# Patient Record
Sex: Female | Born: 1950 | Race: White | Hispanic: No | Marital: Married | Smoking: Former smoker
Health system: Southern US, Community
[De-identification: ages and names within clinical notes are randomized; demographics above are authoritative.]

## PROBLEM LIST (undated history)

## (undated) DIAGNOSIS — Z8719 Personal history of other diseases of the digestive system: Secondary | ICD-10-CM

## (undated) DIAGNOSIS — M25519 Pain in unspecified shoulder: Secondary | ICD-10-CM

## (undated) DIAGNOSIS — I609 Nontraumatic subarachnoid hemorrhage, unspecified: Secondary | ICD-10-CM

## (undated) DIAGNOSIS — R569 Unspecified convulsions: Secondary | ICD-10-CM

## (undated) DIAGNOSIS — F1010 Alcohol abuse, uncomplicated: Secondary | ICD-10-CM

## (undated) DIAGNOSIS — F329 Major depressive disorder, single episode, unspecified: Secondary | ICD-10-CM

## (undated) DIAGNOSIS — I639 Cerebral infarction, unspecified: Secondary | ICD-10-CM

## (undated) DIAGNOSIS — M109 Gout, unspecified: Secondary | ICD-10-CM

## (undated) DIAGNOSIS — F410 Panic disorder [episodic paroxysmal anxiety] without agoraphobia: Secondary | ICD-10-CM

## (undated) DIAGNOSIS — I6990 Unspecified sequelae of unspecified cerebrovascular disease: Secondary | ICD-10-CM

## (undated) DIAGNOSIS — M545 Low back pain, unspecified: Secondary | ICD-10-CM

## (undated) DIAGNOSIS — S22080A Wedge compression fracture of T11-T12 vertebra, initial encounter for closed fracture: Secondary | ICD-10-CM

## (undated) DIAGNOSIS — I480 Paroxysmal atrial fibrillation: Secondary | ICD-10-CM

## (undated) DIAGNOSIS — R609 Edema, unspecified: Secondary | ICD-10-CM

## (undated) DIAGNOSIS — G8929 Other chronic pain: Secondary | ICD-10-CM

## (undated) DIAGNOSIS — F321 Major depressive disorder, single episode, moderate: Secondary | ICD-10-CM

## (undated) DIAGNOSIS — M1990 Unspecified osteoarthritis, unspecified site: Secondary | ICD-10-CM

## (undated) DIAGNOSIS — I10 Essential (primary) hypertension: Secondary | ICD-10-CM

## (undated) DIAGNOSIS — R102 Pelvic and perineal pain: Secondary | ICD-10-CM

## (undated) DIAGNOSIS — G4700 Insomnia, unspecified: Secondary | ICD-10-CM

## (undated) DIAGNOSIS — M549 Dorsalgia, unspecified: Secondary | ICD-10-CM

## (undated) DIAGNOSIS — M48061 Spinal stenosis, lumbar region without neurogenic claudication: Secondary | ICD-10-CM

## (undated) DIAGNOSIS — M25569 Pain in unspecified knee: Secondary | ICD-10-CM

## (undated) DIAGNOSIS — I69351 Hemiplegia and hemiparesis following cerebral infarction affecting right dominant side: Secondary | ICD-10-CM

## (undated) DIAGNOSIS — F419 Anxiety disorder, unspecified: Secondary | ICD-10-CM

## (undated) DIAGNOSIS — I619 Nontraumatic intracerebral hemorrhage, unspecified: Secondary | ICD-10-CM

## (undated) DIAGNOSIS — I509 Heart failure, unspecified: Secondary | ICD-10-CM

## (undated) HISTORY — PX: EYE SURGERY: SHX253

## (undated) HISTORY — DX: Edema, unspecified: R60.9

## (undated) HISTORY — PX: CHOLECYSTECTOMY: SHX55

## (undated) HISTORY — DX: Alcohol abuse, uncomplicated: F10.10

## (undated) HISTORY — DX: Panic disorder (episodic paroxysmal anxiety): F41.0

## (undated) HISTORY — DX: Unspecified sequelae of unspecified cerebrovascular disease: I69.90

## (undated) HISTORY — DX: Hemiplegia and hemiparesis following cerebral infarction affecting right dominant side: I69.351

## (undated) HISTORY — DX: Essential (primary) hypertension: I10

## (undated) HISTORY — DX: Nontraumatic subarachnoid hemorrhage, unspecified: I60.9

## (undated) HISTORY — DX: Low back pain, unspecified: M54.50

## (undated) HISTORY — DX: Personal history of other diseases of the digestive system: Z87.19

## (undated) HISTORY — DX: Pain in unspecified shoulder: M25.519

## (undated) HISTORY — PX: OTHER SURGICAL HISTORY: SHX169

## (undated) HISTORY — PX: GASTRIC BYPASS: SHX52

## (undated) HISTORY — DX: Gout, unspecified: M10.9

## (undated) HISTORY — DX: Major depressive disorder, single episode, moderate: F32.1

## (undated) HISTORY — DX: Major depressive disorder, single episode, unspecified: F32.9

## (undated) HISTORY — PX: BARIATRIC SURGERY: SHX1103

## (undated) HISTORY — DX: Insomnia, unspecified: G47.00

## (undated) HISTORY — DX: Heart failure, unspecified: I50.9

## (undated) HISTORY — PX: COLONOSCOPY: SHX174

## (undated) HISTORY — DX: Unspecified osteoarthritis, unspecified site: M19.90

---
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
## 2007-03-18 ENCOUNTER — Ambulatory Visit (HOSPITAL_COMMUNITY): Admission: RE | Admit: 2007-03-18 | Discharge: 2007-03-18 | Payer: Self-pay | Admitting: Pulmonary Disease

## 2007-04-19 ENCOUNTER — Ambulatory Visit (HOSPITAL_COMMUNITY): Admission: RE | Admit: 2007-04-19 | Discharge: 2007-04-19 | Payer: Self-pay | Admitting: Pulmonary Disease

## 2007-05-28 ENCOUNTER — Ambulatory Visit: Payer: Self-pay | Admitting: Urgent Care

## 2007-06-05 ENCOUNTER — Ambulatory Visit (HOSPITAL_COMMUNITY): Admission: RE | Admit: 2007-06-05 | Discharge: 2007-06-05 | Payer: Self-pay | Admitting: Pulmonary Disease

## 2007-06-11 ENCOUNTER — Encounter: Payer: Self-pay | Admitting: Gastroenterology

## 2007-06-11 ENCOUNTER — Ambulatory Visit (HOSPITAL_COMMUNITY): Admission: RE | Admit: 2007-06-11 | Discharge: 2007-06-11 | Payer: Self-pay | Admitting: Gastroenterology

## 2007-06-11 ENCOUNTER — Ambulatory Visit: Payer: Self-pay | Admitting: Gastroenterology

## 2007-08-05 ENCOUNTER — Ambulatory Visit (HOSPITAL_COMMUNITY): Admission: RE | Admit: 2007-08-05 | Discharge: 2007-08-05 | Payer: Self-pay

## 2007-08-13 ENCOUNTER — Ambulatory Visit (HOSPITAL_COMMUNITY): Admission: RE | Admit: 2007-08-13 | Discharge: 2007-08-13 | Payer: Self-pay

## 2008-03-24 ENCOUNTER — Ambulatory Visit (HOSPITAL_COMMUNITY): Admission: RE | Admit: 2008-03-24 | Discharge: 2008-03-24 | Payer: Self-pay | Admitting: Pulmonary Disease

## 2008-03-27 ENCOUNTER — Encounter: Payer: Self-pay | Admitting: Cardiology

## 2008-03-27 ENCOUNTER — Ambulatory Visit (HOSPITAL_COMMUNITY): Admission: RE | Admit: 2008-03-27 | Discharge: 2008-03-27 | Payer: Self-pay | Admitting: Pulmonary Disease

## 2008-03-28 ENCOUNTER — Ambulatory Visit: Payer: Self-pay | Admitting: Cardiology

## 2008-03-30 ENCOUNTER — Encounter: Payer: Self-pay | Admitting: Cardiology

## 2008-03-31 ENCOUNTER — Ambulatory Visit: Payer: Self-pay | Admitting: Cardiology

## 2008-03-31 ENCOUNTER — Inpatient Hospital Stay (HOSPITAL_COMMUNITY): Admission: AD | Admit: 2008-03-31 | Discharge: 2008-04-04 | Payer: Self-pay | Admitting: Cardiology

## 2008-04-02 ENCOUNTER — Encounter: Payer: Self-pay | Admitting: Cardiology

## 2008-04-03 ENCOUNTER — Encounter: Payer: Self-pay | Admitting: Cardiology

## 2008-04-15 ENCOUNTER — Ambulatory Visit: Payer: Self-pay | Admitting: Cardiology

## 2009-01-25 ENCOUNTER — Telehealth: Payer: Self-pay | Admitting: Gastroenterology

## 2009-07-07 DIAGNOSIS — I10 Essential (primary) hypertension: Secondary | ICD-10-CM | POA: Insufficient documentation

## 2009-07-07 DIAGNOSIS — E875 Hyperkalemia: Secondary | ICD-10-CM

## 2009-07-07 DIAGNOSIS — I5032 Chronic diastolic (congestive) heart failure: Secondary | ICD-10-CM

## 2009-07-07 DIAGNOSIS — R0602 Shortness of breath: Secondary | ICD-10-CM | POA: Insufficient documentation

## 2009-07-07 DIAGNOSIS — I498 Other specified cardiac arrhythmias: Secondary | ICD-10-CM | POA: Insufficient documentation

## 2009-11-18 ENCOUNTER — Ambulatory Visit (HOSPITAL_COMMUNITY): Admission: RE | Admit: 2009-11-18 | Discharge: 2009-11-18 | Payer: Self-pay | Admitting: Pulmonary Disease

## 2009-11-19 LAB — HM MAMMOGRAPHY

## 2010-02-04 ENCOUNTER — Encounter: Payer: Self-pay | Admitting: Cardiology

## 2010-02-17 ENCOUNTER — Encounter: Payer: Self-pay | Admitting: Cardiology

## 2010-02-25 ENCOUNTER — Ambulatory Visit (HOSPITAL_COMMUNITY): Admission: RE | Admit: 2010-02-25 | Discharge: 2010-02-25 | Payer: Self-pay | Admitting: Pulmonary Disease

## 2010-03-10 ENCOUNTER — Ambulatory Visit (HOSPITAL_COMMUNITY): Admission: RE | Admit: 2010-03-10 | Discharge: 2010-03-10 | Payer: Self-pay | Admitting: Pulmonary Disease

## 2010-04-15 ENCOUNTER — Encounter: Payer: Self-pay | Admitting: Cardiology

## 2010-05-24 ENCOUNTER — Telehealth (INDEPENDENT_AMBULATORY_CARE_PROVIDER_SITE_OTHER): Payer: Self-pay | Admitting: *Deleted

## 2010-07-27 ENCOUNTER — Encounter: Payer: Self-pay | Admitting: Cardiology

## 2010-11-06 ENCOUNTER — Encounter: Payer: Self-pay | Admitting: Pulmonary Disease

## 2010-11-16 NOTE — Cardiovascular Report (Signed)
Summary: Cardiac Catheterization  Cardiac Catheterization   Imported By: Dorise Hiss 07/27/2010 14:45:33  _____________________________________________________________________  External Attachment:    Type:   Image     Comment:   External Document

## 2010-11-16 NOTE — Consult Note (Signed)
Summary: CARDIOLOGY CONSULT/ [facility]  CARDIOLOGY CONSULT/ [facility]   Imported By: Zachary George 07/26/2010 16:40:40  _____________________________________________________________________  External Attachment:    Type:   Image     Comment:   External Document

## 2010-11-16 NOTE — Progress Notes (Signed)
Summary: C/O SOB  Phone Note Call from Patient Call back at Home Phone [phone]   Caller: Patient Summary of Call: Message left on nurse voicmail that patient has been having some inceased SOB and wanted to see Degent only since she saw him at hospital. Nurse called patient back and informed her that MD was out of office and she should f/u with her PCP. Patient informed nurse that her PCP,Dr. 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 Nurse informed her to follow PCP instructions and informed her if no improvement to proceed to ED for evaluation. Appointment also given to patient to see MD.  . Initial call taken by: Carlye Grippe,  May 24, 2010 4:11 PM

## 2010-11-16 NOTE — Letter (Signed)
Summary: Appointment -missed  [facility] [address]   [place], [place] [address]   Phone: [phone]  Fax: [phone]     July 27, 2010 MRN: [id number]     [name] [address] [place], [place]  [address]     Dear Ms. Luan Pulling,  Our records indicate you missed your appointment on July 27, 2010                        with Dr.  Andee Lineman.   It is very important that we reach you to reschedule this appointment. We look forward to participating in your health care needs.   Please contact us at the number listed above at your earliest convenience to reschedule this appointment.   Sincerely,    Glass blower/designer

## 2011-02-28 NOTE — Discharge Summary (Signed)
[name], [name] NO.:  [id number]   MEDICAL RECORD NO.:  [id number]          PATIENT TYPE:  INP   LOCATION:  4735                         FACILITY:  [facility]   PHYSICIAN:  Madolyn Frieze. Jens Som, MD, FACCDATE OF BIRTH:  05-Dec-1950   DATE OF ADMISSION:  03/31/2008  DATE OF DISCHARGE:  04/03/2008                               DISCHARGE SUMMARY   PRIMARY CARDIOLOGIST:  Learta Codding, MD,FACC.   DISCHARGE DIAGNOSIS:  Acute on chronic diastolic congestive heart  failure.   SECONDARY DIAGNOSES:  1. Acute on chronic iron deficiency anemia requiring blood transfusion      at [facility] prior to transfer to [facility].  2. Hypertension.  3. Normal coronary arteries by cardiac catheterization this admission.  4. Normal left ventricular systolic function.  5. Asymptomatic bradycardia with discontinuation of beta-blocker      therapy this admission.  6. Depression.  7. Status post gastric bypass surgery.  8. Hypokalemia which has been treated this admission.   ALLERGIES:  No known drug allergies.   PROCEDURES:  Right and left heart cardiac catheterization revealing  normal coronary arteries with normal right heart pressures and mildly  elevated wedge.  Pulmonary artery pressure 34/13 (22), pulmonary  capillary wedge pressure 15, RA 11/6 (6), RV 36/1 (13), cardiac output  5.3, cardiac index 2.64.   HISTORY OF PRESENT ILLNESS:  A 60 year old female without prior cardiac  history who was in her usual state of health until approximately 1 week  prior to admission she began to note progressive dyspnea and orthopnea  as well as intermittent chest tightness.  She eventually presented to  [facility] on March 28, 2008, and was admitted for further  evaluation of dyspnea.  Cardiology was consulted on June 14.  The  patient underwent 2-D echocardiogram secondary to concern over diastolic  heart failure.  Echocardiogram revealed normal LV function,  however,  suggested RV systolic pressures of 55 to 60.  Secondary to concern over  pulmonary hypertension, it was felt the patient required right and left  heart cardiac catheterization.  Prior to transfer to [facility],  however, the patient required workup for microcytic anemia.  She did  receive blood transfusion while at [facility] and was placed on  oral iron replacement.  [name] was also noted to be bradycardic  while at [facility] on bisoprolol which she had taken  chronically, was discontinued.  She was subsequently transferred to  [facility] on June 16.   HOSPITAL COURSE:  [name] was maintained on Lasix therapy with good  response.  She was also placed on a prednisone taper for her dyspnea.  She underwent right and left heart cardiac catheterization on June 18,  and this showed normal coronary arteries with normal LV function and  normal right heart pressures with the exception of mildly elevated  pulmonary capillary wedge pressure.  There was no significant evidence  of pulmonary hypertension as was suggested on the 2-D echocardiogram at  [facility].  A VQ scan was obtained following her catheterization  and this showed  no evidence of pulmonary embolism and overall was a  normal examination.  [name] will be discharged home today in good  condition.  We will plan to follow up a basic metabolic panel in 1 week  and she will have followup with Dr. Andee Lineman in our [facility] on July 1,  at 1:45 p.m.   DISCHARGE LABORATORIES:  Hemoglobin 10.6, hematocrit 32.3, WBC 6.8,  platelets 245, D-dimer 0.37.  Sodium 140, potassium 3.4 (replaced prior  to discharge), chloride 111, CO2 25, BUN 12, creatinine 0.72, glucose  95, calcium 8.5, magnesium 2.1.  BNP 183.0, total cholesterol 150,  triglycerides 130, HDL 41, LDL 83.   DISPOSITION:  The patient is being discharged home today in good  condition.   FOLLOWUP PLANS AND APPOINTMENTS:  Follow up with Dr. Andee Lineman on  July 1,  at 1:45 p.m.  B-MET on Friday, June 26, at the [facility] in [place].   DISCHARGE MEDICATIONS:  1. Lasix 20 mg daily.  2. K-Dur 20 mEq daily.  3. Prednisone taper 30 mg June 20, 20 mg June 21, 10 mg June 22, then      discontinue.  4. Ferrous sulfate 325 mg b.i.d.  5. Colace 100 mg daily.  6. Xanax 1 mg q.i.d. p.r.n.  7. Trazodone 100 mg nightly.  8. Effexor XR 225 mg daily.   OUTSTANDING LAB STUDIES:  None.   DURATION AND DISCHARGE ENCOUNTER:  60 minutes including physician time.      Nicolasa Ducking, ANP      Madolyn Frieze. Jens Som, MD, [facility]  Electronically Signed    CB/MEDQ  D:  04/03/2008  T:  04/04/2008  Job:  [id number]

## 2011-02-28 NOTE — Consult Note (Signed)
NAMESAHANNA, EASTEP NO.:  [id number]   MEDICAL RECORD NO.:  [id number]          PATIENT TYPE:  AMB   LOCATION:  DAY                           FACILITY:  [facility]   PHYSICIAN:  Kassie Mends, M.D.      DATE OF BIRTH:  Aug 20, 1951   DATE OF CONSULTATION:  DATE OF DISCHARGE:                                 CONSULTATION   REQUESTING PHYSICIAN:  Edward L. Juanetta Gosling, M.D.   REASON FOR CONSULTATION:  Chronic diarrhea/in need of colonoscopy.   HISTORY OF PRESENT ILLNESS:  [name] is a 60 year old female who has  a longstanding history of chronic diarrhea.  She tells me for the last  five years, she has significant urgency.  She generally has loose stools  every day.  She can have anywhere from one to five stools per day.  She  denies any rectal bleeding, melena or mucus in her stools.  She denies  any abdominal pain.  She states her symptoms are worse when she has  stress or gets nervous.  She does take Imodium and her diarrhea does  seem to respond to this on an as-needed basis.  She denies any nausea or  vomiting.  Her weight has remained stable.  She takes a rare Goody's  powder, denies any NSAID use.  She takes hydrocodone as needed for her  arthritis four times a day.  She does not take any supplemental fiber,  does not complain of abdominal pain.  Her weight has been stable.  Occasionally her symptoms awaken her from sleeping.  She also tells me  she has a history of chronic anemia, etiology unknown, but do not have  any laboratory studies for reference.   PAST MEDICAL AND SURGICAL HISTORY:  1. Chronic anemia.  2. Arthritis.  3. She tells me she had a colonoscopy in the 1990s.  4. She had an EGD at [facility] which was benign.  5. She has history of chronic back pain.  6. She has had right eye surgery twice.  7. She is status post cholecystectomy.  8. She is status post a gastric bypass in the 1980s.  She tells me      about a week later, she had  a perforation which lead to respiratory      compromise.  She was coded and ended up on a ventilator.   CURRENT MEDICATIONS:  1. Ziac 5 mg daily.  2. Hydrocodone 7.5/500 q.i.d.  3. Allopurinol 300 mg daily.  4. Temazepam 15 mg nightly.  5. Xanax 0.5 mg q.i.d.  6. Goody's powders occasionally.   ALLERGIES:  NO KNOWN DRUG ALLERGIES.   FAMILY HISTORY:  Positive for mother with diarrhea predominant IBS, she  is age 2.  Father deceased secondary to Hodgkin's disease.  No family  history of colorectal carcinoma, polyps or liver disease.   SOCIAL HISTORY:  [name] is married.  She is a Licensed conveyancer at [employer].  She plans on returning to school in the fall for her  LPN.  She has a remote history  tobacco use, quit 25 years ago.  She  drinks an alcoholic beverage a month.  Denies any drug use.   REVIEW OF SYSTEMS:  See HPI.  GYNECOLOGIC:  She is postmenopausal for  about 10 years now.   PHYSICAL EXAMINATION:  VITAL SIGNS:  Weight 213 pounds, height 67  inches, temperature 97.9, blood pressure 138/80, pulse 72.  GENERAL:  [name] is a 60 year old Caucasian female who is alert,  oriented, pleasant and cooperative in no acute distress.  HEENT.  Sclerae are clear, nonicteric.  Conjunctivae are pink.  Oropharynx pink and moist without lesions.  NECK:  Supple without any mass, no thyromegaly.  HEART:  Regular rate and rhythm.  Normal S1 and S2 without murmurs,  clicks, rubs or gallops.  LUNGS:  Clear to auscultation bilaterally.  ABDOMEN:  She has a large horizontal upper abdominal scar which is  healed.  She has positive bowel sounds x4.  No bruits auscultated.  Abdomen is soft, nontender, nondistended without palpable mass or  hepatosplenomegaly.  No rebound tenderness or guarding.  Exam is limited  given the patient's body habitus.  EXTREMITIES:  Without clubbing or edema bilaterally.  SKIN:  Pink, warm and dry without any rash or jaundice.   IMPRESSION:  [name]  is a 60 year old female with a five-year history  of chronic diarrhea, with significant urgency.  Symptoms worsen with  stress.  I would suspect she has irritable bowel syndrome, but it is  possible she could have microscopic colitis and will pursue colonoscopy  with biopsies for this.   As far as her chronic anemia is concerned, I would be concerned about  pernicious  and/or iron deficiency anemia, given history of gastric  bypass.  I do not have records regarding this and I am unsure as to what  her workup has been thus far.   PLAN:  1. Colonoscopy by Dr. Cira Servant in the near future with random biopsies to      rule out microscopic colitis.  I discussed the procedure including      risks and benefits, to include but not limited to bleeding,      infection, perforation, drug reaction.  She agrees  to the plan and      consent will be obtained.  2. She can continue Imodium p.r.n. as needed for diarrhea, add a daily      fiber supplement of choice.  3. Will request most recent CBC from Dr. Juanetta Gosling office for our      review.   We would like to thank Dr. Juanetta Gosling for allowing [place] to participate in the  care of [name].      Lorenza Burton, N.P.      Kassie Mends, M.D.  Electronically Signed    KJ/MEDQ  D:  05/28/2007  T:  05/29/2007  Job:  [id number]   cc:   Ramon Dredge L. Juanetta Gosling, M.D.  Fax: [phone]

## 2011-02-28 NOTE — Assessment & Plan Note (Signed)
[facility]                          [facility] OFFICE NOTE   [name], [name]                          MRN:          [id number]  DATE:04/15/2008                            DOB:          1951/03/02    PRIMARY CARDIOLOGIST:  Learta Codding, MD, [facility]   REASON FOR VISIT:  Post-hospital followup.   [name] returns to our clinic, following recent hospitalization at  [facility] for treatment of acute/chronic diastolic CHF in the setting  of bradycardia and anemia.  Of note, she also was felt to have evidence  of significant pulmonary hypertension by 2-D echocardiography (RVSP 55-  60 mmHg), with normal ventricular function.  Subsequent coronary  angiography, however, indicated normal pulmonary artery pressures and  only mildly increased wedge pressure.  Of note, coronary arteries were  within normal limits.  Left ventricular function was normal.   Clinically, [name] reports improvement in her dyspnea.  She also  apparently had significant lower extremity edema, which is much improved  on Lasix.   Of note, the patient did require transfusion with packed RBCs, prior to  transfer.  She has microcytic anemia and apparently this has been worked  up extensively in the past.  There was even reference to a remote bone  marrow, reportedly negative.   The patient did have bradycardia and was taken off Ziac, which she was  on for treatment of hypertension.  She is currently not on any rate-  controlling medications.   Recent post-hospital blood work was ordered and revealed sodium 139,  potassium 3.1, BUN 8, and creatinine 0.8.  Interestingly, a recent  magnesium level at [facility] was normal, at 2.1.   The patient is on supplemental iron.  Her recent blood work here at  [facility], with respect to anemia, was notable for negative fecal occult  blood (x3), a low iron of 11, very low percent sat of 3, and normal  transferrin and TIBC.  Ferritin was  also low at 6.  Vitamin B12 and  folate levels were normal.   CURRENT MEDICATIONS:  1. Lasix 20 daily.  2. Potassium 20 daily.  3. Iron 325 daily.  4. Xanax 1 mg q.i.d.  5. Trazodone 100 nightly.  6. Effexor 225 daily.  7. Monthly vitamin B12 injections.   PHYSICAL EXAMINATION:  VITAL SIGNS:  Blood pressure 122/85, pulse 84 and  regular, and weight 200.  GENERAL:  A 60 year old female, moderately obese, sitting upright, in no  distress.  HEENT:  Normocephalic and atraumatic.  NECK:  Palpable carotid pulses without bruits; no JVD.  LUNGS:  Clear to auscultation in all fields.  HEART:  RRR (S1S2).  No significant murmurs.  No rubs.  ABDOMEN:  Protuberant, nontender with intact bowel sounds.  EXTREMITIES:  Right groin is stable with no ecchymosis, hematoma, or  bruit on auscultation; intact femoral and distal pulses.  NEUROLOGIC:  No focal deficit.   IMPRESSION:  1. Chronic diastolic heart failure.      a.     Normal coronary arteries by recent catheterization.  2. Multifactorial dyspnea.  a.     Suspected pulmonary hypertension (55-60 mmHg) by 2-D       echocardiography; however, normal pulmonary artery pressures by       right heart catheterization.  3. Diastolic hypertension.  4. Microcytic anemia.      a.     Status post recent red blood cell transfusion.  5. Remote tobacco.  6. Status post bradycardia, on Ziac.  7. Bipolar disorder.  8. Hypokalemia.   PLAN:  1. Recommend a repeat 2-D echo in 3 months for reassessment of      suspected pulmonary hypertension.  2. Adjust medication regimen with up titration of K-Dur 240 mEq daily      for treatment of hypokalemia, and addition of lisinopril 10 mg      daily for treatment of hypertension.  3. Followup BMET next week.  4. We will arrange a consultation with Dr. Lionel December for further      recommendations regarding her longstanding history of anemia.  5. Schedule return clinic followup with myself and Dr. Andee Lineman  in 3      months, following completion of her repeat echocardiogram.      Gene Serpe, PA-C  Electronically Signed      Learta Codding, MD,FACC  Electronically Signed   GS/MedQ  DD: 04/15/2008  DT: 04/16/2008  Job #: [id number]   cc:   Ramon Dredge L. Juanetta Gosling, M.D.  Lionel December, M.D.

## 2011-02-28 NOTE — Op Note (Signed)
[name], [name] NO.:  [id number]   MEDICAL RECORD NO.:  [id number]          PATIENT TYPE:  AMB   LOCATION:  DAY                           FACILITY:  [facility]   PHYSICIAN:  Kassie Mends, M.D.      DATE OF BIRTH:  10/19/1950   DATE OF PROCEDURE:  06/11/2007  DATE OF DISCHARGE:                               OPERATIVE REPORT   PROCEDURE:  Colonoscopy with cold forceps polypectomy and random cold  forceps biopsies.   INDICATION FOR EXAM:  [name] is a 60 year old female who last had a  colonoscopy in the 90s.  She presents for screening and evaluation for  her diarrhea.   FINDINGS:  1. A 4 mm transverse colon polyp, sessile, which was removed via cold      forceps.  Random biopsies obtained to evaluate for microscopic      colitis.  Otherwise no masses, inflammatory changes, diverticular      or AVMs seen.  2. Normal retroflexed view of the rectum.   RECOMMENDATIONS:  1. Will call [name] with results of her biopsies.  If her polyp is      adenomatous, then she will need a screening colonoscopy in 5 years,      and her siblings and children will need a screening colonoscopy      beginning at age 73 and then every 5 years.  2. She should follow a high fiber diet.  She was given a handout on      high-fiber diet and polyps.  3. No aspirin, NSAIDs or anticoagulation for 5 days.  4. Follow-up appointment in 4 weeks with Lorenza Burton, N.P. for her      diarrhea.   MEDICATIONS:  1. Demerol 125 mg IV.  2. Versed 8 mg IV.  3. Phenergan 25 mg IV.   PROCEDURE TECHNIQUE:  Physical exam was performed.  Informed consent was  obtained from the patient, after explaining the benefits, risks and  alternatives to the procedure.  The patient was connected to the monitor  and placed in the left lateral position.  Continuous oxygen was provided  by nasal cannula and IV medicine administered through an indwelling  cannula.  After administration of sedation and rectal  exam, the  patient's rectum was intubated  and the scope was advanced under direct visualization to the cecum.  The  scope was removed slowly by carefully examining the color, texture,  anatomy and integrity of the mucosa on the way out.  The patient was  recovered in Endoscopy and discharged home in satisfactory condition.      Kassie Mends, M.D.  Electronically Signed     SM/MEDQ  D:  06/11/2007  T:  06/12/2007  Job:  [id number]   cc:   Ramon Dredge L. Juanetta Gosling, M.D.  Fax: [phone]

## 2011-07-13 LAB — CBC
HCT: 31.6 — ABNORMAL LOW
Hemoglobin: 11.5 — ABNORMAL LOW
MCHC: 33.2
MCV: 78.9
MCV: 79.1
Platelets: 245
RBC: 3.95
RBC: 4.41
RDW: 18.7 — ABNORMAL HIGH
WBC: 6.8

## 2011-07-13 LAB — BASIC METABOLIC PANEL
BUN: 10
BUN: 12
CO2: 24
CO2: 26
CO2: 29
Calcium: 8.6
Chloride: 108
Chloride: 111
Chloride: 111
Chloride: 111
Creatinine, Ser: 0.72
Creatinine, Ser: 0.78
GFR calc Af Amer: >60
GFR calc Af Amer: >60
Glucose, Bld: 155 — ABNORMAL HIGH
Glucose, Bld: 95
Potassium: 2.9 — ABNORMAL LOW
Potassium: 4.3
Sodium: 140

## 2011-07-13 LAB — LIPID PANEL
Cholesterol: 150
LDL Cholesterol: 83
Total CHOL/HDL Ratio: 3.7
Triglycerides: 130

## 2011-07-13 LAB — POCT I-STAT 3, ART BLOOD GAS (G3+)
Acid-Base Excess: 2
O2 Saturation: 96
pO2, Arterial: 73 — ABNORMAL LOW

## 2011-07-13 LAB — POCT I-STAT 3, VENOUS BLOOD GAS (G3P V)
Bicarbonate: 23.8
O2 Saturation: 61
TCO2: 25
pO2, Ven: 30

## 2011-07-13 LAB — PROTIME-INR
INR: 1
INR: 1.1
Prothrombin Time: 13.7

## 2011-07-13 LAB — APTT: aPTT: 23 — ABNORMAL LOW

## 2012-03-14 ENCOUNTER — Other Ambulatory Visit (HOSPITAL_COMMUNITY): Payer: Self-pay | Admitting: Pulmonary Disease

## 2012-03-14 DIAGNOSIS — M545 Low back pain: Secondary | ICD-10-CM

## 2012-03-19 ENCOUNTER — Ambulatory Visit (HOSPITAL_COMMUNITY)
Admission: RE | Admit: 2012-03-19 | Discharge: 2012-03-19 | Disposition: A | Discharge status: Home / Self Care | Payer: 59 | Source: Ambulatory Visit | Attending: Pulmonary Disease | Admitting: Pulmonary Disease

## 2012-03-19 DIAGNOSIS — M48061 Spinal stenosis, lumbar region without neurogenic claudication: Secondary | ICD-10-CM | POA: Insufficient documentation

## 2012-03-19 DIAGNOSIS — M545 Low back pain, unspecified: Secondary | ICD-10-CM | POA: Insufficient documentation

## 2012-03-19 DIAGNOSIS — M79609 Pain in unspecified limb: Secondary | ICD-10-CM | POA: Insufficient documentation

## 2012-12-19 ENCOUNTER — Other Ambulatory Visit (HOSPITAL_COMMUNITY): Payer: Self-pay | Admitting: Internal Medicine

## 2012-12-19 ENCOUNTER — Ambulatory Visit (HOSPITAL_COMMUNITY)
Admission: RE | Admit: 2012-12-19 | Discharge: 2012-12-19 | Disposition: A | Discharge status: Home / Self Care | Payer: Self-pay | Source: Ambulatory Visit | Attending: Internal Medicine | Admitting: Internal Medicine

## 2012-12-19 DIAGNOSIS — G319 Degenerative disease of nervous system, unspecified: Secondary | ICD-10-CM | POA: Insufficient documentation

## 2012-12-19 DIAGNOSIS — R1110 Vomiting, unspecified: Secondary | ICD-10-CM | POA: Insufficient documentation

## 2012-12-19 DIAGNOSIS — R51 Headache: Secondary | ICD-10-CM

## 2013-05-15 ENCOUNTER — Ambulatory Visit (HOSPITAL_COMMUNITY)
Admission: RE | Admit: 2013-05-15 | Discharge: 2013-05-15 | Disposition: A | Discharge status: Home / Self Care | Payer: BC Managed Care – PPO | Source: Ambulatory Visit | Attending: Pulmonary Disease | Admitting: Pulmonary Disease

## 2013-05-15 ENCOUNTER — Other Ambulatory Visit (HOSPITAL_COMMUNITY): Payer: Self-pay | Admitting: Pulmonary Disease

## 2013-05-15 DIAGNOSIS — M25559 Pain in unspecified hip: Secondary | ICD-10-CM | POA: Insufficient documentation

## 2013-06-06 ENCOUNTER — Other Ambulatory Visit (HOSPITAL_COMMUNITY): Payer: Self-pay | Admitting: Pulmonary Disease

## 2013-06-06 ENCOUNTER — Ambulatory Visit (HOSPITAL_COMMUNITY)
Admission: RE | Admit: 2013-06-06 | Discharge: 2013-06-06 | Disposition: A | Discharge status: Home / Self Care | Payer: BC Managed Care – PPO | Source: Ambulatory Visit | Attending: Pulmonary Disease | Admitting: Pulmonary Disease

## 2013-06-06 DIAGNOSIS — M25562 Pain in left knee: Secondary | ICD-10-CM

## 2013-06-06 DIAGNOSIS — R0602 Shortness of breath: Secondary | ICD-10-CM

## 2013-06-06 DIAGNOSIS — M25569 Pain in unspecified knee: Secondary | ICD-10-CM | POA: Insufficient documentation

## 2013-06-24 ENCOUNTER — Ambulatory Visit (INDEPENDENT_AMBULATORY_CARE_PROVIDER_SITE_OTHER): Payer: BC Managed Care – PPO | Admitting: Orthopedic Surgery

## 2013-06-24 VITALS — BP 159/94 | Ht 66.5 in | Wt 228.0 lb

## 2013-06-24 DIAGNOSIS — M1710 Unilateral primary osteoarthritis, unspecified knee: Secondary | ICD-10-CM

## 2013-06-24 NOTE — Patient Instructions (Addendum)
Arthritis of the knee with joint fluid  Apply ice for 20 min 3 x a day   Apply topical aspercreme 3 times a day

## 2013-06-24 NOTE — Progress Notes (Signed)
Patient ID: [name], female   DOB: [birth year], 62 y.o.   MRN: [id number] Chief Complaint  Patient presents with  . Knee Pain    Left knee pain, no current injury, Consult from Dr. Juanetta Gosling    HISTORY: This is a 62 year old female with a history of lumbar disc disease requiring surgery but no surgery has been done because the patient does not have insurance she is currently disabled. She was walking a few weeks back and her knee suddenly gave way without any history of previous or immediate trauma. She complains of sharp throbbing 5/10 pain which is intermittent associated with some joint effusion it is improved with using a pillow heating pad and pain medication is worse with standing walking or standing she reported positive review of systems with weight gain and fatigue respiratory tightness GI diarrhea neurologic numbness tingling unsteady gait, psychiatric nervousness anxiety depression; hematologic easy bleeding and bruising all other systems were reviewed and were negative  She has a history of degenerative disc disease, arthritis, anemia, joint pain, chronic back pain,  She is surgery including eye surgery cholecystectomy gastric bypass wart removal from right foot  Medications Xanax 1 mg 4 times daily hydrocodone 10 mg 4 times daily Percocet lisinopril and Ambien family history heart disease and arthritis social history married does not smoke or drink  Physical Exam(12)  Vital signs: BP 159/94  Ht 5' 6.5" (1.689 m)  Wt 228 lb (103.42 kg)  BMI 36.25 kg/m2   1.GENERAL: normal development good grooming and good hygiene  2. CDV: pulses are normal mild varicose veins  3. Skin: normal in the left leg  4. Lymph: nodes were not palpable/normal left groin  5/6. Psychiatric: awake, alert and oriented, mood and affect normal   7. Neuro: normal sensation  8.   MSK  Gait: She has no noticeable limp today 9.   Inspection no medial or lateral joint line tenderness 10. Range of Motion  125 arc of flexion 11. Motor normal 12. Stability normal   Imaging osteoarthritis of left knee  Assessment: I think her leg is giving out because of her back pain her ligaments are completely stable she has no meniscal signs she does have arthritis accounting for knee joint effusion    Plan: She is uninsured so we will use a topical arthritis medicine such as Aspercreme.

## 2013-08-21 LAB — HEMOGLOBIN A1C: Hemoglobin A1C: 5.6

## 2014-08-11 ENCOUNTER — Ambulatory Visit: Payer: BC Managed Care – PPO | Admitting: Orthopedic Surgery

## 2014-08-27 ENCOUNTER — Ambulatory Visit: Payer: Self-pay | Admitting: Orthopedic Surgery

## 2014-09-17 ENCOUNTER — Ambulatory Visit: Payer: Self-pay | Admitting: Orthopedic Surgery

## 2014-09-17 ENCOUNTER — Ambulatory Visit (HOSPITAL_COMMUNITY)
Admission: RE | Admit: 2014-09-17 | Discharge: 2014-09-17 | Disposition: A | Discharge status: Home / Self Care | Payer: Medicare Other | Source: Ambulatory Visit | Attending: Orthopedic Surgery | Admitting: Orthopedic Surgery

## 2014-09-17 ENCOUNTER — Encounter: Payer: Self-pay | Admitting: Orthopedic Surgery

## 2014-09-17 ENCOUNTER — Other Ambulatory Visit: Payer: Self-pay | Admitting: Orthopedic Surgery

## 2014-09-17 DIAGNOSIS — M25561 Pain in right knee: Secondary | ICD-10-CM | POA: Insufficient documentation

## 2014-09-17 DIAGNOSIS — M25562 Pain in left knee: Secondary | ICD-10-CM | POA: Diagnosis not present

## 2014-10-15 ENCOUNTER — Telehealth: Payer: Self-pay | Admitting: Orthopedic Surgery

## 2014-10-15 NOTE — Telephone Encounter (Signed)
Patient called to confirm upcoming appointment, for 10/20/14 for knee re-evaluation; also relays that she had a fall at home, "near nightstand", and bumped her head.  I confirmed appointment and relayed to patient to contact her primary care to notify, or to go to Emergency room for this issue.  States she will call her primary care doctor.

## 2014-10-20 ENCOUNTER — Encounter: Payer: Self-pay | Admitting: Orthopedic Surgery

## 2014-10-20 ENCOUNTER — Ambulatory Visit: Payer: Medicare Other | Admitting: Orthopedic Surgery

## 2014-12-17 ENCOUNTER — Ambulatory Visit: Payer: Medicare Other | Admitting: Orthopedic Surgery

## 2014-12-21 ENCOUNTER — Ambulatory Visit (INDEPENDENT_AMBULATORY_CARE_PROVIDER_SITE_OTHER): Payer: PPO | Admitting: Orthopedic Surgery

## 2014-12-21 VITALS — Ht 66.5 in | Wt 237.8 lb

## 2014-12-21 DIAGNOSIS — M25562 Pain in left knee: Secondary | ICD-10-CM

## 2014-12-21 DIAGNOSIS — M129 Arthropathy, unspecified: Secondary | ICD-10-CM | POA: Diagnosis not present

## 2014-12-21 DIAGNOSIS — M1710 Unilateral primary osteoarthritis, unspecified knee: Secondary | ICD-10-CM

## 2014-12-21 NOTE — Patient Instructions (Signed)
Joint Injection  Care After  Refer to this sheet in the next few days. These instructions provide you with information on caring for yourself after you have had a joint injection. Your caregiver also may give you more specific instructions. Your treatment has been planned according to current medical practices, but problems sometimes occur. Call your caregiver if you have any problems or questions after your procedure.  After any type of joint injection, it is not uncommon to experience:  · Soreness, swelling, or bruising around the injection site.  · Mild numbness, tingling, or weakness around the injection site caused by the numbing medicine used before or with the injection.  It also is possible to experience the following effects associated with the specific agent after injection:  · Iodine-based contrast agents:  ¨ Allergic reaction (itching, hives, widespread redness, and swelling beyond the injection site).  · Corticosteroids (These effects are rare.):  ¨ Allergic reaction.  ¨ Increased blood sugar levels (If you have diabetes and you notice that your blood sugar levels have increased, notify your caregiver).  ¨ Increased blood pressure levels.  ¨ Mood swings.  · Hyaluronic acid in the use of viscosupplementation.  ¨ Temporary heat or redness.  ¨ Temporary rash and itching.  ¨ Increased fluid accumulation in the injected joint.  These effects all should resolve within a day after your procedure.   HOME CARE INSTRUCTIONS  · Limit yourself to light activity the day of your procedure. Avoid lifting heavy objects, bending, stooping, or twisting.  · Take prescription or over-the-counter pain medication as directed by your caregiver.  · You may apply ice to your injection site to reduce pain and swelling the day of your procedure. Ice may be applied 03-04 times:  ¨ Put ice in a plastic bag.  ¨ Place a towel between your skin and the bag.  ¨ Leave the ice on for no longer than 15-20 minutes each time.  SEEK  IMMEDIATE MEDICAL CARE IF:   · Pain and swelling get worse rather than better or extend beyond the injection site.  · Numbness does not go away.  · Blood or fluid continues to leak from the injection site.  · You have chest pain.  · You have swelling of your face or tongue.  · You have trouble breathing or you become dizzy.  · You develop a fever, chills, or severe tenderness at the injection site that last longer than 1 day.  MAKE SURE YOU:  · Understand these instructions.  · Watch your condition.  · Get help right away if you are not doing well or if you get worse.  Document Released: 06/15/2011 Document Revised: 12/25/2011 Document Reviewed: 06/15/2011  [employer]® Patient Information ©2015 [employer]. This information is not intended to replace advice given to you by your health care provider. Make sure you discuss any questions you have with your health care provider.

## 2014-12-21 NOTE — Progress Notes (Signed)
Chief Complaint  Patient presents with  . Follow-up    re eval bilateral knee pain, ref Dr Luan Pulling    Reevaluation left knee and right knee pain  Patient previously seen in 2015 around December had x-rays at that time which showed very mild arthritis of her knees she had some serious back issues going on at that point comes in today complaining of medial left knee pain no new trauma. System review no catching locking or giving way  Right knee relative asymptomatic  Knee looks good in quiet tenderness medial joint line flexion arc is 120 knee stability confirmed by drawer testing collateral ligament test strength normal skin intact pulses decent and sensation normal  Arthritis left knee recommend injection follow-up 3 months when necessary  Procedure note left knee injection verbal consent was obtained to inject left knee joint  Timeout was completed to confirm the site of injection  The medications used were 40 mg of Depo-Medrol and 1% lidocaine 3 cc  Anesthesia was provided by ethyl chloride and the skin was prepped with alcohol.  After cleaning the skin with alcohol a 20-gauge needle was used to inject the left knee joint. There were no complications. A sterile bandage was applied.

## 2015-01-20 ENCOUNTER — Ambulatory Visit (INDEPENDENT_AMBULATORY_CARE_PROVIDER_SITE_OTHER): Payer: PPO

## 2015-01-20 ENCOUNTER — Ambulatory Visit (INDEPENDENT_AMBULATORY_CARE_PROVIDER_SITE_OTHER): Payer: PPO | Admitting: Orthopedic Surgery

## 2015-01-20 VITALS — BP 155/92 | Ht 66.5 in | Wt 237.8 lb

## 2015-01-20 DIAGNOSIS — M25511 Pain in right shoulder: Secondary | ICD-10-CM | POA: Diagnosis not present

## 2015-01-20 DIAGNOSIS — S40011A Contusion of right shoulder, initial encounter: Secondary | ICD-10-CM

## 2015-01-20 DIAGNOSIS — M12511 Traumatic arthropathy, right shoulder: Secondary | ICD-10-CM

## 2015-01-20 DIAGNOSIS — M12811 Other specific arthropathies, not elsewhere classified, right shoulder: Secondary | ICD-10-CM

## 2015-01-20 DIAGNOSIS — M75101 Unspecified rotator cuff tear or rupture of right shoulder, not specified as traumatic: Secondary | ICD-10-CM | POA: Diagnosis not present

## 2015-01-20 NOTE — Progress Notes (Signed)
Patient ID: [name], female   DOB: [birth year], 64 y.o.   MRN: [id number]  Chief Complaint  Patient presents with  . Shoulder Injury    right shoulder pain s/p fall 12/21/14    64 year old female last seen a couple of years ago for her knee comes in after she fell and injured her right shoulder. Complains of painful range of motion complains of dull aching moderate to severe pain over the right shoulder and posterior shoulder joint line which is intermittent and worsened with forward elevation of the shoulder  System review related denies fever area denies numbness. Denies skin changes such as rash.  BP 155/92 mmHg  Ht 5' 6.5" (1.689 m)  Wt 237 lb 12.8 oz (107.865 kg)  BMI 37.81 kg/m2 Vital signs stable as recorded and reviewed. Patient's body habitus is endomorphic. She is oriented 3 mood is pleasant.  Gait unremarkable and noncontributory. Right shoulder examination reveals tenderness over the posterior joint line of the posterior part of her arm no joint line tenderness anteriorly rotator interval nontender active range of motion is limited to 90 passive range of motion is 180 with pain range of motion throughout the arc of 90-180. Stability is normal. Motor exam remains intact drop test was negative for rupture. Skin was warm dry and intact without redness or rash. Sensation was normal in the right upper extremity she had normal radial artery pulse and normal ulnar artery pulse. She has normal range of motion of the left shoulder without pain tenderness swelling or weakness  X-rays show proximal migration of the humerus with contact apparent between the humeral head and undersurface of the acromion to suggest chronic rotator cuff disease  Encounter Diagnoses  Name Primary?  . Right shoulder pain Yes  . Rotator cuff tear arthropathy, right   . Rotator cuff syndrome of right shoulder   . Contusion of shoulder, right, initial encounter     At this point I think she probably has an  underlying rotator cuff tear arthropathy with an acute contusion  I injected the right subacromial space started her on a [employer] exercise program and gave her pain medication to take for 6 weeks.   Procedure note the subacromial injection shoulder RIGHT  Verbal consent was obtained to inject the  RIGHT   Shoulder  Timeout was completed to confirm the injection site is a subacromial space of the  RIGHT  shoulder   Medication used Depo-Medrol 40 mg and lidocaine 1% 3 cc  Anesthesia was provided by ethyl chloride  The injection was performed in the RIGHT  posterior subacromial space. After pinning the skin with alcohol and anesthetized the skin with ethyl chloride the subacromial space was injected using a 20-gauge needle. There were no complications  Sterile dressing was applied.   No orders of the defined types were placed in this encounter.

## 2015-01-20 NOTE — Patient Instructions (Signed)
Follow up in 2 months  Do home exercises  Takes Tylenol #3.

## 2015-01-21 ENCOUNTER — Encounter: Payer: Self-pay | Admitting: Orthopedic Surgery

## 2015-01-21 MED ORDER — ACETAMINOPHEN-CODEINE #3 300-30 MG PO TABS: 1.0000 | ORAL_TABLET | Freq: Four times a day (QID) | ORAL | Status: DC | PRN

## 2015-03-23 ENCOUNTER — Ambulatory Visit (INDEPENDENT_AMBULATORY_CARE_PROVIDER_SITE_OTHER): Payer: PPO | Admitting: Orthopedic Surgery

## 2015-03-23 VITALS — BP 174/94 | Ht 66.5 in | Wt 240.0 lb

## 2015-03-23 DIAGNOSIS — M129 Arthropathy, unspecified: Secondary | ICD-10-CM

## 2015-03-23 DIAGNOSIS — S83242S Other tear of medial meniscus, current injury, left knee, sequela: Secondary | ICD-10-CM

## 2015-03-23 DIAGNOSIS — M75101 Unspecified rotator cuff tear or rupture of right shoulder, not specified as traumatic: Secondary | ICD-10-CM

## 2015-03-23 DIAGNOSIS — M1710 Unilateral primary osteoarthritis, unspecified knee: Secondary | ICD-10-CM

## 2015-03-23 NOTE — Progress Notes (Signed)
Patient ID: [name], female   DOB: [birth year], 65 y.o.   MRN: [id number] Chief Complaint  Patient presents with  . Follow-up    2 month recheck Rt shoulder s/p injection + home exercises    The patient reports injection in the right shoulder improved her right shoulder symptoms but she is complaining of knee pain I last saw her for her knee in 2015 we gave her an injection she had an x-ray show mild arthritis. She said she reinjured the left knee in that she twisted it after she got her shoulder injection complains of medial knee pain over the medial joint with instability and giving way symptoms no catching or locking  Review of systems is negative for fever or chills rash over the left knee. She does have ongoing back problems back pain and radicular symptoms at times  She has tenderness over the medial compartment of the knee her range of motion remains intact she has knee stability without compromise or motor exam is normal her skin is warm to touch without erythema in her extremities warm without vascular compromise sensation remains intact she is ambulatory with a cane  Recommend MRI left knee to rule out any meniscal tear. Her last x-ray was reviewed again and showed mild arthritis

## 2015-03-23 NOTE — Patient Instructions (Signed)
We will schedule MRI for you and call you with appt and results 

## 2015-04-02 ENCOUNTER — Ambulatory Visit (HOSPITAL_COMMUNITY)
Admission: RE | Admit: 2015-04-02 | Discharge: 2015-04-02 | Disposition: A | Discharge status: Home / Self Care | Payer: PPO | Source: Ambulatory Visit | Attending: Orthopedic Surgery | Admitting: Orthopedic Surgery

## 2015-04-02 DIAGNOSIS — M25562 Pain in left knee: Secondary | ICD-10-CM | POA: Insufficient documentation

## 2015-04-02 DIAGNOSIS — S83242S Other tear of medial meniscus, current injury, left knee, sequela: Secondary | ICD-10-CM

## 2015-04-12 ENCOUNTER — Telehealth: Payer: Self-pay | Admitting: Orthopedic Surgery

## 2015-04-12 NOTE — Telephone Encounter (Signed)
Patient is calling requesting MRI results of Left Knee done 04/02/15, please advise?

## 2015-04-13 ENCOUNTER — Telehealth: Payer: Self-pay | Admitting: Orthopedic Surgery

## 2015-04-13 NOTE — Telephone Encounter (Signed)
PER DR HARRISON ADVISE PATIENT MRI SHOW ARTHRITIS ONLY, NO SURGERY NEEDED  CALLED PATIENT, NO ANSWER

## 2015-04-15 ENCOUNTER — Encounter: Payer: Self-pay | Admitting: *Deleted

## 2015-04-15 NOTE — Telephone Encounter (Signed)
This encounter was created in error - please disregard.

## 2015-04-16 NOTE — Telephone Encounter (Signed)
Called will check in again

## 2015-04-29 ENCOUNTER — Ambulatory Visit (INDEPENDENT_AMBULATORY_CARE_PROVIDER_SITE_OTHER): Payer: PPO | Admitting: Orthopedic Surgery

## 2015-04-29 VITALS — BP 167/96 | Ht 67.0 in | Wt 240.0 lb

## 2015-04-29 DIAGNOSIS — M129 Arthropathy, unspecified: Secondary | ICD-10-CM | POA: Diagnosis not present

## 2015-04-29 DIAGNOSIS — M1710 Unilateral primary osteoarthritis, unspecified knee: Secondary | ICD-10-CM

## 2015-04-29 NOTE — Progress Notes (Signed)
Patient ID: [name], female   DOB: 04-18-1951, 64 y.o.   MRN: [id number]  Follow up visit  Chief Complaint  Patient presents with  . Follow-up    follow up left knee pain s/p MRI    BP 167/96 mmHg  Ht 5\' 7"  (1.702 m)  Wt 240 lb (108.863 kg)  BMI 37.58 kg/m2  Encounter Diagnosis  Name Primary?  Marland Kitchen Arthritis of knee Yes       She had a MRI of her knee it showed arthritis no tear she's had one injection I would like to repeat it she still having medial pain  I talked to her about possible knee replacement  Review of Systems  Constitutional: Negative for fever.  Musculoskeletal: Positive for back pain.    We injected her knee follow-up 3 months  Procedure note left knee injection verbal consent was obtained to inject left knee joint  Timeout was completed to confirm the site of injection  The medications used were 40 mg of Depo-Medrol and 1% lidocaine 3 cc  Anesthesia was provided by ethyl chloride and the skin was prepped with alcohol.  After cleaning the skin with alcohol a 20-gauge needle was used to inject the left knee joint. There were no complications. A sterile bandage was applied.

## 2015-04-29 NOTE — Patient Instructions (Signed)
Joint Injection  Care After  Refer to this sheet in the next few days. These instructions provide you with information on caring for yourself after you have had a joint injection. Your caregiver also may give you more specific instructions. Your treatment has been planned according to current medical practices, but problems sometimes occur. Call your caregiver if you have any problems or questions after your procedure.  After any type of joint injection, it is not uncommon to experience:  · Soreness, swelling, or bruising around the injection site.  · Mild numbness, tingling, or weakness around the injection site caused by the numbing medicine used before or with the injection.  It also is possible to experience the following effects associated with the specific agent after injection:  · Iodine-based contrast agents:  ¨ Allergic reaction (itching, hives, widespread redness, and swelling beyond the injection site).  · Corticosteroids (These effects are rare.):  ¨ Allergic reaction.  ¨ Increased blood sugar levels (If you have diabetes and you notice that your blood sugar levels have increased, notify your caregiver).  ¨ Increased blood pressure levels.  ¨ Mood swings.  · Hyaluronic acid in the use of viscosupplementation.  ¨ Temporary heat or redness.  ¨ Temporary rash and itching.  ¨ Increased fluid accumulation in the injected joint.  These effects all should resolve within a day after your procedure.   HOME CARE INSTRUCTIONS  · Limit yourself to light activity the day of your procedure. Avoid lifting heavy objects, bending, stooping, or twisting.  · Take prescription or over-the-counter pain medication as directed by your caregiver.  · You may apply ice to your injection site to reduce pain and swelling the day of your procedure. Ice may be applied 03-04 times:  ¨ Put ice in a plastic bag.  ¨ Place a towel between your skin and the bag.  ¨ Leave the ice on for no longer than 15-20 minutes each time.  SEEK  IMMEDIATE MEDICAL CARE IF:   · Pain and swelling get worse rather than better or extend beyond the injection site.  · Numbness does not go away.  · Blood or fluid continues to leak from the injection site.  · You have chest pain.  · You have swelling of your face or tongue.  · You have trouble breathing or you become dizzy.  · You develop a fever, chills, or severe tenderness at the injection site that last longer than 1 day.  MAKE SURE YOU:  · Understand these instructions.  · Watch your condition.  · Get help right away if you are not doing well or if you get worse.  Document Released: 06/15/2011 Document Revised: 12/25/2011 Document Reviewed: 06/15/2011  [employer]® Patient Information ©2015 [employer]. This information is not intended to replace advice given to you by your health care provider. Make sure you discuss any questions you have with your health care provider.

## 2015-06-22 ENCOUNTER — Encounter: Payer: Self-pay | Admitting: Family Medicine

## 2015-07-29 ENCOUNTER — Ambulatory Visit: Payer: PPO | Admitting: Orthopedic Surgery

## 2015-08-24 ENCOUNTER — Ambulatory Visit (INDEPENDENT_AMBULATORY_CARE_PROVIDER_SITE_OTHER): Payer: PPO | Admitting: Orthopedic Surgery

## 2015-08-24 VITALS — BP 122/86 | Ht 67.0 in | Wt 240.0 lb

## 2015-08-24 DIAGNOSIS — M48061 Spinal stenosis, lumbar region without neurogenic claudication: Secondary | ICD-10-CM

## 2015-08-24 DIAGNOSIS — M4806 Spinal stenosis, lumbar region: Secondary | ICD-10-CM | POA: Diagnosis not present

## 2015-08-24 NOTE — Patient Instructions (Signed)
We will refer you to Dr Lyla Son for [employer]

## 2015-08-24 NOTE — Progress Notes (Signed)
The patient comes in for routine follow-up visit she had an injection in her knee and she got improvement but she has this ongoing back condition. Her MRI showed arthritis without tear we repeated her injection that we did 3 months prior to the last visit and she comes in today complaining of inability to stand up. Lateral lower leg pain anterior compartment. Ongoing back pain.   Review of systems bowel and bladder function normal  Exam she is ambulating with a cane BP 122/86 mmHg  Ht 5\' 7"  (1.702 m)  Wt 240 lb (108.863 kg)  BMI 37.58 kg/m2 She is awake alert and oriented 3 mood and affect are flat. She has tenderness in her lumbar spine central and left side almost jumped out of the chair when I palpated. She has decreased range of motion in the lumbar spine which is global  She has no gross motor weakness   I went back and looked at her imaging and she's had MRI in 2008 as well as 2013 and she has significant spondylosis and spinal stenosis  At this point I would recommend that she see Dr. Jenetta Downer for possible injections in the lumbar spine. I'm not convinced that her knee is really a problem at this point and have released her from care regarding that.

## 2015-09-30 ENCOUNTER — Encounter (INDEPENDENT_AMBULATORY_CARE_PROVIDER_SITE_OTHER): Payer: Self-pay | Admitting: *Deleted

## 2015-10-19 DIAGNOSIS — M545 Low back pain: Secondary | ICD-10-CM | POA: Diagnosis not present

## 2015-10-19 DIAGNOSIS — M4806 Spinal stenosis, lumbar region: Secondary | ICD-10-CM | POA: Diagnosis not present

## 2015-10-19 DIAGNOSIS — Z79891 Long term (current) use of opiate analgesic: Secondary | ICD-10-CM | POA: Diagnosis not present

## 2015-10-19 DIAGNOSIS — M25562 Pain in left knee: Secondary | ICD-10-CM | POA: Diagnosis not present

## 2015-10-19 DIAGNOSIS — M47817 Spondylosis without myelopathy or radiculopathy, lumbosacral region: Secondary | ICD-10-CM | POA: Diagnosis not present

## 2015-10-19 DIAGNOSIS — M5136 Other intervertebral disc degeneration, lumbar region: Secondary | ICD-10-CM | POA: Diagnosis not present

## 2015-10-19 DIAGNOSIS — M25511 Pain in right shoulder: Secondary | ICD-10-CM | POA: Diagnosis not present

## 2015-10-25 DIAGNOSIS — R197 Diarrhea, unspecified: Secondary | ICD-10-CM | POA: Diagnosis not present

## 2015-10-25 DIAGNOSIS — M545 Low back pain: Secondary | ICD-10-CM | POA: Diagnosis not present

## 2015-10-25 DIAGNOSIS — M1990 Unspecified osteoarthritis, unspecified site: Secondary | ICD-10-CM | POA: Diagnosis not present

## 2015-10-25 DIAGNOSIS — F419 Anxiety disorder, unspecified: Secondary | ICD-10-CM | POA: Diagnosis not present

## 2015-10-28 DIAGNOSIS — M4806 Spinal stenosis, lumbar region: Secondary | ICD-10-CM | POA: Diagnosis not present

## 2015-10-28 DIAGNOSIS — M6281 Muscle weakness (generalized): Secondary | ICD-10-CM | POA: Diagnosis not present

## 2015-10-28 DIAGNOSIS — M545 Low back pain: Secondary | ICD-10-CM | POA: Diagnosis not present

## 2015-10-28 DIAGNOSIS — M47816 Spondylosis without myelopathy or radiculopathy, lumbar region: Secondary | ICD-10-CM | POA: Diagnosis not present

## 2015-10-29 ENCOUNTER — Ambulatory Visit (INDEPENDENT_AMBULATORY_CARE_PROVIDER_SITE_OTHER): Payer: PPO | Admitting: Internal Medicine

## 2015-11-01 ENCOUNTER — Telehealth (INDEPENDENT_AMBULATORY_CARE_PROVIDER_SITE_OTHER): Payer: Self-pay | Admitting: *Deleted

## 2015-11-01 ENCOUNTER — Encounter (INDEPENDENT_AMBULATORY_CARE_PROVIDER_SITE_OTHER): Payer: Self-pay | Admitting: Internal Medicine

## 2015-11-01 ENCOUNTER — Ambulatory Visit (INDEPENDENT_AMBULATORY_CARE_PROVIDER_SITE_OTHER): Payer: PPO | Admitting: Internal Medicine

## 2015-11-01 ENCOUNTER — Other Ambulatory Visit (INDEPENDENT_AMBULATORY_CARE_PROVIDER_SITE_OTHER): Payer: Self-pay | Admitting: Internal Medicine

## 2015-11-01 VITALS — BP 144/80 | HR 72 | Temp 97.6°F | Ht 67.0 in | Wt 240.7 lb

## 2015-11-01 DIAGNOSIS — R197 Diarrhea, unspecified: Secondary | ICD-10-CM | POA: Diagnosis not present

## 2015-11-01 DIAGNOSIS — Z1211 Encounter for screening for malignant neoplasm of colon: Secondary | ICD-10-CM

## 2015-11-01 MED ORDER — DICYCLOMINE HCL 20 MG PO TABS: 20.0000 mg | ORAL_TABLET | Freq: Two times a day (BID) | ORAL | Status: DC

## 2015-11-01 NOTE — Progress Notes (Signed)
Subjective:    Patient ID: [name], female    DOB: May 25, 1951, 65 y.o.   MRN: [id number]  HPI Referred to our office by Dr. Juanetta Gosling for chronic diarrhea. She tells the diarrhea started around October. She will have diarrhea at least 3 times a week. She says her stools are gray when she has diarrhea. When she has diarrhea, she will have at least 8 stools a day. On some days her stools are formed. No melena or BRRB.  She does have urgency when she has diarrhea. She is taking Lomotil four times a day when she has diarrhea.    She underwent a colonoscopy in 2008 by Dr. Darrick Penna for same.   Stools studies from Dr. Juanetta Gosling were negative. Her appetite is good. No weight loss. She has gained 40 pounds since she retired from [employer] in 2012. She denies any fever.  She goes to physical therapy x 3 a week for her back.  EGD in 2016 for epigastric pain by Dr Gabriel Cirri and it was normal.  She takes Marlin Canary Powder's daily for headaches.       08/28/2015 Stools studies were negative.   08/27/2015 ALP 85, AST 19.8, total bili 0.2, ALT 10 H and H 10.3 AND 34.1, MCV 82.8, Platelet ct 281.    06/11/2007 Colonoscopy with cold forceps polypectomy and random cold forceps biopsies.  INDICATION FOR EXAM: Ms. Belmarez is a 65 year old female who last had a colonoscopy in the 90s. She presents for screening and evaluation for her diarrhea.  FINDINGS: 1. A 4 mm transverse colon polyp, sessile, which was removed via cold  forceps. Random biopsies obtained to evaluate for microscopic  colitis. Otherwise no masses, inflammatory changes, diverticular  or AVMs seen. 2. Normal retroflexed view of the rectum.  Biopsy  1. COLON, TRANSVERSE, POLYPS: FRAGMENTS OF POLYPOID COLONIC MUCOSA. THERE IS NO EVIDENCE OF COLITIS, DYSPLASIA, OR MALIGNANCY.  2. COLON, RANDOM, BIOPSY: FRAGMENTS OF COLONIC MUCOSA WITH A FEW SCATTERED LYMPHOID AGGREGATES. THERE IS NO EVIDENCE  OF COLITIS, DYSPLASIA, OR MALIGNANCY.  Review of Systems Past Medical History  Diagnosis Date  . Depression   . CHF (congestive heart failure) (HCC)   . Arthritis   . Hypertension     Past Surgical History  Procedure Laterality Date  . Eye surgery      as a child  . Cholecystectomy    . Gastric bypass      in 1985 for obesity    No Known Allergies  Current Outpatient Prescriptions on File Prior to Visit  Medication Sig Dispense Refill  . ALPRAZolam (XANAX) 1 MG tablet Take 1 mg by mouth 4 (four) times daily as needed for anxiety.    . benztropine (COGENTIN) 1 MG tablet Take 1 mg by mouth 2 (two) times daily.    . trazodone (DESYREL) 300 MG tablet Take 300 mg by mouth at bedtime. Reported on 11/01/2015    . risperiDONE (RISPERDAL) 1 MG tablet Take 1 mg by mouth daily.     No current facility-administered medications on file prior to visit.        Objective:   Physical Exam  Blood pressure 144/80, pulse 72, temperature 97.6 F (36.4 C), height 5\' 7"  (1.702 m), weight 240 lb 11.2 oz (109.181 kg). Alert and oriented. Skin warm and dry. Oral mucosa is moist.   . Sclera anicteric, conjunctivae is pink. Thyroid not enlarged. No cervical lymphadenopathy. Lungs clear. Heart regular rate and rhythm.  Abdomen is soft. Bowel sounds are  positive. No hepatomegaly. No abdominal masses felt. No tenderness.  No edema to lower extremities.         Assessment & Plan:  Diarrhea. Am going to start her on Dicyclomine 20mg  BID. Imodium BID. Will schedule colonoscopy to be sure she doesn't have a colitis.  Stop the Good Powder's/

## 2015-11-01 NOTE — Telephone Encounter (Signed)
Patient needs trilyte 

## 2015-11-01 NOTE — Patient Instructions (Addendum)
Dicyclomine BID Imodium twice a day. Colonoscopy. The risks and benefits such as perforation, bleeding, and infection were reviewed with the patient and is agreeable. Stop the [employer]

## 2015-11-02 MED ORDER — PEG 3350-KCL-NA BICARB-NACL 420 G PO SOLR: 4000.0000 mL | Freq: Once | ORAL | Status: DC

## 2015-11-08 DIAGNOSIS — G4700 Insomnia, unspecified: Secondary | ICD-10-CM | POA: Diagnosis not present

## 2015-11-08 DIAGNOSIS — M545 Low back pain: Secondary | ICD-10-CM | POA: Diagnosis not present

## 2015-11-08 DIAGNOSIS — M4806 Spinal stenosis, lumbar region: Secondary | ICD-10-CM | POA: Diagnosis not present

## 2015-11-08 DIAGNOSIS — Z79899 Other long term (current) drug therapy: Secondary | ICD-10-CM | POA: Diagnosis not present

## 2015-11-08 DIAGNOSIS — M1990 Unspecified osteoarthritis, unspecified site: Secondary | ICD-10-CM | POA: Diagnosis not present

## 2015-11-08 DIAGNOSIS — Z79891 Long term (current) use of opiate analgesic: Secondary | ICD-10-CM | POA: Diagnosis not present

## 2015-11-08 DIAGNOSIS — Z809 Family history of malignant neoplasm, unspecified: Secondary | ICD-10-CM | POA: Diagnosis not present

## 2015-11-08 DIAGNOSIS — M47816 Spondylosis without myelopathy or radiculopathy, lumbar region: Secondary | ICD-10-CM | POA: Diagnosis not present

## 2015-11-08 DIAGNOSIS — M5136 Other intervertebral disc degeneration, lumbar region: Secondary | ICD-10-CM | POA: Diagnosis not present

## 2015-11-08 DIAGNOSIS — F419 Anxiety disorder, unspecified: Secondary | ICD-10-CM | POA: Diagnosis not present

## 2015-11-08 DIAGNOSIS — M25511 Pain in right shoulder: Secondary | ICD-10-CM | POA: Diagnosis not present

## 2015-11-08 DIAGNOSIS — M47817 Spondylosis without myelopathy or radiculopathy, lumbosacral region: Secondary | ICD-10-CM | POA: Diagnosis not present

## 2015-11-08 DIAGNOSIS — M25562 Pain in left knee: Secondary | ICD-10-CM | POA: Diagnosis not present

## 2015-11-08 DIAGNOSIS — Z9884 Bariatric surgery status: Secondary | ICD-10-CM | POA: Diagnosis not present

## 2015-11-18 DIAGNOSIS — M47896 Other spondylosis, lumbar region: Secondary | ICD-10-CM | POA: Diagnosis not present

## 2015-11-18 DIAGNOSIS — M545 Low back pain: Secondary | ICD-10-CM | POA: Diagnosis not present

## 2015-11-18 DIAGNOSIS — M6281 Muscle weakness (generalized): Secondary | ICD-10-CM | POA: Diagnosis not present

## 2015-11-19 ENCOUNTER — Encounter: Payer: Self-pay | Admitting: Obstetrics and Gynecology

## 2015-11-19 ENCOUNTER — Ambulatory Visit (INDEPENDENT_AMBULATORY_CARE_PROVIDER_SITE_OTHER): Payer: PPO | Admitting: Obstetrics and Gynecology

## 2015-11-19 VITALS — BP 150/80 | Ht 67.0 in | Wt 240.0 lb

## 2015-11-19 DIAGNOSIS — R102 Pelvic and perineal pain: Secondary | ICD-10-CM | POA: Insufficient documentation

## 2015-11-19 NOTE — Progress Notes (Signed)
Patient ID: [name], female   DOB: 1951/07/15, 65 y.o.   MRN: [id number]   [facility] Visit  Patient name: [name] MRN [id number]  Date of birth: 02/18/1951  CC & HPI:  [name] is a 65 y.o. female presenting today for abnormal vaginal discharge with dark grey coloring for years. She states [facility]. Pt states she sees the discharge in her underwear, but does not see the discharge when she wipes or wears a pad. She also reports intermittent, sharp "gnawing" vaginal pain, lasting 5 minutes at a time. Pt states she is not aware what brings on her pain and notes it occasionally wakes her up at night.  She notes the pain is unaffected by bowel movements. Pt also notes she experiences a sensation that she needs to urinate with the pain. She denies dysuria, abdominal pain. She takes 10 mg Hydrocodone 4x/day for chronic back pain and notes this causes her constipation. Pt is followed by pain management. No h/o hysterectomy.   ROS:  A complete 10 system review of systems was obtained and all systems are negative except as noted in the HPI and PMH.    Pertinent History Reviewed:   Reviewed: Significant for CHF, HTN Medical         Past Medical History  Diagnosis Date  . Depression   . CHF (congestive heart failure) (HCC)   . Arthritis   . Hypertension                               Surgical Hx:    Past Surgical History  Procedure Laterality Date  . Eye surgery      as a child  . Cholecystectomy    . Gastric bypass      in 1985 for obesity   Medications: Reviewed & Updated - see associated section                       Current outpatient prescriptions:  .  ALPRAZolam (XANAX) 1 MG tablet, Take 1 mg by mouth 4 (four) times daily as needed for anxiety., Disp: , Rfl:  .  benztropine (COGENTIN) 1 MG tablet, Take 1 mg by mouth 2 (two) times daily., Disp: , Rfl:  .  dicyclomine (BENTYL) 20 MG tablet, Take 1 tablet (20 mg  total) by mouth 2 (two) times daily before a meal., Disp: 60 tablet, Rfl: 4 .  diphenoxylate-atropine (LOMOTIL) 2.5-0.025 MG tablet, Take by mouth 4 (four) times daily as needed for diarrhea or loose stools., Disp: , Rfl:  .  promethazine (PHENERGAN) 25 MG tablet, Take 25 mg by mouth every 6 (six) hours as needed for nausea or vomiting., Disp: , Rfl:  .  risperiDONE (RISPERDAL) 1 MG tablet, Take 1 mg by mouth daily., Disp: , Rfl:  .  trazodone (DESYREL) 300 MG tablet, Take 300 mg by mouth at bedtime. Reported on 11/01/2015, Disp: , Rfl:    Social History: Reviewed -  reports that she has never smoked. She does not have any smokeless tobacco history on file.  Objective Findings:  Vitals: Blood pressure 150/80, height 5\' 7"  (1.702 m), weight 240 lb (108.863 kg). GENERAL: Well-developed, well-nourished female in no acute distress.  HEENT: Normocephalic, atraumatic. Sclerae anicteric.  NECK: Supple. Normal thyroid.  LUNGS: Clear to auscultation bilaterally.  HEART: Regular rate and rhythm. BREASTS:  Symmetric in size. No masses, skin changes, nipple drainage, or lymphadenopathy. ABDOMEN: Soft, nontender, nondistended. No organomegaly. PELVIC: Normal external female genitalia. Normal cervix contour. Uterus is normal in size. No adnexal mass or tenderness.  VAGINA: Vagina is pink and rugated.  Normal discharge. Atrophic vaginal tissues with adequate vaginal support.  RECTAL: normal sphincter tone. Guaiac negative  EXTREMITIES: No cyanosis, clubbing, or edema, 2+ distal pulses.   Assessment & Plan:   A:  1. Intermittent vaginal pain with no known modifiers  2. Abnormal discharge for years  3. No physical findings indicative for pain  4. Guaiac negative   P:  1. Advised pt to keep a journal of vaginal pains 2. Will order pelvic US   By signing my name below, I, [name], attest that this documentation has been prepared under the direction and in the presence of Tilda Burrow,  MD. Electronically Signed: [name], ED Scribe. 11/19/2015. 11:54 AM.  I personally performed the services described in this documentation, which was SCRIBED in my presence. The recorded information has been reviewed and considered accurate. It has been edited as necessary during review. Tilda Burrow, MD

## 2015-11-19 NOTE — Progress Notes (Signed)
Patient ID: [name], female   DOB: Aug 30, 1951, 65 y.o.   MRN: [id number] Pt here today for abnormal spotting. Pt states that she has a stain in her underwear for about 6 years. Pt states that she doesn't have any bleeding when wiping or when wearing a pad.

## 2015-12-02 ENCOUNTER — Encounter (HOSPITAL_COMMUNITY)
Admission: RE | Disposition: A | Discharge status: Home / Self Care | Payer: Self-pay | Source: Ambulatory Visit | Attending: Internal Medicine

## 2015-12-02 ENCOUNTER — Encounter (HOSPITAL_COMMUNITY): Payer: Self-pay | Admitting: *Deleted

## 2015-12-02 ENCOUNTER — Ambulatory Visit (HOSPITAL_COMMUNITY)
Admission: RE | Admit: 2015-12-02 | Discharge: 2015-12-02 | Disposition: A | Discharge status: Home / Self Care | Payer: PPO | Source: Ambulatory Visit | Attending: Internal Medicine | Admitting: Internal Medicine

## 2015-12-02 DIAGNOSIS — Z79899 Other long term (current) drug therapy: Secondary | ICD-10-CM | POA: Diagnosis not present

## 2015-12-02 DIAGNOSIS — M1990 Unspecified osteoarthritis, unspecified site: Secondary | ICD-10-CM | POA: Insufficient documentation

## 2015-12-02 DIAGNOSIS — K648 Other hemorrhoids: Secondary | ICD-10-CM | POA: Insufficient documentation

## 2015-12-02 DIAGNOSIS — F329 Major depressive disorder, single episode, unspecified: Secondary | ICD-10-CM | POA: Diagnosis not present

## 2015-12-02 DIAGNOSIS — D123 Benign neoplasm of transverse colon: Secondary | ICD-10-CM | POA: Diagnosis not present

## 2015-12-02 DIAGNOSIS — I10 Essential (primary) hypertension: Secondary | ICD-10-CM | POA: Insufficient documentation

## 2015-12-02 DIAGNOSIS — F419 Anxiety disorder, unspecified: Secondary | ICD-10-CM | POA: Insufficient documentation

## 2015-12-02 DIAGNOSIS — Z9884 Bariatric surgery status: Secondary | ICD-10-CM | POA: Diagnosis not present

## 2015-12-02 DIAGNOSIS — R197 Diarrhea, unspecified: Secondary | ICD-10-CM | POA: Diagnosis not present

## 2015-12-02 DIAGNOSIS — Z7982 Long term (current) use of aspirin: Secondary | ICD-10-CM | POA: Insufficient documentation

## 2015-12-02 HISTORY — PX: BIOPSY: SHX5522

## 2015-12-02 HISTORY — DX: Anxiety disorder, unspecified: F41.9

## 2015-12-02 HISTORY — PX: COLONOSCOPY: SHX5424

## 2015-12-02 HISTORY — PX: POLYPECTOMY: SHX5525

## 2015-12-02 SURGERY — COLONOSCOPY
Anesthesia: Moderate Sedation

## 2015-12-02 MED ORDER — PROMETHAZINE HCL 25 MG/ML IJ SOLN
INTRAMUSCULAR | Status: DC | PRN
  Administered 2015-12-02 (×4): 25 mg via INTRAVENOUS

## 2015-12-02 MED ORDER — LOPERAMIDE HCL 2 MG PO CAPS: 2.0000 mg | ORAL_CAPSULE | Freq: Two times a day (BID) | ORAL | Status: DC

## 2015-12-02 MED ORDER — MIDAZOLAM HCL 5 MG/5ML IJ SOLN
INTRAMUSCULAR | Status: AC
  Filled 2015-12-02: qty 5

## 2015-12-02 MED ORDER — BENEFIBER DRINK MIX PO PACK: 4.0000 g | PACK | Freq: Every day | ORAL | Status: DC

## 2015-12-02 MED ORDER — PROMETHAZINE HCL 25 MG/ML IJ SOLN
INTRAMUSCULAR | Status: AC
  Filled 2015-12-02: qty 1

## 2015-12-02 MED ORDER — MIDAZOLAM HCL 5 MG/5ML IJ SOLN
INTRAMUSCULAR | Status: DC | PRN
  Administered 2015-12-02: 3 mg via INTRAVENOUS
  Administered 2015-12-02 (×2): 2 mg via INTRAVENOUS
  Administered 2015-12-02 (×2): 3 mg via INTRAVENOUS
  Administered 2015-12-02: 2 mg via INTRAVENOUS

## 2015-12-02 MED ORDER — MIDAZOLAM HCL 5 MG/5ML IJ SOLN
INTRAMUSCULAR | Status: AC
  Filled 2015-12-02: qty 10

## 2015-12-02 MED ORDER — STERILE WATER FOR IRRIGATION IR SOLN
Status: DC | PRN
  Administered 2015-12-02: 13:00:00

## 2015-12-02 MED ORDER — SODIUM CHLORIDE 0.9 % IV SOLN
INTRAVENOUS | Status: DC
  Administered 2015-12-02: 13:00:00 via INTRAVENOUS

## 2015-12-02 MED ORDER — SODIUM CHLORIDE 0.9% FLUSH
INTRAVENOUS | Status: AC
  Filled 2015-12-02: qty 10

## 2015-12-02 MED ORDER — MEPERIDINE HCL 50 MG/ML IJ SOLN
INTRAMUSCULAR | Status: AC
  Filled 2015-12-02: qty 1

## 2015-12-02 MED ORDER — MEPERIDINE HCL 50 MG/ML IJ SOLN
INTRAMUSCULAR | Status: DC | PRN
  Administered 2015-12-02 (×2): 25 mg via INTRAVENOUS

## 2015-12-02 NOTE — Discharge Instructions (Signed)
Resume usual medications and diet.  Imodium OTC 2 mg by mouth daily before breakfast and lunch.  Benefiber 4 g by mouth daily at bedtime.  No driving for 24 hours.  Physician will call with biopsy results.  Stool diary as to frequency and consistency of stools for the next 2 weeks.  Colonoscopy, Care After These instructions give you information on caring for yourself after your procedure. Your doctor may also give you more specific instructions. Call your doctor if you have any problems or questions after your procedure. HOME CARE  Do not drive for 24 hours.  Do not sign important papers or use machinery for 24 hours.  You may shower.  You may go back to your usual activities, but go slower for the first 24 hours.  Take rest breaks often during the first 24 hours.  Walk around or use warm packs on your belly (abdomen) if you have belly cramping or gas.  Drink enough fluids to keep your pee (urine) clear or pale yellow.  Resume your normal diet. Avoid heavy or fried foods.  Avoid drinking alcohol for 24 hours or as told by your doctor.  Only take medicines as told by your doctor. If a tissue sample (biopsy) was taken during the procedure:   Do not take aspirin or blood thinners for 7 days, or as told by your doctor.  Do not drink alcohol for 7 days, or as told by your doctor.  Eat soft foods for the first 24 hours. GET HELP IF: You still have a small amount of blood in your poop (stool) 2-3 days after the procedure. GET HELP RIGHT AWAY IF:  You have more than a small amount of blood in your poop.  You see clumps of tissue (blood clots) in your poop.  Your belly is puffy (swollen).  You feel sick to your stomach (nauseous) or throw up (vomit).  You have a fever.  You have belly pain that gets worse and medicine does not help. MAKE SURE YOU:  Understand these instructions.  Will watch your condition.  Will get help right away if you are not doing well or get  worse.   This information is not intended to replace advice given to you by your health care provider. Make sure you discuss any questions you have with your health care provider.   Document Released: 11/04/2010 Document Revised: 10/07/2013 Document Reviewed: 06/09/2013 Elsevier Interactive Patient Education 2016 [employer].   Hemorrhoids Hemorrhoids are swollen veins around the rectum or anus. There are two types of hemorrhoids:   Internal hemorrhoids. These occur in the veins just inside the rectum. They may poke through to the outside and become irritated and painful.  External hemorrhoids. These occur in the veins outside the anus and can be felt as a painful swelling or hard lump near the anus. CAUSES  Pregnancy.   Obesity.   Constipation or diarrhea.   Straining to have a bowel movement.   Sitting for long periods on the toilet.  Heavy lifting or other activity that caused you to strain.  Anal intercourse. SYMPTOMS   Pain.   Anal itching or irritation.   Rectal bleeding.   Fecal leakage.   Anal swelling.   One or more lumps around the anus.  DIAGNOSIS  Your caregiver may be able to diagnose hemorrhoids by visual examination. Other examinations or tests that may be performed include:   Examination of the rectal area with a gloved hand (digital rectal exam).  Examination of anal canal using a small tube (scope).   A blood test if you have lost a significant amount of blood.  A test to look inside the colon (sigmoidoscopy or colonoscopy). TREATMENT Most hemorrhoids can be treated at home. However, if symptoms do not seem to be getting better or if you have a lot of rectal bleeding, your caregiver may perform a procedure to help make the hemorrhoids get smaller or remove them completely. Possible treatments include:   Placing a rubber band at the base of the hemorrhoid to cut off the circulation (rubber band ligation).   Injecting a chemical  to shrink the hemorrhoid (sclerotherapy).   Using a tool to burn the hemorrhoid (infrared light therapy).   Surgically removing the hemorrhoid (hemorrhoidectomy).   Stapling the hemorrhoid to block blood flow to the tissue (hemorrhoid stapling).  HOME CARE INSTRUCTIONS   Eat foods with fiber, such as whole grains, beans, nuts, fruits, and vegetables. Ask your doctor about taking products with added fiber in them (fibersupplements).  Increase fluid intake. Drink enough water and fluids to keep your urine clear or pale yellow.   Exercise regularly.   Go to the bathroom when you have the urge to have a bowel movement. Do not wait.   Avoid straining to have bowel movements.   Keep the anal area dry and clean. Use wet toilet paper or moist towelettes after a bowel movement.   Medicated creams and suppositories may be used or applied as directed.   Only take over-the-counter or prescription medicines as directed by your caregiver.   Take warm sitz baths for 15-20 minutes, 3-4 times a day to ease pain and discomfort.   Place ice packs on the hemorrhoids if they are tender and swollen. Using ice packs between sitz baths may be helpful.   Put ice in a plastic bag.   Place a towel between your skin and the bag.   Leave the ice on for 15-20 minutes, 3-4 times a day.   Do not use a donut-shaped pillow or sit on the toilet for long periods. This increases blood pooling and pain.  SEEK MEDICAL CARE IF:  You have increasing pain and swelling that is not controlled by treatment or medicine.  You have uncontrolled bleeding.  You have difficulty or you are unable to have a bowel movement.  You have pain or inflammation outside the area of the hemorrhoids. MAKE SURE YOU:  Understand these instructions.  Will watch your condition.  Will get help right away if you are not doing well or get worse.   This information is not intended to replace advice given to you by  your health care provider. Make sure you discuss any questions you have with your health care provider.   Document Released: 09/29/2000 Document Revised: 09/18/2012 Document Reviewed: 08/06/2012 Elsevier Interactive Patient Education [employer].

## 2015-12-02 NOTE — Op Note (Signed)
COLONOSCOPY PROCEDURE REPORT  PATIENT:  [name]  MR#:  [id number] Birthdate:  07-01-1951, 65 y.o., female Endoscopist:  Dr. Rogene Houston, MD Referred By:  Dr. Alonza Bogus, MD Procedure Date: 12/02/2015  Procedure:   Colonoscopy  Indications:  Patient is 65 year old Caucasian female with 6 month history of nonbloody diarrhea. She is undergoing diagnostic colonoscopy. Her last exam was about 9 years ago.  Informed Consent:  The procedure and risks were reviewed with the patient and informed consent was obtained.  Medications:  Demerol 100 mg IV Versed 15 mg IV Promethazine 25 mg IV and diluted form.  First dose administered at 1319 Last dose administered at 68  Description of procedure:  After a digital rectal exam was performed, that colonoscope was advanced from the anus through the rectum and colon to the area of the cecum, ileocecal valve and appendiceal orifice. The cecum was deeply intubated. These structures were well-seen and photographed for the record. From the level of the cecum and ileocecal valve, the scope was slowly and cautiously withdrawn. The mucosal surfaces were carefully surveyed utilizing scope tip to flexion to facilitate fold flattening as needed. The scope was pulled down into the rectum where a thorough exam including retroflexion was performed.  Findings:   Prep satisfactory.  Cecum and some other areas had to be washed free of stool to complete the examination.  5 mm polyp cold snared from splenic flexure.  Random biopsies taken from normal-appearing mucosa of sigmoid colon.  normal rectal mucosa.  Small hemorrhoids noted above the dentate line.    Therapeutic/Diagnostic Maneuvers Performed:  See above  Complications:  none  EBL:  Minimal  Cecal Withdrawal Time:   17 minutes  Impression:   Examination performed to cecum.  No evidence of endoscopic colitis.  Random biopsies taken from mucosa of sigmoid colon looking for microscopic  colitis.  5 mm polyp cold snare from splenic flexure.  Small internal hemorrhoids.  Recommendations:  Standard instructions given.  Imodium OTC 2 mg by mouth before breakfast and lunch daily.  Benefiber 4 g by mouth daily at bedtime. I will contact patient with biopsy results and further recommendations.  Kursten Kruk U  12/02/2015 2:07 PM  CC: Dr. Alonza Bogus, MD & Dr. Rayne Du ref. provider found

## 2015-12-02 NOTE — H&P (Addendum)
[name] is an 65 y.o. female.   Chief Complaint:  Patient is here for colonoscopy. HPI:  Patient is 65 year old Caucasian female who presents with 6 month history of nonbloody diarrhea. On her worst days she has had more than 10 bowel movements per day. She has intermittent nocturnal bowel movement. She has had accidents. She denies abdominal pain melena or rectal bleeding. She has not lost any weight since diarrhea began. Stool studies by Dr. Luan Pulling were negative. She has not lost any weight since she has had diarrhea.  last colonoscopy was in 2008. She had a polyp removed but was not an adenoma. Random biopsies from sigmoid colon were negative for colitis.  Patient is status post gastric bypass surgery for obesity in 1985.  Past Medical History  Diagnosis Date  . Depression   . Arthritis   . Hypertension   . Anxiety     Past Surgical History  Procedure Laterality Date  . Eye surgery      as a child  . Cholecystectomy    . Gastric bypass      in 1985 for obesity  . Right foot surgery    . Colonoscopy      History reviewed. No pertinent family history. Social History:  reports that she has never smoked. She does not have any smokeless tobacco history on file. She reports that she does not drink alcohol or use illicit drugs.  Allergies: No Known Allergies  Medications Prior to Admission  Medication Sig Dispense Refill  . ALPRAZolam (XANAX) 1 MG tablet Take 1 mg by mouth 4 (four) times daily as needed for anxiety.    . Aspirin-Acetaminophen-Caffeine (GOODY HEADACHE PO) Take 1 packet by mouth daily as needed (pain).    Marland Kitchen oxyCODONE-acetaminophen (PERCOCET) 10-325 MG tablet Take 1 tablet by mouth every 6 (six) hours as needed for pain.    . trazodone (DESYREL) 300 MG tablet Take 300 mg by mouth at bedtime. Reported on 11/01/2015    . dicyclomine (BENTYL) 20 MG tablet Take 1 tablet (20 mg total) by mouth 2 (two) times daily before a meal. (Patient not taking: Reported on 11/22/2015)  60 tablet 4    No results found for this or any previous visit (from the past 48 hour(s)). No results found.  ROS  Blood pressure 163/95, pulse 90, temperature 98.3 F (36.8 C), temperature source Oral, resp. rate 19, height 5\' 7"  (1.702 m), weight 240 lb (108.863 kg), SpO2 100 %. Physical Exam  Constitutional: She appears well-developed and well-nourished.  HENT:  Mouth/Throat: Oropharynx is clear and moist.  Eyes: Conjunctivae are normal.  Neck: No thyromegaly present.  Cardiovascular: Normal rate, regular rhythm and normal heart sounds.   No murmur heard. Respiratory: Effort normal and breath sounds normal.  GI: Soft. She exhibits no distension and no mass. There is no tenderness.  Musculoskeletal: She exhibits no edema.  Lymphadenopathy:    She has no cervical adenopathy.  Neurological: She is alert.  Skin: Skin is warm and dry.     Assessment/Plan  Chronic diarrhea.  Diagnostic colonoscopy.   Rogene Houston, MD 12/02/2015, 1:14 PM

## 2015-12-06 ENCOUNTER — Ambulatory Visit: Payer: PPO | Admitting: Obstetrics and Gynecology

## 2015-12-06 ENCOUNTER — Other Ambulatory Visit: Payer: PPO

## 2015-12-07 ENCOUNTER — Encounter (HOSPITAL_COMMUNITY): Payer: Self-pay | Admitting: Internal Medicine

## 2015-12-13 ENCOUNTER — Encounter (INDEPENDENT_AMBULATORY_CARE_PROVIDER_SITE_OTHER): Payer: Self-pay | Admitting: Internal Medicine

## 2015-12-16 ENCOUNTER — Other Ambulatory Visit: Payer: Self-pay | Admitting: Obstetrics and Gynecology

## 2015-12-16 DIAGNOSIS — R102 Pelvic and perineal pain: Secondary | ICD-10-CM

## 2015-12-17 DIAGNOSIS — M545 Low back pain: Secondary | ICD-10-CM | POA: Diagnosis not present

## 2015-12-17 DIAGNOSIS — M6281 Muscle weakness (generalized): Secondary | ICD-10-CM | POA: Diagnosis not present

## 2015-12-17 DIAGNOSIS — M47816 Spondylosis without myelopathy or radiculopathy, lumbar region: Secondary | ICD-10-CM | POA: Diagnosis not present

## 2015-12-17 DIAGNOSIS — M4806 Spinal stenosis, lumbar region: Secondary | ICD-10-CM | POA: Diagnosis not present

## 2015-12-22 ENCOUNTER — Encounter: Payer: Self-pay | Admitting: Obstetrics and Gynecology

## 2015-12-22 ENCOUNTER — Other Ambulatory Visit: Payer: Self-pay | Admitting: Obstetrics and Gynecology

## 2015-12-22 ENCOUNTER — Ambulatory Visit: Payer: PPO

## 2015-12-22 ENCOUNTER — Other Ambulatory Visit (INDEPENDENT_AMBULATORY_CARE_PROVIDER_SITE_OTHER): Payer: PPO

## 2015-12-22 ENCOUNTER — Ambulatory Visit: Payer: PPO | Admitting: Obstetrics and Gynecology

## 2015-12-22 ENCOUNTER — Ambulatory Visit (INDEPENDENT_AMBULATORY_CARE_PROVIDER_SITE_OTHER): Payer: PPO | Admitting: Obstetrics and Gynecology

## 2015-12-22 VITALS — BP 130/82 | Ht 67.0 in

## 2015-12-22 DIAGNOSIS — N898 Other specified noninflammatory disorders of vagina: Secondary | ICD-10-CM | POA: Diagnosis not present

## 2015-12-22 DIAGNOSIS — R102 Pelvic and perineal pain: Secondary | ICD-10-CM

## 2015-12-22 NOTE — Progress Notes (Signed)
Pelvic/TV Ultrasound today.  Single anterior fibroid  (0.6 x 0.4 x 0.6 cm) noted in otherwise unremarkable uterus.  Endometrium measures 0.9 mm.  Bilateral ovaries appear normal.

## 2015-12-22 NOTE — Progress Notes (Signed)
[facility] Visit  Patient name: [name] MRN [id number]  Date of birth: 19-Oct-1950  CC & HPI:  [name] is a 65 y.o. female presenting today for follow-up of abnormal vaginal discharge with dark gray coloring that began 1 year ago. She notes associated intermittent vaginal pain that is "knife"-like in quality, lasting for 15 seconds at a time (she reported it lasted 5 minutes at a time during her last visit, but clarifies that she "didn't have my time tables right" at that time). She reports no known triggers of her pain. She states she wakes up every night because of her pain. Patient reports a history of 1 vaginal delivery. She states she has had an EGD and a colonoscopy by Dr Karilyn Cota , removing a colon polyp.   ROS:    Pertinent History Reviewed:   Reviewed: Significant for n/a (no history of gynecological surgeries) Medical         Past Medical History  Diagnosis Date  . Depression   . Arthritis   . Hypertension   . Anxiety                               Surgical Hx:    Past Surgical History  Procedure Laterality Date  . Eye surgery      as a child  . Cholecystectomy    . Gastric bypass      in 1985 for obesity  . Right foot surgery    . Colonoscopy    . Colonoscopy N/A 12/02/2015    Procedure: COLONOSCOPY;  Surgeon: Malissa Hippo, MD;  Location: [facility];  Service: Endoscopy;  Laterality: N/A;  110  . Biopsy N/A 12/02/2015    Procedure: BIOPSY;  Surgeon: Malissa Hippo, MD;  Location: [facility];  Service: Endoscopy;  Laterality: N/A;  Random colon biopsies  . Polypectomy N/A 12/02/2015    Procedure: POLYPECTOMY;  Surgeon: Malissa Hippo, MD;  Location: [facility];  Service: Endoscopy;  Laterality: N/A;  Splenic flexure polyp   Medications: Reviewed & Updated - see associated section                       Current outpatient prescriptions:  .  ALPRAZolam (XANAX) 1 MG tablet, Take 1 mg by mouth 4 (four) times daily as needed for anxiety.,  Disp: , Rfl:  .  Aspirin-Acetaminophen-Caffeine (GOODY HEADACHE PO), Take 1 packet by mouth daily as needed (pain)., Disp: , Rfl:  .  loperamide (IMODIUM) 2 MG capsule, Take 1 capsule (2 mg total) by mouth 2 (two) times daily with breakfast and lunch., Disp: 1 capsule, Rfl: 0 .  oxyCODONE-acetaminophen (PERCOCET) 10-325 MG tablet, Take 1 tablet by mouth every 6 (six) hours as needed for pain., Disp: , Rfl:  .  trazodone (DESYREL) 300 MG tablet, Take 300 mg by mouth at bedtime. Reported on 11/01/2015, Disp: , Rfl:  .  Wheat Dextrin (BENEFIBER DRINK MIX) PACK, Take 4 g by mouth at bedtime., Disp: , Rfl:    Social History: Reviewed -  reports that she has never smoked. She does not have any smokeless tobacco history on file.  Objective Findings:  Vitals: Blood pressure 130/82, height 5\' 7"  (1.702 m).  Physical Examination:  GENERAL: Well-developed, well-nourished female in no acute distress.  Pelvic exam: normal external genitalia, vulva, vagina, cervix, uterus and adnexa, VULVA: normal appearing vulva with  no masses, tenderness or lesions,  VAGINA: normal appearing vagina with normal color and discharge, no lesions, good support, small cystocele, Grade I CERVIX: normal appearing cervix without discharge or lesions,  UTERUS: uterus is tiny in size, shape, consistency and nontender,  ADNEXA: normal adnexa in size, nontender and no masses,    Assessment & Plan:   A:  1. No identifiable gynecological source for sharp pelvic pains. 2. Suspect GI origin. 3. Weight loss strongly encouraged.   P:  1. F/u prn.    By signing my name below, I, Ronney Lion, attest that this documentation has been prepared under the direction and in the presence of Tilda Burrow, MD. Electronically Signed: Ronney Lion, ED Scribe. 12/22/2015. 3:57 PM.  I personally performed the services described in this documentation, which was SCRIBED in my presence. The recorded information has been reviewed and considered  accurate. It has been edited as necessary during review. Tilda Burrow, MD

## 2015-12-28 DIAGNOSIS — M25511 Pain in right shoulder: Secondary | ICD-10-CM | POA: Diagnosis not present

## 2015-12-28 DIAGNOSIS — M47817 Spondylosis without myelopathy or radiculopathy, lumbosacral region: Secondary | ICD-10-CM | POA: Diagnosis not present

## 2015-12-28 DIAGNOSIS — M5136 Other intervertebral disc degeneration, lumbar region: Secondary | ICD-10-CM | POA: Diagnosis not present

## 2015-12-28 DIAGNOSIS — M545 Low back pain: Secondary | ICD-10-CM | POA: Diagnosis not present

## 2015-12-30 DIAGNOSIS — F410 Panic disorder [episodic paroxysmal anxiety] without agoraphobia: Secondary | ICD-10-CM | POA: Diagnosis not present

## 2015-12-30 DIAGNOSIS — Z9884 Bariatric surgery status: Secondary | ICD-10-CM | POA: Diagnosis not present

## 2015-12-30 DIAGNOSIS — G8929 Other chronic pain: Secondary | ICD-10-CM | POA: Diagnosis not present

## 2015-12-30 DIAGNOSIS — Z8249 Family history of ischemic heart disease and other diseases of the circulatory system: Secondary | ICD-10-CM | POA: Diagnosis not present

## 2015-12-30 DIAGNOSIS — F419 Anxiety disorder, unspecified: Secondary | ICD-10-CM | POA: Diagnosis not present

## 2015-12-30 DIAGNOSIS — Z79891 Long term (current) use of opiate analgesic: Secondary | ICD-10-CM | POA: Diagnosis not present

## 2015-12-30 DIAGNOSIS — I10 Essential (primary) hypertension: Secondary | ICD-10-CM | POA: Diagnosis not present

## 2015-12-30 DIAGNOSIS — Z79899 Other long term (current) drug therapy: Secondary | ICD-10-CM | POA: Diagnosis not present

## 2015-12-30 DIAGNOSIS — M549 Dorsalgia, unspecified: Secondary | ICD-10-CM | POA: Diagnosis not present

## 2015-12-30 DIAGNOSIS — F329 Major depressive disorder, single episode, unspecified: Secondary | ICD-10-CM | POA: Diagnosis not present

## 2015-12-30 DIAGNOSIS — R42 Dizziness and giddiness: Secondary | ICD-10-CM | POA: Diagnosis not present

## 2016-01-18 ENCOUNTER — Ambulatory Visit (INDEPENDENT_AMBULATORY_CARE_PROVIDER_SITE_OTHER): Payer: PPO | Admitting: Internal Medicine

## 2016-01-24 DIAGNOSIS — F419 Anxiety disorder, unspecified: Secondary | ICD-10-CM | POA: Diagnosis not present

## 2016-01-24 DIAGNOSIS — F410 Panic disorder [episodic paroxysmal anxiety] without agoraphobia: Secondary | ICD-10-CM | POA: Diagnosis not present

## 2016-01-24 DIAGNOSIS — M545 Low back pain: Secondary | ICD-10-CM | POA: Diagnosis not present

## 2016-01-24 DIAGNOSIS — I10 Essential (primary) hypertension: Secondary | ICD-10-CM | POA: Diagnosis not present

## 2016-01-31 ENCOUNTER — Encounter (INDEPENDENT_AMBULATORY_CARE_PROVIDER_SITE_OTHER): Payer: Self-pay | Admitting: Internal Medicine

## 2016-01-31 ENCOUNTER — Ambulatory Visit (INDEPENDENT_AMBULATORY_CARE_PROVIDER_SITE_OTHER): Payer: PPO | Admitting: Internal Medicine

## 2016-01-31 VITALS — BP 142/80 | HR 56 | Temp 97.6°F | Ht 67.0 in | Wt 237.2 lb

## 2016-01-31 DIAGNOSIS — R197 Diarrhea, unspecified: Secondary | ICD-10-CM | POA: Diagnosis not present

## 2016-01-31 MED ORDER — DICYCLOMINE HCL 10 MG PO CAPS: 10.0000 mg | ORAL_CAPSULE | Freq: Three times a day (TID) | ORAL | Status: DC

## 2016-01-31 NOTE — Patient Instructions (Signed)
OV in 6 months. Rx for Dicyclomine 10mg  TID.

## 2016-01-31 NOTE — Progress Notes (Signed)
Subjective:    Patient ID: [name], female    DOB: [birth year], 65 y.o.   MRN: [id number]  HPIHere today for f/u. She recently underwent a colonoscopy for diarrhea.  Colonoscopy was negative for Microscopic colitis. She tells me she averages about 3 stools a day. She says the Percocet helps with her stools.  Her appetite is good. No weight loss. No melena or BRRB.     12/02/2015 Colonoscopy: I ndications: Patient is 65 year old Caucasian female with 6 month history of nonbloody diarrhea. She is undergoing diagnostic colonoscopy. Her last exam was about 9 years ago.  Impression:  Examination performed to cecum. No evidence of endoscopic colitis. Random biopsies taken from mucosa of sigmoid colon looking for microscopic colitis. 5 mm polyp cold snare from splenic flexure. Small internal hemorrhoids.  Colonic biopsy negative for colitis. She remains with diarrhea. Patient will continue Imodium and fiber supplement and return for office visit in 4-6 weeks.  Review of Systems Past Medical History  Diagnosis Date  . Depression   . Arthritis   . Hypertension   . Anxiety   . Panic attacks     Past Surgical History  Procedure Laterality Date  . Eye surgery      as a child  . Cholecystectomy    . Gastric bypass      in 1985 for obesity  . Right foot surgery    . Colonoscopy    . Colonoscopy N/A 12/02/2015    Procedure: COLONOSCOPY;  Surgeon: Malissa Hippo, MD;  Location: [facility];  Service: Endoscopy;  Laterality: N/A;  110  . Biopsy N/A 12/02/2015    Procedure: BIOPSY;  Surgeon: Malissa Hippo, MD;  Location: [facility];  Service: Endoscopy;  Laterality: N/A;  Random colon biopsies  . Polypectomy N/A 12/02/2015    Procedure: POLYPECTOMY;  Surgeon: Malissa Hippo, MD;  Location: [facility];  Service: Endoscopy;  Laterality: N/A;  Splenic flexure polyp    No Known Allergies  Current Outpatient Prescriptions on File Prior to Visit  Medication Sig  Dispense Refill  . ALPRAZolam (XANAX) 1 MG tablet Take 1 mg by mouth 4 (four) times daily as needed for anxiety.    . Aspirin-Acetaminophen-Caffeine (GOODY HEADACHE PO) Take 1 packet by mouth daily as needed (pain). Reported on 01/31/2016    . loperamide (IMODIUM) 2 MG capsule Take 1 capsule (2 mg total) by mouth 2 (two) times daily with breakfast and lunch. 1 capsule 0  . oxyCODONE-acetaminophen (PERCOCET) 10-325 MG tablet Take 1 tablet by mouth every 6 (six) hours as needed for pain.    . trazodone (DESYREL) 300 MG tablet Take 300 mg by mouth at bedtime. Reported on 11/01/2015    . Wheat Dextrin (BENEFIBER DRINK MIX) PACK Take 4 g by mouth at bedtime.     No current facility-administered medications on file prior to visit.        Objective:   Physical Exam Blood pressure 142/80, pulse 56, temperature 97.6 F (36.4 C), height 5\' 7"  (1.702 m), weight 237 lb 3.2 oz (107.593 kg).  Alert and oriented. Skin warm and dry. Oral mucosa is moist.   . Sclera anicteric, conjunctivae is pink. Thyroid not enlarged. No cervical lymphadenopathy. Lungs clear. Heart regular rate and rhythm.  Abdomen is soft. Bowel sounds are positive. No hepatomegaly. No abdominal masses felt. No tenderness.  No edema to lower extremities.         Assessment & Plan:  Diarrhea. Am going  to try Dicyclomine 10mg  TID and see how she does,. OV in 6 months.

## 2016-02-01 DIAGNOSIS — F419 Anxiety disorder, unspecified: Secondary | ICD-10-CM | POA: Diagnosis not present

## 2016-02-01 DIAGNOSIS — M47816 Spondylosis without myelopathy or radiculopathy, lumbar region: Secondary | ICD-10-CM | POA: Diagnosis not present

## 2016-02-01 DIAGNOSIS — Z809 Family history of malignant neoplasm, unspecified: Secondary | ICD-10-CM | POA: Diagnosis not present

## 2016-02-01 DIAGNOSIS — M47817 Spondylosis without myelopathy or radiculopathy, lumbosacral region: Secondary | ICD-10-CM | POA: Diagnosis not present

## 2016-02-01 DIAGNOSIS — G4700 Insomnia, unspecified: Secondary | ICD-10-CM | POA: Diagnosis not present

## 2016-02-01 DIAGNOSIS — M1990 Unspecified osteoarthritis, unspecified site: Secondary | ICD-10-CM | POA: Diagnosis not present

## 2016-02-01 DIAGNOSIS — M5136 Other intervertebral disc degeneration, lumbar region: Secondary | ICD-10-CM | POA: Diagnosis not present

## 2016-02-01 DIAGNOSIS — Z9884 Bariatric surgery status: Secondary | ICD-10-CM | POA: Diagnosis not present

## 2016-02-01 DIAGNOSIS — Z79899 Other long term (current) drug therapy: Secondary | ICD-10-CM | POA: Diagnosis not present

## 2016-02-17 DIAGNOSIS — M79676 Pain in unspecified toe(s): Secondary | ICD-10-CM | POA: Diagnosis not present

## 2016-02-17 DIAGNOSIS — B351 Tinea unguium: Secondary | ICD-10-CM | POA: Diagnosis not present

## 2016-03-09 DIAGNOSIS — M1990 Unspecified osteoarthritis, unspecified site: Secondary | ICD-10-CM | POA: Diagnosis not present

## 2016-03-09 DIAGNOSIS — F410 Panic disorder [episodic paroxysmal anxiety] without agoraphobia: Secondary | ICD-10-CM | POA: Diagnosis not present

## 2016-03-09 DIAGNOSIS — M545 Low back pain: Secondary | ICD-10-CM | POA: Diagnosis not present

## 2016-03-09 DIAGNOSIS — G4700 Insomnia, unspecified: Secondary | ICD-10-CM | POA: Diagnosis not present

## 2016-05-25 DIAGNOSIS — R4182 Altered mental status, unspecified: Secondary | ICD-10-CM | POA: Diagnosis not present

## 2016-05-25 DIAGNOSIS — I10 Essential (primary) hypertension: Secondary | ICD-10-CM | POA: Diagnosis not present

## 2016-05-25 DIAGNOSIS — K922 Gastrointestinal hemorrhage, unspecified: Secondary | ICD-10-CM | POA: Diagnosis not present

## 2016-05-25 DIAGNOSIS — R0602 Shortness of breath: Secondary | ICD-10-CM | POA: Diagnosis not present

## 2016-05-25 DIAGNOSIS — A419 Sepsis, unspecified organism: Secondary | ICD-10-CM | POA: Diagnosis not present

## 2016-05-25 DIAGNOSIS — R402421 Glasgow coma scale score 9-12, in the field [EMT or ambulance]: Secondary | ICD-10-CM | POA: Diagnosis not present

## 2016-05-25 DIAGNOSIS — I639 Cerebral infarction, unspecified: Secondary | ICD-10-CM | POA: Diagnosis not present

## 2016-05-26 DIAGNOSIS — R451 Restlessness and agitation: Secondary | ICD-10-CM | POA: Diagnosis not present

## 2016-05-26 DIAGNOSIS — R509 Fever, unspecified: Secondary | ICD-10-CM | POA: Diagnosis not present

## 2016-05-26 DIAGNOSIS — R161 Splenomegaly, not elsewhere classified: Secondary | ICD-10-CM | POA: Diagnosis not present

## 2016-05-26 DIAGNOSIS — M549 Dorsalgia, unspecified: Secondary | ICD-10-CM | POA: Diagnosis not present

## 2016-05-26 DIAGNOSIS — F418 Other specified anxiety disorders: Secondary | ICD-10-CM | POA: Diagnosis not present

## 2016-05-26 DIAGNOSIS — G8929 Other chronic pain: Secondary | ICD-10-CM | POA: Diagnosis not present

## 2016-05-26 DIAGNOSIS — A419 Sepsis, unspecified organism: Secondary | ICD-10-CM | POA: Diagnosis not present

## 2016-05-26 DIAGNOSIS — I63312 Cerebral infarction due to thrombosis of left middle cerebral artery: Secondary | ICD-10-CM | POA: Diagnosis not present

## 2016-05-26 DIAGNOSIS — R001 Bradycardia, unspecified: Secondary | ICD-10-CM | POA: Diagnosis not present

## 2016-05-26 DIAGNOSIS — B961 Klebsiella pneumoniae [K. pneumoniae] as the cause of diseases classified elsewhere: Secondary | ICD-10-CM | POA: Diagnosis not present

## 2016-05-26 DIAGNOSIS — K921 Melena: Secondary | ICD-10-CM | POA: Diagnosis not present

## 2016-05-26 DIAGNOSIS — F419 Anxiety disorder, unspecified: Secondary | ICD-10-CM | POA: Diagnosis not present

## 2016-05-26 DIAGNOSIS — A4189 Other specified sepsis: Secondary | ICD-10-CM | POA: Diagnosis not present

## 2016-05-26 DIAGNOSIS — F1010 Alcohol abuse, uncomplicated: Secondary | ICD-10-CM | POA: Diagnosis not present

## 2016-05-26 DIAGNOSIS — M6282 Rhabdomyolysis: Secondary | ICD-10-CM | POA: Diagnosis not present

## 2016-05-26 DIAGNOSIS — Z6835 Body mass index (BMI) 35.0-35.9, adult: Secondary | ICD-10-CM | POA: Diagnosis not present

## 2016-05-26 DIAGNOSIS — I071 Rheumatic tricuspid insufficiency: Secondary | ICD-10-CM | POA: Diagnosis not present

## 2016-05-26 DIAGNOSIS — G8191 Hemiplegia, unspecified affecting right dominant side: Secondary | ICD-10-CM | POA: Diagnosis not present

## 2016-05-26 DIAGNOSIS — E669 Obesity, unspecified: Secondary | ICD-10-CM | POA: Diagnosis not present

## 2016-05-26 DIAGNOSIS — R4182 Altered mental status, unspecified: Secondary | ICD-10-CM | POA: Diagnosis not present

## 2016-05-26 DIAGNOSIS — F1021 Alcohol dependence, in remission: Secondary | ICD-10-CM | POA: Diagnosis not present

## 2016-05-26 DIAGNOSIS — R4701 Aphasia: Secondary | ICD-10-CM | POA: Diagnosis not present

## 2016-05-26 DIAGNOSIS — G9349 Other encephalopathy: Secondary | ICD-10-CM | POA: Diagnosis not present

## 2016-05-26 DIAGNOSIS — E873 Alkalosis: Secondary | ICD-10-CM | POA: Diagnosis not present

## 2016-05-26 DIAGNOSIS — D62 Acute posthemorrhagic anemia: Secondary | ICD-10-CM | POA: Diagnosis not present

## 2016-05-26 DIAGNOSIS — K529 Noninfective gastroenteritis and colitis, unspecified: Secondary | ICD-10-CM | POA: Diagnosis not present

## 2016-05-26 DIAGNOSIS — I6350 Cerebral infarction due to unspecified occlusion or stenosis of unspecified cerebral artery: Secondary | ICD-10-CM | POA: Diagnosis not present

## 2016-05-26 DIAGNOSIS — I639 Cerebral infarction, unspecified: Secondary | ICD-10-CM | POA: Diagnosis not present

## 2016-05-26 DIAGNOSIS — R410 Disorientation, unspecified: Secondary | ICD-10-CM | POA: Diagnosis not present

## 2016-05-26 DIAGNOSIS — R6520 Severe sepsis without septic shock: Secondary | ICD-10-CM | POA: Diagnosis not present

## 2016-05-26 DIAGNOSIS — I6340 Cerebral infarction due to embolism of unspecified cerebral artery: Secondary | ICD-10-CM | POA: Diagnosis not present

## 2016-05-26 DIAGNOSIS — D649 Anemia, unspecified: Secondary | ICD-10-CM | POA: Diagnosis not present

## 2016-05-26 DIAGNOSIS — I10 Essential (primary) hypertension: Secondary | ICD-10-CM | POA: Diagnosis not present

## 2016-05-26 DIAGNOSIS — N390 Urinary tract infection, site not specified: Secondary | ICD-10-CM | POA: Diagnosis not present

## 2016-05-27 DIAGNOSIS — M6282 Rhabdomyolysis: Secondary | ICD-10-CM | POA: Diagnosis not present

## 2016-05-27 DIAGNOSIS — R509 Fever, unspecified: Secondary | ICD-10-CM | POA: Diagnosis not present

## 2016-05-27 DIAGNOSIS — R001 Bradycardia, unspecified: Secondary | ICD-10-CM | POA: Diagnosis not present

## 2016-05-27 DIAGNOSIS — I639 Cerebral infarction, unspecified: Secondary | ICD-10-CM | POA: Diagnosis not present

## 2016-05-27 DIAGNOSIS — I6350 Cerebral infarction due to unspecified occlusion or stenosis of unspecified cerebral artery: Secondary | ICD-10-CM | POA: Insufficient documentation

## 2016-05-27 DIAGNOSIS — G9389 Other specified disorders of brain: Secondary | ICD-10-CM | POA: Diagnosis not present

## 2016-05-27 DIAGNOSIS — R109 Unspecified abdominal pain: Secondary | ICD-10-CM | POA: Diagnosis not present

## 2016-05-27 DIAGNOSIS — S2231XA Fracture of one rib, right side, initial encounter for closed fracture: Secondary | ICD-10-CM | POA: Diagnosis not present

## 2016-05-27 DIAGNOSIS — K922 Gastrointestinal hemorrhage, unspecified: Secondary | ICD-10-CM | POA: Diagnosis not present

## 2016-05-28 DIAGNOSIS — R509 Fever, unspecified: Secondary | ICD-10-CM | POA: Diagnosis not present

## 2016-05-28 DIAGNOSIS — I639 Cerebral infarction, unspecified: Secondary | ICD-10-CM | POA: Diagnosis not present

## 2016-05-28 DIAGNOSIS — M6282 Rhabdomyolysis: Secondary | ICD-10-CM | POA: Diagnosis not present

## 2016-05-28 DIAGNOSIS — R0789 Other chest pain: Secondary | ICD-10-CM | POA: Diagnosis not present

## 2016-05-28 DIAGNOSIS — K922 Gastrointestinal hemorrhage, unspecified: Secondary | ICD-10-CM | POA: Diagnosis not present

## 2016-05-29 DIAGNOSIS — I63512 Cerebral infarction due to unspecified occlusion or stenosis of left middle cerebral artery: Secondary | ICD-10-CM | POA: Diagnosis not present

## 2016-05-29 DIAGNOSIS — K922 Gastrointestinal hemorrhage, unspecified: Secondary | ICD-10-CM | POA: Diagnosis not present

## 2016-05-29 DIAGNOSIS — R001 Bradycardia, unspecified: Secondary | ICD-10-CM | POA: Diagnosis not present

## 2016-05-29 DIAGNOSIS — R509 Fever, unspecified: Secondary | ICD-10-CM | POA: Diagnosis not present

## 2016-05-29 DIAGNOSIS — I639 Cerebral infarction, unspecified: Secondary | ICD-10-CM | POA: Diagnosis not present

## 2016-05-29 DIAGNOSIS — I480 Paroxysmal atrial fibrillation: Secondary | ICD-10-CM | POA: Diagnosis not present

## 2016-05-29 DIAGNOSIS — R4701 Aphasia: Secondary | ICD-10-CM | POA: Diagnosis not present

## 2016-05-29 DIAGNOSIS — F1010 Alcohol abuse, uncomplicated: Secondary | ICD-10-CM | POA: Diagnosis not present

## 2016-05-29 DIAGNOSIS — I638 Other cerebral infarction: Secondary | ICD-10-CM | POA: Diagnosis not present

## 2016-05-29 DIAGNOSIS — M6282 Rhabdomyolysis: Secondary | ICD-10-CM | POA: Diagnosis not present

## 2016-05-30 DIAGNOSIS — Z7409 Other reduced mobility: Secondary | ICD-10-CM | POA: Diagnosis not present

## 2016-05-30 DIAGNOSIS — R4189 Other symptoms and signs involving cognitive functions and awareness: Secondary | ICD-10-CM | POA: Diagnosis not present

## 2016-05-30 DIAGNOSIS — F1010 Alcohol abuse, uncomplicated: Secondary | ICD-10-CM | POA: Diagnosis not present

## 2016-05-30 DIAGNOSIS — R509 Fever, unspecified: Secondary | ICD-10-CM | POA: Diagnosis not present

## 2016-05-30 DIAGNOSIS — I6322 Cerebral infarction due to unspecified occlusion or stenosis of basilar arteries: Secondary | ICD-10-CM | POA: Diagnosis not present

## 2016-05-30 DIAGNOSIS — I638 Other cerebral infarction: Secondary | ICD-10-CM | POA: Diagnosis not present

## 2016-05-30 DIAGNOSIS — M6282 Rhabdomyolysis: Secondary | ICD-10-CM | POA: Diagnosis not present

## 2016-05-30 DIAGNOSIS — I639 Cerebral infarction, unspecified: Secondary | ICD-10-CM | POA: Diagnosis not present

## 2016-05-30 DIAGNOSIS — K922 Gastrointestinal hemorrhage, unspecified: Secondary | ICD-10-CM | POA: Diagnosis not present

## 2016-05-30 DIAGNOSIS — I63511 Cerebral infarction due to unspecified occlusion or stenosis of right middle cerebral artery: Secondary | ICD-10-CM | POA: Diagnosis not present

## 2016-05-31 DIAGNOSIS — K922 Gastrointestinal hemorrhage, unspecified: Secondary | ICD-10-CM | POA: Diagnosis not present

## 2016-05-31 DIAGNOSIS — I639 Cerebral infarction, unspecified: Secondary | ICD-10-CM | POA: Diagnosis not present

## 2016-05-31 DIAGNOSIS — T39395A Adverse effect of other nonsteroidal anti-inflammatory drugs [NSAID], initial encounter: Secondary | ICD-10-CM | POA: Diagnosis not present

## 2016-05-31 DIAGNOSIS — K921 Melena: Secondary | ICD-10-CM | POA: Diagnosis not present

## 2016-05-31 DIAGNOSIS — R509 Fever, unspecified: Secondary | ICD-10-CM | POA: Diagnosis not present

## 2016-06-01 DIAGNOSIS — I639 Cerebral infarction, unspecified: Secondary | ICD-10-CM | POA: Diagnosis not present

## 2016-06-01 DIAGNOSIS — R509 Fever, unspecified: Secondary | ICD-10-CM | POA: Diagnosis not present

## 2016-06-01 DIAGNOSIS — K922 Gastrointestinal hemorrhage, unspecified: Secondary | ICD-10-CM | POA: Diagnosis not present

## 2016-06-02 DIAGNOSIS — K922 Gastrointestinal hemorrhage, unspecified: Secondary | ICD-10-CM | POA: Diagnosis not present

## 2016-06-02 DIAGNOSIS — R509 Fever, unspecified: Secondary | ICD-10-CM | POA: Diagnosis not present

## 2016-06-02 DIAGNOSIS — I63512 Cerebral infarction due to unspecified occlusion or stenosis of left middle cerebral artery: Secondary | ICD-10-CM | POA: Diagnosis not present

## 2016-06-02 DIAGNOSIS — I639 Cerebral infarction, unspecified: Secondary | ICD-10-CM | POA: Diagnosis not present

## 2016-06-03 DIAGNOSIS — I639 Cerebral infarction, unspecified: Secondary | ICD-10-CM | POA: Diagnosis not present

## 2016-06-03 DIAGNOSIS — R509 Fever, unspecified: Secondary | ICD-10-CM | POA: Diagnosis not present

## 2016-06-03 DIAGNOSIS — K922 Gastrointestinal hemorrhage, unspecified: Secondary | ICD-10-CM | POA: Diagnosis not present

## 2016-06-04 DIAGNOSIS — I639 Cerebral infarction, unspecified: Secondary | ICD-10-CM | POA: Diagnosis not present

## 2016-06-05 DIAGNOSIS — Z7409 Other reduced mobility: Secondary | ICD-10-CM | POA: Diagnosis not present

## 2016-06-05 DIAGNOSIS — I959 Hypotension, unspecified: Secondary | ICD-10-CM | POA: Diagnosis not present

## 2016-06-05 DIAGNOSIS — I69391 Dysphagia following cerebral infarction: Secondary | ICD-10-CM | POA: Diagnosis not present

## 2016-06-05 DIAGNOSIS — I69351 Hemiplegia and hemiparesis following cerebral infarction affecting right dominant side: Secondary | ICD-10-CM | POA: Diagnosis not present

## 2016-06-05 DIAGNOSIS — F319 Bipolar disorder, unspecified: Secondary | ICD-10-CM | POA: Diagnosis not present

## 2016-06-05 DIAGNOSIS — I639 Cerebral infarction, unspecified: Secondary | ICD-10-CM | POA: Diagnosis not present

## 2016-06-05 DIAGNOSIS — G9340 Encephalopathy, unspecified: Secondary | ICD-10-CM | POA: Diagnosis not present

## 2016-06-05 DIAGNOSIS — I69318 Other symptoms and signs involving cognitive functions following cerebral infarction: Secondary | ICD-10-CM | POA: Diagnosis not present

## 2016-06-05 DIAGNOSIS — I69320 Aphasia following cerebral infarction: Secondary | ICD-10-CM | POA: Diagnosis not present

## 2016-06-05 DIAGNOSIS — I63532 Cerebral infarction due to unspecified occlusion or stenosis of left posterior cerebral artery: Secondary | ICD-10-CM | POA: Diagnosis not present

## 2016-06-05 DIAGNOSIS — K922 Gastrointestinal hemorrhage, unspecified: Secondary | ICD-10-CM | POA: Diagnosis not present

## 2016-06-05 DIAGNOSIS — I69311 Memory deficit following cerebral infarction: Secondary | ICD-10-CM | POA: Diagnosis not present

## 2016-06-05 DIAGNOSIS — E876 Hypokalemia: Secondary | ICD-10-CM | POA: Diagnosis not present

## 2016-06-05 DIAGNOSIS — R6889 Other general symptoms and signs: Secondary | ICD-10-CM | POA: Diagnosis not present

## 2016-06-05 DIAGNOSIS — I10 Essential (primary) hypertension: Secondary | ICD-10-CM | POA: Diagnosis not present

## 2016-06-05 DIAGNOSIS — M549 Dorsalgia, unspecified: Secondary | ICD-10-CM | POA: Diagnosis not present

## 2016-06-05 DIAGNOSIS — R4189 Other symptoms and signs involving cognitive functions and awareness: Secondary | ICD-10-CM | POA: Insufficient documentation

## 2016-06-05 DIAGNOSIS — F1010 Alcohol abuse, uncomplicated: Secondary | ICD-10-CM | POA: Diagnosis not present

## 2016-06-05 DIAGNOSIS — D62 Acute posthemorrhagic anemia: Secondary | ICD-10-CM | POA: Diagnosis not present

## 2016-06-05 DIAGNOSIS — R1310 Dysphagia, unspecified: Secondary | ICD-10-CM | POA: Diagnosis not present

## 2016-06-05 DIAGNOSIS — M6282 Rhabdomyolysis: Secondary | ICD-10-CM | POA: Diagnosis not present

## 2016-06-05 DIAGNOSIS — Z8673 Personal history of transient ischemic attack (TIA), and cerebral infarction without residual deficits: Secondary | ICD-10-CM | POA: Insufficient documentation

## 2016-06-05 DIAGNOSIS — G8929 Other chronic pain: Secondary | ICD-10-CM | POA: Diagnosis not present

## 2016-06-05 DIAGNOSIS — F418 Other specified anxiety disorders: Secondary | ICD-10-CM | POA: Diagnosis not present

## 2016-06-05 DIAGNOSIS — F329 Major depressive disorder, single episode, unspecified: Secondary | ICD-10-CM | POA: Diagnosis not present

## 2016-06-05 DIAGNOSIS — M545 Low back pain: Secondary | ICD-10-CM | POA: Diagnosis not present

## 2016-06-05 DIAGNOSIS — R41841 Cognitive communication deficit: Secondary | ICD-10-CM | POA: Diagnosis not present

## 2016-06-05 DIAGNOSIS — D510 Vitamin B12 deficiency anemia due to intrinsic factor deficiency: Secondary | ICD-10-CM | POA: Diagnosis not present

## 2016-06-05 DIAGNOSIS — G4700 Insomnia, unspecified: Secondary | ICD-10-CM | POA: Diagnosis not present

## 2016-06-05 DIAGNOSIS — Z789 Other specified health status: Secondary | ICD-10-CM | POA: Insufficient documentation

## 2016-06-05 DIAGNOSIS — F419 Anxiety disorder, unspecified: Secondary | ICD-10-CM | POA: Diagnosis not present

## 2016-06-06 DIAGNOSIS — D500 Iron deficiency anemia secondary to blood loss (chronic): Secondary | ICD-10-CM | POA: Diagnosis not present

## 2016-06-06 DIAGNOSIS — R110 Nausea: Secondary | ICD-10-CM | POA: Diagnosis not present

## 2016-06-06 DIAGNOSIS — I69291 Dysphagia following other nontraumatic intracranial hemorrhage: Secondary | ICD-10-CM | POA: Diagnosis not present

## 2016-06-06 DIAGNOSIS — F1010 Alcohol abuse, uncomplicated: Secondary | ICD-10-CM | POA: Diagnosis not present

## 2016-06-06 DIAGNOSIS — M549 Dorsalgia, unspecified: Secondary | ICD-10-CM | POA: Diagnosis not present

## 2016-06-06 DIAGNOSIS — F418 Other specified anxiety disorders: Secondary | ICD-10-CM | POA: Diagnosis not present

## 2016-06-06 DIAGNOSIS — I69318 Other symptoms and signs involving cognitive functions following cerebral infarction: Secondary | ICD-10-CM | POA: Diagnosis not present

## 2016-06-06 DIAGNOSIS — G8929 Other chronic pain: Secondary | ICD-10-CM | POA: Diagnosis not present

## 2016-06-06 DIAGNOSIS — R1310 Dysphagia, unspecified: Secondary | ICD-10-CM | POA: Diagnosis not present

## 2016-06-06 DIAGNOSIS — G4700 Insomnia, unspecified: Secondary | ICD-10-CM | POA: Diagnosis not present

## 2016-06-06 DIAGNOSIS — Z7409 Other reduced mobility: Secondary | ICD-10-CM | POA: Diagnosis not present

## 2016-06-06 DIAGNOSIS — I10 Essential (primary) hypertension: Secondary | ICD-10-CM | POA: Diagnosis not present

## 2016-06-07 DIAGNOSIS — D500 Iron deficiency anemia secondary to blood loss (chronic): Secondary | ICD-10-CM | POA: Diagnosis not present

## 2016-06-07 DIAGNOSIS — R110 Nausea: Secondary | ICD-10-CM | POA: Diagnosis not present

## 2016-06-07 DIAGNOSIS — G8929 Other chronic pain: Secondary | ICD-10-CM | POA: Diagnosis not present

## 2016-06-07 DIAGNOSIS — Z7409 Other reduced mobility: Secondary | ICD-10-CM | POA: Diagnosis not present

## 2016-06-07 DIAGNOSIS — I10 Essential (primary) hypertension: Secondary | ICD-10-CM | POA: Diagnosis not present

## 2016-06-07 DIAGNOSIS — F1010 Alcohol abuse, uncomplicated: Secondary | ICD-10-CM | POA: Diagnosis not present

## 2016-06-07 DIAGNOSIS — G4700 Insomnia, unspecified: Secondary | ICD-10-CM | POA: Diagnosis not present

## 2016-06-07 DIAGNOSIS — M549 Dorsalgia, unspecified: Secondary | ICD-10-CM | POA: Diagnosis not present

## 2016-06-07 DIAGNOSIS — F418 Other specified anxiety disorders: Secondary | ICD-10-CM | POA: Diagnosis not present

## 2016-06-07 DIAGNOSIS — I69291 Dysphagia following other nontraumatic intracranial hemorrhage: Secondary | ICD-10-CM | POA: Diagnosis not present

## 2016-06-07 DIAGNOSIS — I69318 Other symptoms and signs involving cognitive functions following cerebral infarction: Secondary | ICD-10-CM | POA: Diagnosis not present

## 2016-06-07 DIAGNOSIS — R1310 Dysphagia, unspecified: Secondary | ICD-10-CM | POA: Diagnosis not present

## 2016-06-08 DIAGNOSIS — Z7409 Other reduced mobility: Secondary | ICD-10-CM | POA: Diagnosis not present

## 2016-06-08 DIAGNOSIS — F1010 Alcohol abuse, uncomplicated: Secondary | ICD-10-CM | POA: Diagnosis not present

## 2016-06-08 DIAGNOSIS — M549 Dorsalgia, unspecified: Secondary | ICD-10-CM | POA: Diagnosis not present

## 2016-06-08 DIAGNOSIS — I69291 Dysphagia following other nontraumatic intracranial hemorrhage: Secondary | ICD-10-CM | POA: Diagnosis not present

## 2016-06-08 DIAGNOSIS — G4700 Insomnia, unspecified: Secondary | ICD-10-CM | POA: Diagnosis not present

## 2016-06-08 DIAGNOSIS — G8929 Other chronic pain: Secondary | ICD-10-CM | POA: Diagnosis not present

## 2016-06-08 DIAGNOSIS — R1310 Dysphagia, unspecified: Secondary | ICD-10-CM | POA: Diagnosis not present

## 2016-06-08 DIAGNOSIS — I10 Essential (primary) hypertension: Secondary | ICD-10-CM | POA: Diagnosis not present

## 2016-06-08 DIAGNOSIS — I69318 Other symptoms and signs involving cognitive functions following cerebral infarction: Secondary | ICD-10-CM | POA: Diagnosis not present

## 2016-06-08 DIAGNOSIS — D500 Iron deficiency anemia secondary to blood loss (chronic): Secondary | ICD-10-CM | POA: Diagnosis not present

## 2016-06-08 DIAGNOSIS — F418 Other specified anxiety disorders: Secondary | ICD-10-CM | POA: Diagnosis not present

## 2016-06-08 DIAGNOSIS — R110 Nausea: Secondary | ICD-10-CM | POA: Diagnosis not present

## 2016-06-09 DIAGNOSIS — G4700 Insomnia, unspecified: Secondary | ICD-10-CM | POA: Diagnosis not present

## 2016-06-09 DIAGNOSIS — M549 Dorsalgia, unspecified: Secondary | ICD-10-CM | POA: Diagnosis not present

## 2016-06-09 DIAGNOSIS — I10 Essential (primary) hypertension: Secondary | ICD-10-CM | POA: Diagnosis not present

## 2016-06-09 DIAGNOSIS — I69318 Other symptoms and signs involving cognitive functions following cerebral infarction: Secondary | ICD-10-CM | POA: Diagnosis not present

## 2016-06-09 DIAGNOSIS — F1010 Alcohol abuse, uncomplicated: Secondary | ICD-10-CM | POA: Diagnosis not present

## 2016-06-09 DIAGNOSIS — I69291 Dysphagia following other nontraumatic intracranial hemorrhage: Secondary | ICD-10-CM | POA: Diagnosis not present

## 2016-06-09 DIAGNOSIS — F418 Other specified anxiety disorders: Secondary | ICD-10-CM | POA: Diagnosis not present

## 2016-06-09 DIAGNOSIS — Z7409 Other reduced mobility: Secondary | ICD-10-CM | POA: Diagnosis not present

## 2016-06-09 DIAGNOSIS — G8929 Other chronic pain: Secondary | ICD-10-CM | POA: Diagnosis not present

## 2016-06-09 DIAGNOSIS — D500 Iron deficiency anemia secondary to blood loss (chronic): Secondary | ICD-10-CM | POA: Diagnosis not present

## 2016-06-09 DIAGNOSIS — R1310 Dysphagia, unspecified: Secondary | ICD-10-CM | POA: Diagnosis not present

## 2016-06-09 DIAGNOSIS — R110 Nausea: Secondary | ICD-10-CM | POA: Diagnosis not present

## 2016-06-10 DIAGNOSIS — I10 Essential (primary) hypertension: Secondary | ICD-10-CM | POA: Diagnosis not present

## 2016-06-10 DIAGNOSIS — F329 Major depressive disorder, single episode, unspecified: Secondary | ICD-10-CM | POA: Diagnosis not present

## 2016-06-10 DIAGNOSIS — G4700 Insomnia, unspecified: Secondary | ICD-10-CM | POA: Diagnosis not present

## 2016-06-10 DIAGNOSIS — I639 Cerebral infarction, unspecified: Secondary | ICD-10-CM | POA: Diagnosis not present

## 2016-06-10 DIAGNOSIS — R1310 Dysphagia, unspecified: Secondary | ICD-10-CM | POA: Diagnosis not present

## 2016-06-10 DIAGNOSIS — Z7409 Other reduced mobility: Secondary | ICD-10-CM | POA: Diagnosis not present

## 2016-06-10 DIAGNOSIS — D649 Anemia, unspecified: Secondary | ICD-10-CM | POA: Diagnosis not present

## 2016-06-10 DIAGNOSIS — R110 Nausea: Secondary | ICD-10-CM | POA: Diagnosis not present

## 2016-06-10 DIAGNOSIS — Z7982 Long term (current) use of aspirin: Secondary | ICD-10-CM | POA: Diagnosis not present

## 2016-06-10 DIAGNOSIS — F1010 Alcohol abuse, uncomplicated: Secondary | ICD-10-CM | POA: Diagnosis not present

## 2016-06-10 DIAGNOSIS — F419 Anxiety disorder, unspecified: Secondary | ICD-10-CM | POA: Diagnosis not present

## 2016-06-12 DIAGNOSIS — M549 Dorsalgia, unspecified: Secondary | ICD-10-CM | POA: Diagnosis not present

## 2016-06-12 DIAGNOSIS — I10 Essential (primary) hypertension: Secondary | ICD-10-CM | POA: Diagnosis not present

## 2016-06-12 DIAGNOSIS — I69318 Other symptoms and signs involving cognitive functions following cerebral infarction: Secondary | ICD-10-CM | POA: Diagnosis not present

## 2016-06-12 DIAGNOSIS — R1310 Dysphagia, unspecified: Secondary | ICD-10-CM | POA: Diagnosis not present

## 2016-06-12 DIAGNOSIS — G4700 Insomnia, unspecified: Secondary | ICD-10-CM | POA: Diagnosis not present

## 2016-06-12 DIAGNOSIS — G8929 Other chronic pain: Secondary | ICD-10-CM | POA: Diagnosis not present

## 2016-06-12 DIAGNOSIS — D500 Iron deficiency anemia secondary to blood loss (chronic): Secondary | ICD-10-CM | POA: Diagnosis not present

## 2016-06-12 DIAGNOSIS — F418 Other specified anxiety disorders: Secondary | ICD-10-CM | POA: Diagnosis not present

## 2016-06-12 DIAGNOSIS — I69291 Dysphagia following other nontraumatic intracranial hemorrhage: Secondary | ICD-10-CM | POA: Diagnosis not present

## 2016-06-12 DIAGNOSIS — Z7409 Other reduced mobility: Secondary | ICD-10-CM | POA: Diagnosis not present

## 2016-06-12 DIAGNOSIS — F1010 Alcohol abuse, uncomplicated: Secondary | ICD-10-CM | POA: Diagnosis not present

## 2016-06-12 DIAGNOSIS — R110 Nausea: Secondary | ICD-10-CM | POA: Diagnosis not present

## 2016-06-13 DIAGNOSIS — R110 Nausea: Secondary | ICD-10-CM | POA: Diagnosis not present

## 2016-06-13 DIAGNOSIS — D500 Iron deficiency anemia secondary to blood loss (chronic): Secondary | ICD-10-CM | POA: Diagnosis not present

## 2016-06-13 DIAGNOSIS — M549 Dorsalgia, unspecified: Secondary | ICD-10-CM | POA: Diagnosis not present

## 2016-06-13 DIAGNOSIS — I69291 Dysphagia following other nontraumatic intracranial hemorrhage: Secondary | ICD-10-CM | POA: Diagnosis not present

## 2016-06-13 DIAGNOSIS — R1310 Dysphagia, unspecified: Secondary | ICD-10-CM | POA: Diagnosis not present

## 2016-06-13 DIAGNOSIS — G8929 Other chronic pain: Secondary | ICD-10-CM | POA: Diagnosis not present

## 2016-06-13 DIAGNOSIS — I10 Essential (primary) hypertension: Secondary | ICD-10-CM | POA: Diagnosis not present

## 2016-06-13 DIAGNOSIS — Z7409 Other reduced mobility: Secondary | ICD-10-CM | POA: Diagnosis not present

## 2016-06-13 DIAGNOSIS — I69318 Other symptoms and signs involving cognitive functions following cerebral infarction: Secondary | ICD-10-CM | POA: Diagnosis not present

## 2016-06-13 DIAGNOSIS — G4700 Insomnia, unspecified: Secondary | ICD-10-CM | POA: Diagnosis not present

## 2016-06-13 DIAGNOSIS — F1010 Alcohol abuse, uncomplicated: Secondary | ICD-10-CM | POA: Diagnosis not present

## 2016-06-13 DIAGNOSIS — F418 Other specified anxiety disorders: Secondary | ICD-10-CM | POA: Diagnosis not present

## 2016-06-14 DIAGNOSIS — G8929 Other chronic pain: Secondary | ICD-10-CM | POA: Diagnosis not present

## 2016-06-14 DIAGNOSIS — Z7409 Other reduced mobility: Secondary | ICD-10-CM | POA: Diagnosis not present

## 2016-06-14 DIAGNOSIS — F418 Other specified anxiety disorders: Secondary | ICD-10-CM | POA: Diagnosis not present

## 2016-06-14 DIAGNOSIS — R197 Diarrhea, unspecified: Secondary | ICD-10-CM | POA: Diagnosis not present

## 2016-06-14 DIAGNOSIS — I10 Essential (primary) hypertension: Secondary | ICD-10-CM | POA: Diagnosis not present

## 2016-06-14 DIAGNOSIS — I63532 Cerebral infarction due to unspecified occlusion or stenosis of left posterior cerebral artery: Secondary | ICD-10-CM | POA: Diagnosis not present

## 2016-06-14 DIAGNOSIS — D62 Acute posthemorrhagic anemia: Secondary | ICD-10-CM | POA: Diagnosis not present

## 2016-06-14 DIAGNOSIS — G4700 Insomnia, unspecified: Secondary | ICD-10-CM | POA: Diagnosis not present

## 2016-06-14 DIAGNOSIS — R41841 Cognitive communication deficit: Secondary | ICD-10-CM | POA: Diagnosis not present

## 2016-06-14 DIAGNOSIS — M549 Dorsalgia, unspecified: Secondary | ICD-10-CM | POA: Diagnosis not present

## 2016-06-14 DIAGNOSIS — F1010 Alcohol abuse, uncomplicated: Secondary | ICD-10-CM | POA: Diagnosis not present

## 2016-06-14 DIAGNOSIS — R1310 Dysphagia, unspecified: Secondary | ICD-10-CM | POA: Diagnosis not present

## 2016-06-15 DIAGNOSIS — F1010 Alcohol abuse, uncomplicated: Secondary | ICD-10-CM | POA: Diagnosis not present

## 2016-06-15 DIAGNOSIS — I10 Essential (primary) hypertension: Secondary | ICD-10-CM | POA: Diagnosis not present

## 2016-06-15 DIAGNOSIS — D649 Anemia, unspecified: Secondary | ICD-10-CM | POA: Diagnosis not present

## 2016-06-15 DIAGNOSIS — F419 Anxiety disorder, unspecified: Secondary | ICD-10-CM | POA: Diagnosis not present

## 2016-06-15 DIAGNOSIS — F329 Major depressive disorder, single episode, unspecified: Secondary | ICD-10-CM | POA: Diagnosis not present

## 2016-06-15 DIAGNOSIS — R110 Nausea: Secondary | ICD-10-CM | POA: Diagnosis not present

## 2016-06-15 DIAGNOSIS — Z7982 Long term (current) use of aspirin: Secondary | ICD-10-CM | POA: Diagnosis not present

## 2016-06-15 DIAGNOSIS — R1310 Dysphagia, unspecified: Secondary | ICD-10-CM | POA: Diagnosis not present

## 2016-06-15 DIAGNOSIS — Z7409 Other reduced mobility: Secondary | ICD-10-CM | POA: Diagnosis not present

## 2016-06-15 DIAGNOSIS — R197 Diarrhea, unspecified: Secondary | ICD-10-CM | POA: Diagnosis not present

## 2016-06-15 DIAGNOSIS — I69318 Other symptoms and signs involving cognitive functions following cerebral infarction: Secondary | ICD-10-CM | POA: Diagnosis not present

## 2016-06-15 DIAGNOSIS — G4700 Insomnia, unspecified: Secondary | ICD-10-CM | POA: Diagnosis not present

## 2016-06-16 ENCOUNTER — Encounter: Payer: PPO | Attending: Physical Medicine & Rehabilitation | Admitting: Physical Medicine & Rehabilitation

## 2016-06-16 DIAGNOSIS — M6281 Muscle weakness (generalized): Secondary | ICD-10-CM | POA: Diagnosis not present

## 2016-06-16 DIAGNOSIS — G8929 Other chronic pain: Secondary | ICD-10-CM | POA: Insufficient documentation

## 2016-06-16 DIAGNOSIS — I10 Essential (primary) hypertension: Secondary | ICD-10-CM | POA: Diagnosis not present

## 2016-06-16 DIAGNOSIS — R197 Diarrhea, unspecified: Secondary | ICD-10-CM | POA: Diagnosis not present

## 2016-06-16 DIAGNOSIS — F3289 Other specified depressive episodes: Secondary | ICD-10-CM | POA: Diagnosis not present

## 2016-06-16 DIAGNOSIS — F0150 Vascular dementia without behavioral disturbance: Secondary | ICD-10-CM | POA: Diagnosis not present

## 2016-06-16 DIAGNOSIS — E785 Hyperlipidemia, unspecified: Secondary | ICD-10-CM | POA: Diagnosis not present

## 2016-06-16 DIAGNOSIS — R1310 Dysphagia, unspecified: Secondary | ICD-10-CM | POA: Diagnosis not present

## 2016-06-16 DIAGNOSIS — G4700 Insomnia, unspecified: Secondary | ICD-10-CM | POA: Diagnosis not present

## 2016-06-16 DIAGNOSIS — R278 Other lack of coordination: Secondary | ICD-10-CM | POA: Diagnosis not present

## 2016-06-16 DIAGNOSIS — D500 Iron deficiency anemia secondary to blood loss (chronic): Secondary | ICD-10-CM | POA: Diagnosis not present

## 2016-06-16 DIAGNOSIS — M546 Pain in thoracic spine: Secondary | ICD-10-CM | POA: Insufficient documentation

## 2016-06-16 DIAGNOSIS — D6489 Other specified anemias: Secondary | ICD-10-CM | POA: Diagnosis not present

## 2016-06-16 DIAGNOSIS — I639 Cerebral infarction, unspecified: Secondary | ICD-10-CM | POA: Diagnosis not present

## 2016-06-16 DIAGNOSIS — R2689 Other abnormalities of gait and mobility: Secondary | ICD-10-CM | POA: Diagnosis not present

## 2016-06-16 DIAGNOSIS — I69398 Other sequelae of cerebral infarction: Secondary | ICD-10-CM | POA: Diagnosis not present

## 2016-06-16 DIAGNOSIS — K529 Noninfective gastroenteritis and colitis, unspecified: Secondary | ICD-10-CM | POA: Insufficient documentation

## 2016-06-16 DIAGNOSIS — K219 Gastro-esophageal reflux disease without esophagitis: Secondary | ICD-10-CM | POA: Diagnosis not present

## 2016-06-16 DIAGNOSIS — M545 Low back pain: Secondary | ICD-10-CM | POA: Diagnosis not present

## 2016-06-16 DIAGNOSIS — F419 Anxiety disorder, unspecified: Secondary | ICD-10-CM | POA: Diagnosis not present

## 2016-06-16 DIAGNOSIS — F1010 Alcohol abuse, uncomplicated: Secondary | ICD-10-CM | POA: Diagnosis not present

## 2016-06-16 DIAGNOSIS — F338 Other recurrent depressive disorders: Secondary | ICD-10-CM | POA: Diagnosis not present

## 2016-06-16 DIAGNOSIS — M109 Gout, unspecified: Secondary | ICD-10-CM | POA: Diagnosis not present

## 2016-06-16 DIAGNOSIS — I638 Other cerebral infarction: Secondary | ICD-10-CM | POA: Diagnosis not present

## 2016-06-16 DIAGNOSIS — F418 Other specified anxiety disorders: Secondary | ICD-10-CM | POA: Diagnosis not present

## 2016-06-23 ENCOUNTER — Encounter (INDEPENDENT_AMBULATORY_CARE_PROVIDER_SITE_OTHER): Payer: Self-pay

## 2016-07-05 DIAGNOSIS — Z8673 Personal history of transient ischemic attack (TIA), and cerebral infarction without residual deficits: Secondary | ICD-10-CM | POA: Diagnosis not present

## 2016-07-05 DIAGNOSIS — I10 Essential (primary) hypertension: Secondary | ICD-10-CM | POA: Diagnosis not present

## 2016-07-05 DIAGNOSIS — Z23 Encounter for immunization: Secondary | ICD-10-CM | POA: Diagnosis not present

## 2016-07-05 DIAGNOSIS — M1990 Unspecified osteoarthritis, unspecified site: Secondary | ICD-10-CM | POA: Diagnosis not present

## 2016-07-05 DIAGNOSIS — M545 Low back pain: Secondary | ICD-10-CM | POA: Diagnosis not present

## 2016-07-11 DIAGNOSIS — Z7409 Other reduced mobility: Secondary | ICD-10-CM | POA: Diagnosis not present

## 2016-07-11 DIAGNOSIS — Z7982 Long term (current) use of aspirin: Secondary | ICD-10-CM | POA: Diagnosis not present

## 2016-07-11 DIAGNOSIS — R29818 Other symptoms and signs involving the nervous system: Secondary | ICD-10-CM | POA: Diagnosis not present

## 2016-07-11 DIAGNOSIS — R4189 Other symptoms and signs involving cognitive functions and awareness: Secondary | ICD-10-CM | POA: Diagnosis not present

## 2016-07-13 ENCOUNTER — Ambulatory Visit (HOSPITAL_COMMUNITY): Payer: PPO | Attending: Pulmonary Disease | Admitting: Physical Therapy

## 2016-07-13 DIAGNOSIS — M6281 Muscle weakness (generalized): Secondary | ICD-10-CM | POA: Diagnosis not present

## 2016-07-13 NOTE — Therapy (Signed)
strength  of LE so that patient is able to walk up to 30 minutes a day.    Time 1   Period Days   Status New                  Plan - 07/13/16 1215    Clinical Impression Statement [name] is a 65 yo female who had a stroke, she thinks in July but is unsure.  She stayed two months at a SNF and was discharged to home without follow up therapy.  She has been at her home for approximately two weeks now and asked her MD to refer her to therapy due to having memory issues and difficulty writing.  She has a history of spinal stenosis and was took therapy at [facility] for this prior to her stroke.  She feels her balance and walking is about where it was prior to her CVA.  Examination demonstrates functional but weakened hip mm; decreased but functional balance..  The therapist and pt agreed that this is most likely her pre CVA status.  She was given a HEP for hip strengthening.  An order will be sent to the referring physician for occupational and speech therapy to work on her cognitive and writing skills.    PT Frequency One time visit   PT Treatment/Interventions Patient/family education   PT Next Visit Plan Discharge to HEP    Consulted and Agree with Plan of Care Patient      Patient will benefit from skilled therapeutic intervention in order to improve the following deficits and impairments:  Decreased balance, Decreased strength  Visit Diagnosis: Muscle weakness (generalized) - Plan: PT plan of care cert/re-cert      G-Codes - [id number] 1222    Functional Assessment Tool Used Berg Balance, mm testing    Functional Limitation Mobility: Walking and moving around   Mobility: Walking and Moving Around Current Status [phone]) At least 1 percent but less than 20 percent impaired, limited or restricted   Mobility: Walking and Moving Around Goal Status [phone]) At least 1 percent but less than 20 percent impaired, limited or restricted   Mobility: Walking and Moving Around Discharge Status [phone])  At least 1 percent but less than 20 percent impaired, limited or restricted       Problem List Patient Active Problem List   Diagnosis Date Noted  . Pelvic pain in female 11/19/2015  . Diarrhea 11/01/2015  . Arthritis of knee 06/24/2013  . HYPERKALEMIA 07/07/2009  . HYPERTENSION, UNSPECIFIED 07/07/2009  . BRADYCARDIA 07/07/2009  . DIASTOLIC HEART FAILURE, CHRONIC 07/07/2009  . DYSPNEA 07/07/2009    Rayetta Humphrey, PT CLT [phone] 07/13/2016, 12:25 PM  [facility] [address] [place], [place], [address] Phone: [phone]   Fax:  [phone]  Name: [name] MRN: [id number] Date of Birth: 11-Oct-1951  strength  of LE so that patient is able to walk up to 30 minutes a day.    Time 1   Period Days   Status New                  Plan - 07/13/16 1215    Clinical Impression Statement [name] is a 65 yo female who had a stroke, she thinks in July but is unsure.  She stayed two months at a SNF and was discharged to home without follow up therapy.  She has been at her home for approximately two weeks now and asked her MD to refer her to therapy due to having memory issues and difficulty writing.  She has a history of spinal stenosis and was took therapy at [facility] for this prior to her stroke.  She feels her balance and walking is about where it was prior to her CVA.  Examination demonstrates functional but weakened hip mm; decreased but functional balance..  The therapist and pt agreed that this is most likely her pre CVA status.  She was given a HEP for hip strengthening.  An order will be sent to the referring physician for occupational and speech therapy to work on her cognitive and writing skills.    PT Frequency One time visit   PT Treatment/Interventions Patient/family education   PT Next Visit Plan Discharge to HEP    Consulted and Agree with Plan of Care Patient      Patient will benefit from skilled therapeutic intervention in order to improve the following deficits and impairments:  Decreased balance, Decreased strength  Visit Diagnosis: Muscle weakness (generalized) - Plan: PT plan of care cert/re-cert      G-Codes - A999333 1222    Functional Assessment Tool Used Berg Balance, mm testing    Functional Limitation Mobility: Walking and moving around   Mobility: Walking and Moving Around Current Status [phone]) At least 1 percent but less than 20 percent impaired, limited or restricted   Mobility: Walking and Moving Around Goal Status [phone]) At least 1 percent but less than 20 percent impaired, limited or restricted   Mobility: Walking and Moving Around Discharge Status [phone])  At least 1 percent but less than 20 percent impaired, limited or restricted       Problem List Patient Active Problem List   Diagnosis Date Noted  . Pelvic pain in female 11/19/2015  . Diarrhea 11/01/2015  . Arthritis of knee 06/24/2013  . HYPERKALEMIA 07/07/2009  . HYPERTENSION, UNSPECIFIED 07/07/2009  . BRADYCARDIA 07/07/2009  . DIASTOLIC HEART FAILURE, CHRONIC 07/07/2009  . DYSPNEA 07/07/2009    Rayetta Humphrey, PT CLT [phone] 07/13/2016, 12:25 PM  [facility] [address] [place], [place], [address] Phone: [phone]   Fax:  [phone]  Name: [name] MRN: [id number] Date of Birth: 11-Oct-1951  [facility] [address] [place], [place], [address] Phone: [phone]   Fax:  [phone]  Physical Therapy Evaluation  Patient Details  Name: [name] MRN: [id number] Date of Birth: [birth year] Referring Provider: Dr. Sinda Du  Encounter Date: 07/13/2016      PT End of Session - 07/13/16 1214    Visit Number 1   Number of Visits 1   Authorization Type [employer]   Authorization - Visit Number 1   Authorization - Number of Visits 1   PT Start Time 1120   PT Stop Time [id number]   PT Time Calculation (min) 44 min   Activity Tolerance Patient tolerated treatment well   Behavior During Therapy [facility] for tasks assessed/performed      Past Medical History:  Diagnosis Date  . Anxiety   . Arthritis   . Depression   . Hypertension   . Panic attacks     Past Surgical History:  Procedure Laterality Date  . BIOPSY N/A 12/02/2015   Procedure: BIOPSY;  Surgeon: Rogene Houston, MD;  [facility];  Service: Endoscopy;  Laterality: N/A;  Random colon biopsies  . CHOLECYSTECTOMY    . COLONOSCOPY    . COLONOSCOPY N/A 12/02/2015   Procedure: COLONOSCOPY;  Surgeon: Rogene Houston, MD;  Location[facility];  Service: Endoscopy;  Laterality: N/A;  110  . EYE SURGERY     as a child  . GASTRIC BYPASS     in 1985 for obesity  . POLYPECTOMY N/A 12/02/2015   Procedure: POLYPECTOMY;  Surgeon: Rogene Houston, MD;  Location: [facility];  Service: Endoscopy;  Laterality: N/A;  Splenic flexure polyp  . right foot surgery      There were no vitals filed for this visit.       Subjective Assessment - 07/13/16 1126    Subjective Ms. Besecker states that she had a stroke about two months ago.  She was admitted to [facility] and went to [facility] for two months and was discharged to home for a couple of weeks but she is not functioning at the level she would like to be therefore she is being referred to skilled  physical therapy.  She states that she is not talking like she was( having difficulty with her words); she is having difficuly writing, .  She feels that her walking is back to where she was prior to the stroke.     Pertinent History CVA in July; spinal stenosis    How long can you sit comfortably? no problem    How long can you stand comfortably? Pt is just starting to cook not    How long can you walk comfortably? 30 minutes    Patient Stated Goals talk better, write better and get back to church    Currently in Pain? No/denies            [facility] PT Assessment - 07/13/16 0001      Assessment   Medical Diagnosis CVA   Referring Provider Dr. Sinda Du   Onset Date/Surgical Date --  sometime in July    Next MD Visit not scheduled   Prior Therapy acute and SNF for stroke; OP for stenosis      Precautions   Precautions None     Restrictions   Weight Bearing Restrictions No     Balance Screen   Has the patient fallen in the past 6 months No

## 2016-07-13 NOTE — Patient Instructions (Addendum)
Strengthening: Straight Leg Raise (Phase 1)    Tighten muscles on front of right thigh, then lift leg _12___ inches from surface, keeping knee locked.  Repeat 10____ times per set. Do 1____ sets per session. Do 1-2____ sessions per day.  http://orth.exer.us/614   Copyright  VHI. All rights reserved.  Strengthening: Hip Abduction (Side-Lying)    Tighten muscles on front of left thigh, then lift leg __10__ inches from surface, keeping knee locked.  Repeat 10____ times per set. Do _1___ sets per session. Do _1-2___ sessions per day.  http://orth.exer.us/622   Copyright  VHI. All rights reserved.  Straight Leg Raise (Prone)    Abdomen and head supported, keep left knee locked and raise leg at hip. Avoid arching low back. Repeat __10__ times per set. Do _1___ sets per session. Do _1-2___ sessions per day.  http://orth.exer.us/1112   Copyright  VHI. All rights reserved.

## 2016-07-20 ENCOUNTER — Encounter (HOSPITAL_COMMUNITY): Payer: PPO | Admitting: Speech Pathology

## 2016-07-24 ENCOUNTER — Ambulatory Visit (HOSPITAL_COMMUNITY): Payer: PPO | Attending: Pulmonary Disease | Admitting: Speech Pathology

## 2016-07-24 ENCOUNTER — Encounter (HOSPITAL_COMMUNITY): Payer: Self-pay | Admitting: Speech Pathology

## 2016-07-24 DIAGNOSIS — R41841 Cognitive communication deficit: Secondary | ICD-10-CM | POA: Insufficient documentation

## 2016-07-24 DIAGNOSIS — R278 Other lack of coordination: Secondary | ICD-10-CM | POA: Insufficient documentation

## 2016-07-24 NOTE — Therapy (Addendum)
[facility] [address] [place], [place], [address] Phone: [phone]   Fax:  [phone]  Speech Language Pathology Evaluation  Patient Details  Name: [name] MRN: [id number] Date of Birth: Jan 27, 1951 Referring Provider: Kari Baars  Encounter Date: 07/24/2016      End of Session - 07/24/16 1509    Visit Number 1   Number of Visits 8   Date for SLP Re-Evaluation 08/24/16   Authorization Type [employer]   SLP Start Time 1355   SLP Stop Time  1442   SLP Time Calculation (min) 47 min   Activity Tolerance Patient tolerated treatment well      Past Medical History:  Diagnosis Date  . Anxiety   . Arthritis   . Depression   . Hypertension   . Panic attacks     Past Surgical History:  Procedure Laterality Date  . BIOPSY N/A 12/02/2015   Procedure: BIOPSY;  Surgeon: Malissa Hippo, MD;  [facility];  Service: Endoscopy;  Laterality: N/A;  Random colon biopsies  . CHOLECYSTECTOMY    . COLONOSCOPY    . COLONOSCOPY N/A 12/02/2015   Procedure: COLONOSCOPY;  Surgeon: Malissa Hippo, MD;  [facility];  Service: Endoscopy;  Laterality: N/A;  110  . EYE SURGERY     as a child  . GASTRIC BYPASS     in 1985 for obesity  . POLYPECTOMY N/A 12/02/2015   Procedure: POLYPECTOMY;  Surgeon: Malissa Hippo, MD;  [facility];  Service: Endoscopy;  Laterality: N/A;  Splenic flexure polyp  . right foot surgery      There were no vitals filed for this visit.      Subjective Assessment - 07/24/16 1421    Subjective "I want to work on my speech... getting my words out."   Currently in Pain? No/denies            SLP Evaluation [facility] - 07/24/16 1421      SLP Visit Information   SLP Received On 07/24/16   Referring Provider Kari Baars   Onset Date 05/26/2016   Medical Diagnosis Left CVA     Subjective   Subjective "Everytime I try to copy it, it moves."   Patient/Family  Stated Goal "Work on my speech."     General Information   HPI [name] is a 65 y/o right handed female with PMHx significant for hypertension, depression, anxiety, alcohol abuse, pernicious anemia, and chronic back pain who was transferred from [facility] ED on 05/26/16 after she was found down with melena. 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 She was also noted with low hemoglobin of 5 requiring transfusions and rhabdomyolysis. She was transferred to [facility] for further management. Upon admission she was febrile with lactic acidosis and CK of 1400. Workup revealed Klebsiella UTI for which she completed a 7 day course of Rocephin. For stroke workup she underwent MRI brain revealing a left posterior MCA and watershed territory infarct with additional multifocal infarcts involving the left frontal lobe, bilateral basal ganglia and right temporal lobe consistent with a proximal embolic source. TTE did not showed any embolic source or interatrial shunt. Neurology was consulted for her stroke and recommended Aspirin and statin for secondary stroke prevention. GI was consulted to evaluate for GI bleed. EGD was done on 8/16 which was normal. GI bleed was felt to be secondary to medications including Goodies and Meloxicam for back pain. Hospital course  was complicated with visual hallucinations and anxiety. As a result of her stroke, the patient suffered from right sided weakness, aphasia, and visual deficits impairing her mobility, ADLs, swallowing and cognition. The patient was admitted to inpatient rehabilitation for comprehensive therapy and continued medical management from 06/05/16 to 06/16/16. The patient stayed at [facility] 06/16/16 to 06/30/2016. She was referred by her PCP, Dr. Juanetta Gosling for ongoing speech/language therapy as pt still presenting with cognitive communication deficits.    Behavioral/Cognition Alert and cooperative   Mobility Status Ambulatory     Prior Functional Status    Cognitive/Linguistic Baseline Within functional limits   Type of Home House    Lives With Spouse   Available Support Family   Education some college   Vocation Retired  Previously worked at [employer]     Cognition   Overall Cognitive Status Impaired/Different from baseline   Area of Impairment Memory;Attention   Current Attention Level Alternating   Attention Comments negatively impacated by processing and auditory comprehension   Memory Decreased short-term memory   Memory Impaired   Memory Impairment Retrieval deficit   Awareness Appears intact   Problem Solving Appears intact     Auditory Comprehension   Overall Auditory Comprehension Impaired   Yes/No Questions Within Functional Limits   Commands Impaired   Two Step Basic Commands 75-100% accurate   Multistep Basic Commands 50-74% accurate   Conversation Complex   Interfering Components Processing speed;Working Careers information officer Within [employer]     Reading Comprehension   Reading Status Impaired   Word level 76-100% accurate   Sentence Level 76-100% accurate   Paragraph Level Not tested   Functional Environmental (signs, name badge) Within functional limits   Interfering Components Visual acuity;Visual perceptual   Effective Techniques Large print;Eye glasses;Visual cueing     Expression   Primary Mode of Expression Verbal     Verbal Expression   Overall Verbal Expression Impaired   Initiation No impairment   Automatic Speech Name;Social Response;Day of week   Level of Generative/Spontaneous Verbalization Conversation   Repetition No impairment   Naming Impairment   Responsive 76-100% accurate   Confrontation 75-100% accurate   Convergent 75-100% accurate   Divergent 0-24% accurate   Pragmatics No impairment   Effective Techniques Open ended questions    Non-Verbal Means of Communication Not applicable     Written Expression   Dominant Hand Right   Written Expression Exceptions to [facility]   Copy Ability --  difficulty copying 3 dimensional object   Self Formulation Ability Word     Oral Motor/Sensory Function   Overall Oral Motor/Sensory Function Appears within functional limits for tasks assessed   Labial ROM Within Functional Limits     Motor Speech   Overall Motor Speech Appears within functional limits for tasks assessed   Respiration Within functional limits   Phonation Normal   Resonance Within functional limits   Articulation Within functional limitis   Intelligibility Intelligible   Motor Planning Witnin functional limits   Motor Speech Errors Not applicable   Phonation WFL     Standardized Assessments   Standardized Assessments  Montreal Cognitive Assessment (MOCA)   Montreal Cognitive Assessment (MOCA)  15/30           SLP Education - 07/24/16 1509    Education provided Yes   Education Details Discussed plan for continued SLP therapy 2x/week for 4 weeks  Person(s) Educated Patient   Methods Explanation   Comprehension Verbalized understanding          SLP Short Term Goals - 07/24/16 1522      SLP SHORT TERM GOAL #1   Title Pt will increase recall of functional information to 90% acc with use of compensatory strategies as needed.   Baseline 0% without cues, 80% with cues   Time 4   Period Weeks   Status New     SLP SHORT TERM GOAL #2   Title Pt will increase recall of daily appointments, personal recent medical history, and general orientation to [facility] with use of written compensatory strategies as needed.   Baseline Pt not using a personal planning system at this time; Moderate impairment   Time 4   Period Weeks   Status New     SLP SHORT TERM GOAL #3   Title Pt will increase divergent naming to 7+ items per concrete category with min cues.   Baseline 3 items per category   Time 4   Period Weeks    Status New     SLP SHORT TERM GOAL #4   Title Pt will increase reading comprehension of paragraph length material to 90% with mi/mod cues and use of compensatory visual attention strategies.   Baseline 80% sentence length   Time 4   Period Weeks   Status New          SLP Long Term Goals - 07/24/16 1532      SLP LONG TERM GOAL #1   Title Increase cognitive linguistic skills to [facility] with use of strategies and min cues as needed.   Baseline mi/moderately impaired   Time 8   Period Weeks   Status New          Plan - 07/24/16 1510    Clinical Impression Statement Pt presents with mild/moderate cognitive linguistic deficits characterized by decreased auditory processing speed, decreased attention, decreased recall/retrieval of functional linguistic information, deficits in divergent naming and word finding, and decreased visual processing skills all which negatively impact independence with communication and memory skills. Pt is highly motivated to address these deficits and would benefit from skilled SLP services to increase independence, decrease burden of care, and improve quality of life. Recommend skilled SLP services 2x/week for 4 weeks with reassessment of needs at that time.    Speech Therapy Frequency 2x / week   Duration 4 weeks   Treatment/Interventions Compensatory techniques;Cueing hierarchy;Functional tasks;Patient/family education;Compensatory strategies;Internal/external aids;Cognitive reorganization;SLP instruction and feedback   Potential to Achieve Goals Good   Potential Considerations Ability to learn/carryover information   SLP Home Exercise Plan Pt will be independent with HEP to facilitate carryover of treatment strategies and techniques in home environment.   Consulted and Agree with Plan of Care Patient      Patient will benefit from skilled therapeutic intervention in order to improve the following deficits and impairments:   Cognitive communication deficit -  Plan: SLP plan of care cert/re-cert    Problem List Patient Active Problem List   Diagnosis Date Noted  . Pelvic pain in female 11/19/2015  . Diarrhea 11/01/2015  . Arthritis of knee 06/24/2013  . HYPERKALEMIA 07/07/2009  . HYPERTENSION, UNSPECIFIED 07/07/2009  . BRADYCARDIA 07/07/2009  . DIASTOLIC HEART FAILURE, CHRONIC 07/07/2009  . DYSPNEA 07/07/2009   Clinical judgment    Functional Limitations Memory   Memory Current Status [phone]) At least 20 percent but less than 40 percent impaired, limited or restricted  Memory Goal Status [phone]) At least 1 percent but less than 20 percent impaired, limited or restricted    Thank you,  Havery Moros, CCC-SLP [phone]  [facility] 07/24/2016, 3:36 PM  [facility] [address] [place], [place], [address] Phone: [phone]   Fax:  [phone]  Name: NORELL HENNINGSON MRN: [id number] Date of Birth: 1951/02/06

## 2016-07-27 ENCOUNTER — Ambulatory Visit (HOSPITAL_COMMUNITY): Payer: PPO

## 2016-07-27 ENCOUNTER — Encounter (HOSPITAL_COMMUNITY): Payer: Self-pay

## 2016-07-27 DIAGNOSIS — R278 Other lack of coordination: Secondary | ICD-10-CM

## 2016-07-27 DIAGNOSIS — R41841 Cognitive communication deficit: Secondary | ICD-10-CM | POA: Diagnosis not present

## 2016-07-28 NOTE — Therapy (Signed)
Plan - 07/28/16 0927    Clinical Impression Statement A: Pt is a 65 y/o female S/P left CVA with reports of decreased handwriting skills. Patient reports that her handwriting is legible although it is not what it used to look like. Handwriting sample was taken with patient to write her name and address on a piece of paper. patient was able to sign name although when trying to write her address she reports that she isn't able to. With increased time, patient was able to write her house number ([id number]), she was unable to write her street name although could spell it, and she could write her city and state with the zip code. Task took approximately 5 minutes to complete. Patient reports that she has periods of short term memory loss where her husband will tell her things she said and she'll have no memory of them. She is not safe to leave home without supervision due to her short term memory problems. Pt's strength and coordination assessed and they were WNL for her age.  Discussed that her difficulty writing is not due to physical deficits but cognitive difficulties in which she is having difficulty sending signals from her brain to her hand. The message is becoming jumbled and/or miscommunicated and it is not coming through as it usually does.    Rehab Potential Good   OT Frequency One time visit   OT Treatment/Interventions Patient/family education   Plan P: No OT follow services. Recommend continuing speech therapy to focus on coginitive deficits  and to increase functional performance during daily tasks.    Consulted and Agree with Plan of Care Patient      Patient will benefit from skilled therapeutic intervention in order to improve the following deficits and impairments:  Other (comment) (No skilled OT intervention warranted.)  Visit Diagnosis: Other lack of coordination - Plan: Ot plan of care cert/re-cert      G-Codes - [id number]    Functional Assessment Tool Used Clinical judgement   Functional Limitation Other OT primary   Other OT Primary Current Status [phone] At least 1 percent but less than 20 percent impaired, limited or restricted   Other OT Primary Goal Status [id number]) At least 1 percent but less than 20 percent impaired, limited or restricted   Other OT Primary Discharge Status [phone] At least 1 percent but less than 20 percent impaired, limited or restricted      Problem List Patient Active Problem List   Diagnosis Date Noted  . Pelvic pain in female 11/19/2015  . Diarrhea 11/01/2015  . Arthritis of knee 06/24/2013  . HYPERKALEMIA 07/07/2009  . HYPERTENSION, UNSPECIFIED 07/07/2009  . BRADYCARDIA 07/07/2009  . DIASTOLIC HEART FAILURE, CHRONIC 07/07/2009  . DYSPNEA 07/07/2009    Ailene Ravel, OTR/L,CBIS  [phone]   07/28/2016, 9:39 AM  [facility] [address] [place], [place], [address] Phone: [phone]   Fax:  [phone]  Name: [name] MRN: [id number] Date of Birth: 1951/06/30  [facility] [address] [place], [place], [address] Phone: [phone]   Fax:  [phone]  Occupational Therapy Evaluation  Patient Details  Name: [name] MRN: [id number] Date of Birth: [birth year] Referring Provider: Dr. Sinda Du  Encounter Date: 07/27/2016      OT End of Session - 07/27/16 0925    Visit Number 1   Number of Visits 1   Authorization Type [employer]   Authorization Time Period before 10th visit   Authorization - Visit Number 1   Authorization - Number of Visits 10   OT Start Time [id number]   OT Stop Time 1430   OT Time Calculation (min) 45 min   Activity Tolerance Patient tolerated treatment well   Behavior During Therapy [facility] for tasks assessed/performed      Past Medical History:  Diagnosis Date  . Anxiety   . Arthritis   . Depression   . Hypertension   . Panic attacks     Past Surgical History:  Procedure Laterality Date  . BIOPSY N/A 12/02/2015   Procedure: BIOPSY;  Surgeon: Rogene Houston, MD;  [facility];  Service: Endoscopy;  Laterality: N/A;  Random colon biopsies  . CHOLECYSTECTOMY    . COLONOSCOPY    . COLONOSCOPY N/A 12/02/2015   Procedure: COLONOSCOPY;  Surgeon: Rogene Houston, MD;  [facility];  Service: Endoscopy;  Laterality: N/A;  110  . EYE SURGERY     as a child  . GASTRIC BYPASS     in 1985 for obesity  . POLYPECTOMY N/A 12/02/2015   Procedure: POLYPECTOMY;  Surgeon: Rogene Houston, MD;  [facility];  Service: Endoscopy;  Laterality: N/A;  Splenic flexure polyp  . right foot surgery      There were no vitals filed for this visit.      Subjective Assessment - 07/27/16 1357    Subjective  S: I don't write like I used it. It's neat and legible but it doesn't look like my writing.    Pertinent History [name] is a 65 y/o right handed female with PMHx significant for hypertension, depression, anxiety, alcohol abuse,  pernicious anemia, and chronic back pain who was transferred from [facility] ED on 05/26/16 after she was found down with melena. 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 For stroke workup she underwent MRI brain revealing a left posterior MCA and watershed territory infarct with additional multifocal infarcts involving the left frontal lobe, bilateral basal ganglia and right temporal lobe consistent with a proximal embolic source. Dr. Luan Pulling has referred patient to occpational therapy for evaluation and treatment.    Special Tests MMT, grip and pinch strengthening   Patient Stated Goals Unsure.   Currently in Pain? No/denies           [facility] OT Assessment - 07/27/16 1357      Assessment   Diagnosis Right side weakness S/P Left CVA   Referring Provider Dr. Sinda Du   Onset Date 05/26/16   Prior Therapy [facility] for 1 month     Precautions   Precautions None     Restrictions   Weight Bearing Restrictions No     Balance Screen   Has the patient fallen in the past 6 months Yes   How many times? 2   Has the patient had a decrease in activity level because of a fear of falling?  No   Is the patient  [facility] [address] [place], [place], [address] Phone: [phone]   Fax:  [phone]  Occupational Therapy Evaluation  Patient Details  Name: [name] MRN: [id number] Date of Birth: [birth year] Referring Provider: Dr. Sinda Du  Encounter Date: 07/27/2016      OT End of Session - 07/27/16 0925    Visit Number 1   Number of Visits 1   Authorization Type [employer]   Authorization Time Period before 10th visit   Authorization - Visit Number 1   Authorization - Number of Visits 10   OT Start Time [id number]   OT Stop Time 1430   OT Time Calculation (min) 45 min   Activity Tolerance Patient tolerated treatment well   Behavior During Therapy [facility] for tasks assessed/performed      Past Medical History:  Diagnosis Date  . Anxiety   . Arthritis   . Depression   . Hypertension   . Panic attacks     Past Surgical History:  Procedure Laterality Date  . BIOPSY N/A 12/02/2015   Procedure: BIOPSY;  Surgeon: Rogene Houston, MD;  [facility];  Service: Endoscopy;  Laterality: N/A;  Random colon biopsies  . CHOLECYSTECTOMY    . COLONOSCOPY    . COLONOSCOPY N/A 12/02/2015   Procedure: COLONOSCOPY;  Surgeon: Rogene Houston, MD;  [facility];  Service: Endoscopy;  Laterality: N/A;  110  . EYE SURGERY     as a child  . GASTRIC BYPASS     in 1985 for obesity  . POLYPECTOMY N/A 12/02/2015   Procedure: POLYPECTOMY;  Surgeon: Rogene Houston, MD;  [facility];  Service: Endoscopy;  Laterality: N/A;  Splenic flexure polyp  . right foot surgery      There were no vitals filed for this visit.      Subjective Assessment - 07/27/16 1357    Subjective  S: I don't write like I used it. It's neat and legible but it doesn't look like my writing.    Pertinent History [name] is a 65 y/o right handed female with PMHx significant for hypertension, depression, anxiety, alcohol abuse,  pernicious anemia, and chronic back pain who was transferred from [facility] ED on 05/26/16 after she was found down with melena. 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 For stroke workup she underwent MRI brain revealing a left posterior MCA and watershed territory infarct with additional multifocal infarcts involving the left frontal lobe, bilateral basal ganglia and right temporal lobe consistent with a proximal embolic source. Dr. Luan Pulling has referred patient to occpational therapy for evaluation and treatment.    Special Tests MMT, grip and pinch strengthening   Patient Stated Goals Unsure.   Currently in Pain? No/denies           [facility] OT Assessment - 07/27/16 1357      Assessment   Diagnosis Right side weakness S/P Left CVA   Referring Provider Dr. Sinda Du   Onset Date 05/26/16   Prior Therapy [facility] for 1 month     Precautions   Precautions None     Restrictions   Weight Bearing Restrictions No     Balance Screen   Has the patient fallen in the past 6 months Yes   How many times? 2   Has the patient had a decrease in activity level because of a fear of falling?  No   Is the patient

## 2016-08-01 ENCOUNTER — Encounter (INDEPENDENT_AMBULATORY_CARE_PROVIDER_SITE_OTHER): Payer: Self-pay | Admitting: Internal Medicine

## 2016-08-01 ENCOUNTER — Ambulatory Visit (HOSPITAL_COMMUNITY): Payer: PPO | Admitting: Speech Pathology

## 2016-08-01 ENCOUNTER — Ambulatory Visit (INDEPENDENT_AMBULATORY_CARE_PROVIDER_SITE_OTHER): Payer: PPO | Admitting: Internal Medicine

## 2016-08-01 ENCOUNTER — Encounter (HOSPITAL_COMMUNITY): Payer: Self-pay | Admitting: Speech Pathology

## 2016-08-01 DIAGNOSIS — R41841 Cognitive communication deficit: Secondary | ICD-10-CM | POA: Diagnosis not present

## 2016-08-01 NOTE — Therapy (Signed)
[facility] [address] [place], [place], [address] Phone: [phone]   Fax:  [phone]  Speech Language Pathology Treatment  Patient Details  Name: [name] MRN: [id number] Date of Birth: 02-16-1951 Referring Provider: Kari Chen  Encounter Date: 08/01/2016      End of Session - 08/01/16 2111    Visit Number 2   Number of Visits 8   Date for SLP Re-Evaluation 08/24/16   Authorization Type [employer]   SLP Start Time 1345   SLP Stop Time  1430   SLP Time Calculation (min) 45 min   Activity Tolerance Patient tolerated treatment well      Past Medical History:  Diagnosis Date  . Anxiety   . Arthritis   . Depression   . Hypertension   . Panic attacks     Past Surgical History:  Procedure Laterality Date  . BIOPSY N/A 12/02/2015   Procedure: BIOPSY;  Surgeon: Molly Hippo, MD;  [facility];  Service: Endoscopy;  Laterality: N/A;  Random colon biopsies  . CHOLECYSTECTOMY    . COLONOSCOPY    . COLONOSCOPY N/A 12/02/2015   Procedure: COLONOSCOPY;  Surgeon: Molly Hippo, MD;  [facility];  Service: Endoscopy;  Laterality: N/A;  110  . EYE SURGERY     as a child  . GASTRIC BYPASS     in 1985 for obesity  . POLYPECTOMY N/A 12/02/2015   Procedure: POLYPECTOMY;  Surgeon: Molly Hippo, MD;  [facility];  Service: Endoscopy;  Laterality: N/A;  Splenic flexure polyp  . right foot surgery      There were no vitals filed for this visit.      Subjective Assessment - 08/01/16 1355    Subjective "I got my first driving experience yesterday."   Currently in Pain? No/denies               ADULT SLP TREATMENT - 08/01/16 1405      General Information   Behavior/Cognition Alert;Cooperative;Pleasant mood   Patient Positioning Upright in chair   HPI [name] is a 65 y/o right handed female with PMHx significant for hypertension, depression, anxiety, alcohol  abuse, pernicious anemia, and chronic back pain who was transferred from [facility] ED on 05/26/16 after she was found down with melena. 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 She was also noted with low hemoglobin of 5 requiring transfusions and rhabdomyolysis. She was transferred to [facility] for further management. Upon admission she was febrile with lactic acidosis and CK of 1400. Workup revealed [name] UTI for which she completed a 7 day course of Rocephin. For stroke workup she underwent MRI brain revealing a left posterior MCA and watershed territory infarct with additional multifocal infarcts involving the left frontal lobe, bilateral basal ganglia and right temporal lobe consistent with a proximal embolic source. TTE did not showed any embolic source or interatrial shunt. Neurology was consulted for her stroke and recommended Aspirin and statin for secondary stroke prevention. GI was consulted to evaluate for GI bleed. EGD was done on 8/16 which was normal. GI bleed was felt to be secondary to medications including Goodies and Meloxicam for back pain. Hospital course was complicated with visual hallucinations and anxiety. As a result of her stroke, the patient suffered from right sided weakness, aphasia, and visual deficits impairing her mobility, ADLs, swallowing and cognition. The patient was admitted to inpatient rehabilitation for comprehensive therapy and continued medical  management from 06/05/16 to 06/16/16. The patient stayed at [facility] 06/16/16 to 06/30/2016. She was referred by her PCP, Dr. Juanetta Chen for ongoing speech/language therapy as pt still presenting with cognitive communication deficits.      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   Baseline 0% without cues, 80% with cues   Time 4   Period Weeks   Status On-going     SLP SHORT TERM GOAL #2   Title Pt will increase recall of daily appointments, personal recent medical history, and general orientation to [facility] with use of written compensatory strategies as needed.   Baseline Pt not using a personal planning system at this time; Moderate impairment   Time 4   Period Weeks   Status On-going     SLP SHORT TERM GOAL #3   Title Pt will increase divergent naming to 7+ items per concrete category with min cues.   Baseline 3 items per category   Time 4   Period Weeks   Status On-going     SLP SHORT TERM GOAL #4   Title Pt will increase reading comprehension of paragraph length material to 90% with mi/mod cues and use of compensatory visual attention strategies.   Baseline 80% sentence length   Time 4   Period Weeks          SLP Long Term Goals - 08/01/16 2112      SLP LONG TERM GOAL #1   Title Increase cognitive linguistic skills to [facility] with use of strategies and min cues as needed.   Baseline mi/moderately impaired   Time 8   Period Weeks   Status On-going          Plan - 08/01/16 2112    Clinical Impression Statement Pt arrived a few minutes late for therapy this date. She reported that she drove her car yesterday, which upset her husband. She was given a homework folder and written strategies regarding word finding and memory strategies. She expressed frustration over her loss of independence and need to be "driven  around" by her husband. Further assessment of reading and writing skills completed this date. She was able to label 6/6 letters from the [place] Aphasia Battery card, but only able to identify/point to 2/6 named letters. She was able to read single words with 85% acc with visual cue. She had difficulty writing self generated information beyond her name and address (mi/mod cues). Homework was provided and will review next session.    Speech Therapy Frequency 2x / week   Duration 4 weeks   Treatment/Interventions Compensatory techniques;Cueing hierarchy;Functional tasks;Patient/family education;Compensatory strategies;Internal/external aids;Cognitive reorganization;SLP instruction and feedback   Potential to Achieve Goals Good   Potential Considerations Ability to learn/carryover information   SLP Home Exercise Plan Pt  will be independent with HEP to facilitate carryover of treatment strategies and techniques in home environment.   Consulted and Agree with Plan of Care Patient      Patient will benefit from skilled therapeutic intervention in order to improve the following deficits and impairments:   Cognitive communication deficit    Problem List Patient Active Problem List   Diagnosis Date Noted  . Pelvic pain in female 11/19/2015  . Diarrhea 11/01/2015  . Arthritis of knee 06/24/2013  . HYPERKALEMIA 07/07/2009  . HYPERTENSION, UNSPECIFIED 07/07/2009  . BRADYCARDIA 07/07/2009  . DIASTOLIC HEART FAILURE, CHRONIC 07/07/2009  . DYSPNEA 07/07/2009   Thank you,  Havery Moros, CCC-SLP [phone]  [facility] 08/01/2016, 9:14 PM  [facility] [address] [place], [place], [address] Phone: [phone]   Fax:  [phone]   Name: [name] MRN: [id number] Date of Birth: [birth year]

## 2016-08-03 ENCOUNTER — Encounter (HOSPITAL_COMMUNITY): Payer: Self-pay | Admitting: Speech Pathology

## 2016-08-03 ENCOUNTER — Ambulatory Visit (HOSPITAL_COMMUNITY): Payer: PPO | Admitting: Speech Pathology

## 2016-08-03 DIAGNOSIS — R41841 Cognitive communication deficit: Secondary | ICD-10-CM | POA: Diagnosis not present

## 2016-08-03 NOTE — Therapy (Signed)
[facility] [address] [place], [place], [address] Phone: [phone]   Fax:  [phone]  Speech Language Pathology Treatment  Patient Details  Name: [name] MRN: [id number] Date of Birth: August 16, 1951 Referring Provider: Kari Baars  Encounter Date: 08/03/2016      End of Session - 08/03/16 1458    Visit Number 3   Number of Visits 8   Date for SLP Re-Evaluation 08/24/16   Authorization Type [employer]    SLP Start Time 1345   SLP Stop Time  1430   SLP Time Calculation (min) 45 min   Activity Tolerance Patient tolerated treatment well      Past Medical History:  Diagnosis Date  . Anxiety   . Arthritis   . Depression   . Hypertension   . Panic attacks     Past Surgical History:  Procedure Laterality Date  . BIOPSY N/A 12/02/2015   Procedure: BIOPSY;  Surgeon: Malissa Hippo, MD;  [facility];  Service: Endoscopy;  Laterality: N/A;  Random colon biopsies  . CHOLECYSTECTOMY    . COLONOSCOPY    . COLONOSCOPY N/A 12/02/2015   Procedure: COLONOSCOPY;  Surgeon: Malissa Hippo, MD;  [facility];  Service: Endoscopy;  Laterality: N/A;  110  . EYE SURGERY     as a child  . GASTRIC BYPASS     in 1985 for obesity  . POLYPECTOMY N/A 12/02/2015   Procedure: POLYPECTOMY;  Surgeon: Malissa Hippo, MD;  [facility];  Service: Endoscopy;  Laterality: N/A;  Splenic flexure polyp  . right foot surgery      There were no vitals filed for this visit.      Subjective Assessment - 08/03/16 1446    Subjective "It took me 2 days to complete my homework"    Special Tests short test of [place] Naming Test    Currently in Pain? 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 progress with ST, designed appropriate carryover activity       Assessment / Recommendations / Plan   Plan Continue with current plan of care     Progression Toward Goals   Progression toward goals Progressing toward goals          SLP Education - 08/03/16 1456    Education provided Yes   Education Details educated pt. about strategies and techniques to make gains with memory and generate writing tasks    Person(s) Educated Patient   Methods Explanation;Demonstration;Verbal cues   Comprehension Verbalized understanding          SLP Short Term Goals - 08/03/16 1524      SLP SHORT TERM GOAL #1   Title Pt will increase recall of functional information to 90% acc with use of compensatory strategies as needed.   Baseline 0%  without cues, 80% with cues   Time 4   Period Weeks   Status On-going     SLP SHORT TERM GOAL #2   Title Pt will increase recall of daily appointments, personal recent medical history, and general orientation to [facility] with use of written compensatory strategies as needed.   Baseline Pt not using a personal planning system at this time; Moderate impairment   Time 4   Period Weeks   Status On-going     SLP SHORT TERM GOAL #3   Title Pt will increase divergent naming to 7+ items per concrete category with min cues.   Baseline 3 items per category   Time 4   Period Weeks   Status On-going     SLP SHORT TERM GOAL #4   Title Pt will increase reading comprehension of paragraph length material to 90% with mi/mod cues and use of compensatory  visual attention strategies.   Baseline 80% sentence length   Time 4   Period Weeks          SLP Long Term Goals - 08/03/16 1525      SLP LONG TERM GOAL #1   Title Increase cognitive linguistic skills to [facility] with use of strategies and min cues as needed.   Baseline mi/moderately impaired   Time 8   Period Weeks   Status On-going          Plan - 08/03/16 1501    Clinical Impression Statement Completed short form of the [facility] Naming Test with 73% accuracy. She completed category exclusion and inclusion tasks with no diffiuclty in 2/2 trials, indicating strong ability to read and process information at the one word level. She completed 1-step tasks with no difficulty, but she required minimal verbal cues for 2-step directions. She completed verbal mediation tasks with max verbal and visual cues. Pt. benefited from short therapeutic  rest breaks as she stated that she felt agitated durign session today. She recalled 0/3 words with 5 minute delay in 2/2 trials.    Speech Therapy Frequency 2x / week   Duration 4 weeks   Treatment/Interventions Functional tasks;Patient/family education;Cognitive reorganization;Compensatory techniques;SLP instruction and feedback   Potential to Achieve Goals Good   SLP Home Exercise Plan provided carryover writing tasks    Consulted and Agree with Plan of Care Patient      Patient will benefit from skilled therapeutic intervention in order to improve the following deficits and impairments:   Cognitive communication deficit    Problem List Patient Active Problem List   Diagnosis Date Noted  . Pelvic pain in female 11/19/2015  . Diarrhea 11/01/2015  . Arthritis of knee 06/24/2013  . HYPERKALEMIA 07/07/2009  . HYPERTENSION, UNSPECIFIED 07/07/2009  . BRADYCARDIA 07/07/2009  . DIASTOLIC HEART FAILURE, CHRONIC 07/07/2009  . DYSPNEA 07/07/2009   Thank you,   Danella Maiers. Orvan Falconer MS, CCC-SLP  Speech-Language Pathologist [phone]      [name] 08/03/2016, 3:29 PM  [facility] [address] [place], [place], [address] Phone: [phone]   Fax:  [phone]   Name: [name] MRN: [id number] Date of Birth: September 16, 1951

## 2016-08-08 ENCOUNTER — Encounter (HOSPITAL_COMMUNITY): Payer: Self-pay | Admitting: Speech Pathology

## 2016-08-08 ENCOUNTER — Ambulatory Visit (HOSPITAL_COMMUNITY): Payer: PPO | Admitting: Speech Pathology

## 2016-08-08 DIAGNOSIS — R41841 Cognitive communication deficit: Secondary | ICD-10-CM | POA: Diagnosis not present

## 2016-08-08 NOTE — Therapy (Signed)
[place], [place], [address] Phone: [phone]   Fax:  [phone]  Speech Language Pathology Treatment  Patient Details  Name: [name] MRN: [id number] Date of Birth: 02-18-1951 Referring Provider: Sinda Du  Encounter Date: 08/08/2016      End of Session - 08/08/16 1853    Visit Number 4   Number of Visits 8   Date for SLP Re-Evaluation 08/24/16   Authorization Type [employer]    SLP Start Time 1500   SLP Stop Time  1542   SLP Time Calculation (min) 42 min   Activity Tolerance Patient tolerated treatment well      Past Medical History:  Diagnosis Date  . Anxiety   . Arthritis   . Depression   . Hypertension   . Panic attacks     Past Surgical History:  Procedure Laterality Date  . BIOPSY N/A 12/02/2015   Procedure: BIOPSY;  Surgeon: Rogene Houston, MD;  [facility];  Service: Endoscopy;  Laterality: N/A;  Random colon biopsies  . CHOLECYSTECTOMY    . COLONOSCOPY    . COLONOSCOPY N/A 12/02/2015   Procedure: COLONOSCOPY;  Surgeon: Rogene Houston, MD;  [facility];  Service: Endoscopy;  Laterality: N/A;  110  . EYE SURGERY     as a child  . GASTRIC BYPASS     in 1985 for obesity  . POLYPECTOMY N/A 12/02/2015   Procedure: POLYPECTOMY;  Surgeon: Rogene Houston, MD;  [facility];  Service: Endoscopy;  Laterality: N/A;  Splenic flexure polyp  . right foot surgery      There were no vitals filed for this visit.      Subjective Assessment - 08/08/16 1849    Subjective "I hope you can figure me out."   Currently in Pain? No/denies               ADULT SLP TREATMENT - 08/08/16 1850      General Information   Behavior/Cognition Alert;Cooperative;Pleasant mood   Patient Positioning Upright in chair   Oral care provided N/A   HPI [name] is a 65 y/o right handed female with PMHx significant for hypertension, depression,  anxiety, alcohol abuse, pernicious anemia, and chronic back pain who was transferred from [facility] ED on 05/26/16 after she was found down with melena. 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 She was also noted with low hemoglobin of 5 requiring transfusions and rhabdomyolysis. She was transferred to [facility] for further management. Upon admission she was febrile with lactic acidosis and CK of 1400. Workup revealed Klebsiella UTI for which she completed a 7 day course of Rocephin. For stroke workup she underwent MRI brain revealing a left posterior MCA and watershed territory infarct with additional multifocal infarcts involving the left frontal lobe, bilateral basal ganglia and right temporal lobe consistent with a proximal embolic source. TTE did not showed any embolic source or interatrial shunt. Neurology was consulted for her stroke and recommended Aspirin and statin for secondary stroke prevention. GI was consulted to evaluate for GI bleed. EGD was done on 8/16 which was normal. GI bleed was felt to be secondary to medications including Goodies and Meloxicam for back pain. Hospital course was complicated with visual hallucinations and anxiety. As a result of her stroke, the patient suffered from right sided weakness, aphasia, and visual deficits impairing her mobility, ADLs, swallowing and cognition. The patient was admitted to inpatient  2x / week   Duration 4 weeks   Treatment/Interventions Functional tasks;Patient/family education;Cognitive reorganization;Compensatory techniques;SLP instruction and feedback   Potential to Achieve Goals Good   SLP Home Exercise Plan provided carryover writing tasks    Consulted and Agree with Plan of Care Patient      Patient will benefit from skilled therapeutic intervention in order to improve the following deficits and impairments:   Cognitive communication deficit    Problem List Patient Active Problem List   Diagnosis Date Noted  . Pelvic pain in female 11/19/2015  . Diarrhea 11/01/2015  . Arthritis of knee 06/24/2013  . HYPERKALEMIA 07/07/2009  . HYPERTENSION, UNSPECIFIED 07/07/2009  . BRADYCARDIA 07/07/2009  . DIASTOLIC HEART FAILURE, CHRONIC 07/07/2009  . DYSPNEA 07/07/2009   Thank you,  Genene Churn, CCC-SLP [phone]  [place] 08/08/2016, 7:01 PM  [place] [address] [place], [place], [address] Phone: [phone]   Fax:  [phone]   Name: [name] MRN: [id number] Date of Birth: 1951-01-08  2x / week   Duration 4 weeks   Treatment/Interventions Functional tasks;Patient/family education;Cognitive reorganization;Compensatory techniques;SLP instruction and feedback   Potential to Achieve Goals Good   SLP Home Exercise Plan provided carryover writing tasks    Consulted and Agree with Plan of Care Patient      Patient will benefit from skilled therapeutic intervention in order to improve the following deficits and impairments:   Cognitive communication deficit    Problem List Patient Active Problem List   Diagnosis Date Noted  . Pelvic pain in female 11/19/2015  . Diarrhea 11/01/2015  . Arthritis of knee 06/24/2013  . HYPERKALEMIA 07/07/2009  . HYPERTENSION, UNSPECIFIED 07/07/2009  . BRADYCARDIA 07/07/2009  . DIASTOLIC HEART FAILURE, CHRONIC 07/07/2009  . DYSPNEA 07/07/2009   Thank you,  Genene Churn, CCC-SLP [phone]  [place] 08/08/2016, 7:01 PM  [place] [address] [place], [place], [address] Phone: [phone]   Fax:  [phone]   Name: [name] MRN: [id number] Date of Birth: 1951-01-08

## 2016-08-10 ENCOUNTER — Ambulatory Visit (HOSPITAL_COMMUNITY): Payer: PPO | Admitting: Speech Pathology

## 2016-08-10 ENCOUNTER — Encounter (HOSPITAL_COMMUNITY): Payer: Self-pay | Admitting: Speech Pathology

## 2016-08-10 DIAGNOSIS — R41841 Cognitive communication deficit: Secondary | ICD-10-CM | POA: Diagnosis not present

## 2016-08-10 NOTE — Therapy (Signed)
[place], [place], [address] Phone: [phone]   Fax:  [phone]  Speech Language Pathology Treatment  Patient Details  Name: [name] MRN: [id number] Date of Birth: 1951-05-23 Referring Provider: Sinda Du  Encounter Date: 08/10/2016      End of Session - 08/10/16 1731    Visit Number 5   Number of Visits 8   Date for SLP Re-Evaluation 08/24/16   Authorization Type [employer]    Activity Tolerance Patient tolerated treatment well      Past Medical History:  Diagnosis Date  . Anxiety   . Arthritis   . Depression   . Hypertension   . Panic attacks     Past Surgical History:  Procedure Laterality Date  . BIOPSY N/A 12/02/2015   Procedure: BIOPSY;  Surgeon: Rogene Houston, MD;  [facility];  Service: Endoscopy;  Laterality: N/A;  Random colon biopsies  . CHOLECYSTECTOMY    . COLONOSCOPY    . COLONOSCOPY N/A 12/02/2015   Procedure: COLONOSCOPY;  Surgeon: Rogene Houston, MD;  [facility];  Service: Endoscopy;  Laterality: N/A;  110  . EYE SURGERY     as a child  . GASTRIC BYPASS     in 1985 for obesity  . POLYPECTOMY N/A 12/02/2015   Procedure: POLYPECTOMY;  Surgeon: Rogene Houston, MD;  [facility];  Service: Endoscopy;  Laterality: N/A;  Splenic flexure polyp  . right foot surgery      There were no vitals filed for this visit.      Subjective Assessment - 08/10/16 1729    Subjective "I feel like I can't  do anything"    Currently in Pain? No/denies               ADULT SLP TREATMENT - 08/10/16 1745      General Information   Behavior/Cognition Alert;Cooperative;Pleasant mood   Patient Positioning Upright in chair   Oral care provided N/A   HPI [name] is a 65 y/o right handed female with PMHx significant for hypertension, depression, anxiety, alcohol abuse, pernicious anemia, and chronic back pain who was transferred  from [facility] ED on 05/26/16 after she was found down with melena. 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 She was also noted with low hemoglobin of 5 requiring transfusions and rhabdomyolysis. She was transferred to [facility] for further management. Upon admission she was febrile with lactic acidosis and CK of 1400. Workup revealed Klebsiella UTI for which she completed a 7 day course of Rocephin. For stroke workup she underwent MRI brain revealing a left posterior MCA and watershed territory infarct with additional multifocal infarcts involving the left frontal lobe, bilateral basal ganglia and right temporal lobe consistent with a proximal embolic source. TTE did not showed any embolic source or interatrial shunt. Neurology was consulted for her stroke and recommended Aspirin and statin for secondary stroke prevention. GI was consulted to evaluate for GI bleed. EGD was done on 8/16 which was normal. GI bleed was felt to be secondary to medications including Goodies and Meloxicam for back pain. Hospital course was complicated with visual hallucinations and anxiety. As a result of her stroke, the patient suffered from right sided weakness, aphasia, and visual deficits impairing her mobility, ADLs, swallowing and cognition. The patient was admitted to inpatient rehabilitation for comprehensive therapy and continued medical management from 06/05/16 to 06/16/16. The patient stayed at [facility] 06/16/16  Consulted and Agree with Plan of Care Patient      Patient will benefit from skilled therapeutic intervention in order to improve the following deficits and impairments:   Cognitive communication deficit    Problem List Patient Active Problem List   Diagnosis Date Noted  . Pelvic pain in female 11/19/2015  . Diarrhea 11/01/2015  . Arthritis of knee 06/24/2013  . HYPERKALEMIA 07/07/2009  . HYPERTENSION, UNSPECIFIED 07/07/2009  . BRADYCARDIA 07/07/2009  . DIASTOLIC HEART FAILURE, CHRONIC 07/07/2009  . DYSPNEA 07/07/2009    Thank you,   [name]. [name] [place], CCC-SLP  Speech-Language Pathologist [phone]     [name] 08/10/2016, 5:52 PM  [place] [address] [place], [place], [address] Phone: [phone]   Fax:  [phone]   Name: [name] MRN: [id number] Date of Birth: [birth year]  Consulted and Agree with Plan of Care Patient      Patient will benefit from skilled therapeutic intervention in order to improve the following deficits and impairments:   Cognitive communication deficit    Problem List Patient Active Problem List   Diagnosis Date Noted  . Pelvic pain in female 11/19/2015  . Diarrhea 11/01/2015  . Arthritis of knee 06/24/2013  . HYPERKALEMIA 07/07/2009  . HYPERTENSION, UNSPECIFIED 07/07/2009  . BRADYCARDIA 07/07/2009  . DIASTOLIC HEART FAILURE, CHRONIC 07/07/2009  . DYSPNEA 07/07/2009    Thank you,   [name]. [name] [place], CCC-SLP  Speech-Language Pathologist [phone]     [name] 08/10/2016, 5:52 PM  [place] [address] [place], [place], [address] Phone: [phone]   Fax:  [phone]   Name: [name] MRN: [id number] Date of Birth: [birth year]

## 2016-08-15 ENCOUNTER — Ambulatory Visit (HOSPITAL_COMMUNITY): Payer: PPO | Admitting: Speech Pathology

## 2016-08-15 ENCOUNTER — Encounter (HOSPITAL_COMMUNITY): Payer: Self-pay | Admitting: Speech Pathology

## 2016-08-15 DIAGNOSIS — R41841 Cognitive communication deficit: Secondary | ICD-10-CM | POA: Diagnosis not present

## 2016-08-15 NOTE — Therapy (Signed)
[place], [place], [address] Phone: [phone]   Fax:  [phone]  Speech Language Pathology Treatment  Patient Details  Name: [name] MRN: [id number] Date of Birth: August 13, 1951 Referring Provider: Sinda Du  Encounter Date: 08/15/2016      End of Session - 08/15/16 1355    Visit Number 6   Number of Visits 8   Date for SLP Re-Evaluation 08/24/16   Authorization Type [employer]    SLP Start Time [id number]   SLP Stop Time  1430   SLP Time Calculation (min) 42 min   Activity Tolerance Patient tolerated treatment well      Past Medical History:  Diagnosis Date  . Anxiety   . Arthritis   . Depression   . Hypertension   . Panic attacks     Past Surgical History:  Procedure Laterality Date  . BIOPSY N/A 12/02/2015   Procedure: BIOPSY;  Surgeon: Rogene Houston, MD;  [facility];  Service: Endoscopy;  Laterality: N/A;  Random colon biopsies  . CHOLECYSTECTOMY    . COLONOSCOPY    . COLONOSCOPY N/A 12/02/2015   Procedure: COLONOSCOPY;  Surgeon: Rogene Houston, MD;  [facility];  Service: Endoscopy;  Laterality: N/A;  110  . EYE SURGERY     as a child  . GASTRIC BYPASS     in 1985 for obesity  . POLYPECTOMY N/A 12/02/2015   Procedure: POLYPECTOMY;  Surgeon: Rogene Houston, MD;  [facility];  Service: Endoscopy;  Laterality: N/A;  Splenic flexure polyp  . right foot surgery      There were no vitals filed for this visit.      Subjective Assessment - 08/15/16 1354    Subjective "I made the same mistake three times."   Currently in Pain? No/denies               ADULT SLP TREATMENT - 08/15/16 1355      General Information   Behavior/Cognition Alert;Cooperative;Pleasant mood   Patient Positioning Upright in chair   Oral care provided N/A   HPI [name] is a 65 y/o right handed female with PMHx significant for hypertension,  depression, anxiety, alcohol abuse, pernicious anemia, and chronic back pain who was transferred from [facility] ED on 05/26/16 after she was found down with melena. 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 She was also noted with low hemoglobin of 5 requiring transfusions and rhabdomyolysis. She was transferred to [facility] for further management. Upon admission she was febrile with lactic acidosis and CK of 1400. Workup revealed Klebsiella UTI for which she completed a 7 day course of Rocephin. For stroke workup she underwent MRI brain revealing a left posterior MCA and watershed territory infarct with additional multifocal infarcts involving the left frontal lobe, bilateral basal ganglia and right temporal lobe consistent with a proximal embolic source. TTE did not showed any embolic source or interatrial shunt. Neurology was consulted for her stroke and recommended Aspirin and statin for secondary stroke prevention. GI was consulted to evaluate for GI bleed. EGD was done on 8/16 which was normal. GI bleed was felt to be secondary to medications including Goodies and Meloxicam for back pain. Hospital course was complicated with visual hallucinations and anxiety. As a result of her stroke, the patient suffered from right sided weakness, aphasia, and visual deficits impairing her mobility, ADLs, swallowing and cognition. The patient was admitted to inpatient  dysgraphia therapy intervention and compensatory strategies for memory deficits.    Speech Therapy Frequency 2x / week   Duration 4 weeks   Treatment/Interventions Functional tasks;Patient/family education;Cognitive reorganization;SLP instruction and feedback   Potential to [place] provided carryover writing tasks    Consulted and Agree with Plan of Care Patient      Patient will benefit from skilled therapeutic intervention in order to improve the following deficits and impairments:   Cognitive communication deficit    Problem List Patient Active Problem List   Diagnosis Date Noted  . Pelvic pain in female 11/19/2015  . Diarrhea 11/01/2015  . Arthritis of knee 06/24/2013  . HYPERKALEMIA 07/07/2009  . HYPERTENSION, UNSPECIFIED 07/07/2009  . BRADYCARDIA 07/07/2009  . DIASTOLIC HEART FAILURE, CHRONIC 07/07/2009  . DYSPNEA 07/07/2009   Thank you,  Genene Churn, [place]  [place] 08/15/2016, 1:57 PM  [place] [address] [place], [place], [address] Phone: [phone]   Fax:  [phone]   Name: [name] MRN: [id number] Date of Birth: June 11, 1951  [place], [place], [address] Phone: [phone]   Fax:  [phone]  Speech Language Pathology Treatment  Patient Details  Name: [name] MRN: [id number] Date of Birth: August 13, 1951 Referring Provider: Sinda Du  Encounter Date: 08/15/2016      End of Session - 08/15/16 1355    Visit Number 6   Number of Visits 8   Date for SLP Re-Evaluation 08/24/16   Authorization Type [employer]    SLP Start Time [id number]   SLP Stop Time  1430   SLP Time Calculation (min) 42 min   Activity Tolerance Patient tolerated treatment well      Past Medical History:  Diagnosis Date  . Anxiety   . Arthritis   . Depression   . Hypertension   . Panic attacks     Past Surgical History:  Procedure Laterality Date  . BIOPSY N/A 12/02/2015   Procedure: BIOPSY;  Surgeon: Rogene Houston, MD;  [facility];  Service: Endoscopy;  Laterality: N/A;  Random colon biopsies  . CHOLECYSTECTOMY    . COLONOSCOPY    . COLONOSCOPY N/A 12/02/2015   Procedure: COLONOSCOPY;  Surgeon: Rogene Houston, MD;  [facility];  Service: Endoscopy;  Laterality: N/A;  110  . EYE SURGERY     as a child  . GASTRIC BYPASS     in 1985 for obesity  . POLYPECTOMY N/A 12/02/2015   Procedure: POLYPECTOMY;  Surgeon: Rogene Houston, MD;  [facility];  Service: Endoscopy;  Laterality: N/A;  Splenic flexure polyp  . right foot surgery      There were no vitals filed for this visit.      Subjective Assessment - 08/15/16 1354    Subjective "I made the same mistake three times."   Currently in Pain? No/denies               ADULT SLP TREATMENT - 08/15/16 1355      General Information   Behavior/Cognition Alert;Cooperative;Pleasant mood   Patient Positioning Upright in chair   Oral care provided N/A   HPI [name] is a 65 y/o right handed female with PMHx significant for hypertension,  depression, anxiety, alcohol abuse, pernicious anemia, and chronic back pain who was transferred from [facility] ED on 05/26/16 after she was found down with melena. 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 She was also noted with low hemoglobin of 5 requiring transfusions and rhabdomyolysis. She was transferred to [facility] for further management. Upon admission she was febrile with lactic acidosis and CK of 1400. Workup revealed Klebsiella UTI for which she completed a 7 day course of Rocephin. For stroke workup she underwent MRI brain revealing a left posterior MCA and watershed territory infarct with additional multifocal infarcts involving the left frontal lobe, bilateral basal ganglia and right temporal lobe consistent with a proximal embolic source. TTE did not showed any embolic source or interatrial shunt. Neurology was consulted for her stroke and recommended Aspirin and statin for secondary stroke prevention. GI was consulted to evaluate for GI bleed. EGD was done on 8/16 which was normal. GI bleed was felt to be secondary to medications including Goodies and Meloxicam for back pain. Hospital course was complicated with visual hallucinations and anxiety. As a result of her stroke, the patient suffered from right sided weakness, aphasia, and visual deficits impairing her mobility, ADLs, swallowing and cognition. The patient was admitted to inpatient

## 2016-08-16 DIAGNOSIS — M545 Low back pain: Secondary | ICD-10-CM | POA: Diagnosis not present

## 2016-08-16 DIAGNOSIS — Z8673 Personal history of transient ischemic attack (TIA), and cerebral infarction without residual deficits: Secondary | ICD-10-CM | POA: Diagnosis not present

## 2016-08-16 DIAGNOSIS — F419 Anxiety disorder, unspecified: Secondary | ICD-10-CM | POA: Diagnosis not present

## 2016-08-16 DIAGNOSIS — I10 Essential (primary) hypertension: Secondary | ICD-10-CM | POA: Diagnosis not present

## 2016-08-17 ENCOUNTER — Ambulatory Visit (HOSPITAL_COMMUNITY): Payer: PPO | Attending: Pulmonary Disease | Admitting: Speech Pathology

## 2016-08-17 DIAGNOSIS — R41841 Cognitive communication deficit: Secondary | ICD-10-CM | POA: Diagnosis not present

## 2016-08-17 NOTE — Therapy (Signed)
[place], [place], [address] Phone: [phone]   Fax:  [phone]  Speech Language Pathology Treatment  Patient Details  Name: [name] MRN: [id number] Date of Birth: December 23, 1950 Referring Provider: Sinda Du  Encounter Date: 08/17/2016      End of Session - 08/17/16 1418    Visit Number 7   Number of Visits 8   Date for SLP Re-Evaluation 08/24/16   Authorization Type [employer]    SLP Start Time [id number]   SLP Stop Time  1430   SLP Time Calculation (min) 42 min   Activity Tolerance Patient tolerated treatment well      Past Medical History:  Diagnosis Date  . Anxiety   . Arthritis   . Depression   . Hypertension   . Panic attacks     Past Surgical History:  Procedure Laterality Date  . BIOPSY N/A 12/02/2015   Procedure: BIOPSY;  Surgeon: Rogene Houston, MD;  [facility];  Service: Endoscopy;  Laterality: N/A;  Random colon biopsies  . CHOLECYSTECTOMY    . COLONOSCOPY    . COLONOSCOPY N/A 12/02/2015   Procedure: COLONOSCOPY;  Surgeon: Rogene Houston, MD;  [facility];  Service: Endoscopy;  Laterality: N/A;  110  . EYE SURGERY     as a child  . GASTRIC BYPASS     in 1985 for obesity  . POLYPECTOMY N/A 12/02/2015   Procedure: POLYPECTOMY;  Surgeon: Rogene Houston, MD;  [facility];  Service: Endoscopy;  Laterality: N/A;  Splenic flexure polyp  . right foot surgery      There were no vitals filed for this visit.      Subjective Assessment - 08/17/16 1352    Subjective "Neither one of [place] felt great yesterday."   Currently in Pain? No/denies               ADULT SLP TREATMENT - 08/17/16 1352      General Information   Behavior/Cognition Alert;Cooperative;Pleasant mood   Patient Positioning Upright in chair   Oral care provided N/A   HPI [name] is a 65 y/o right handed female with PMHx significant for hypertension,  depression, anxiety, alcohol abuse, pernicious anemia, and chronic back pain who was transferred from [facility] ED on 05/26/16 after she was found down with melena. 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 She was also noted with low hemoglobin of 5 requiring transfusions and rhabdomyolysis. She was transferred to [facility] for further management. Upon admission she was febrile with lactic acidosis and CK of 1400. Workup revealed Klebsiella UTI for which she completed a 7 day course of Rocephin. For stroke workup she underwent MRI brain revealing a left posterior MCA and watershed territory infarct with additional multifocal infarcts involving the left frontal lobe, bilateral basal ganglia and right temporal lobe consistent with a proximal embolic source. TTE did not showed any embolic source or interatrial shunt. Neurology was consulted for her stroke and recommended Aspirin and statin for secondary stroke prevention. GI was consulted to evaluate for GI bleed. EGD was done on 8/16 which was normal. GI bleed was felt to be secondary to medications including Goodies and Meloxicam for back pain. Hospital course was complicated with visual hallucinations and anxiety. As a result of her stroke, the patient suffered from right sided weakness, aphasia, and visual deficits impairing her mobility, ADLs, swallowing and cognition. The patient was admitted to inpatient  [place], [place], [address] Phone: [phone]   Fax:  [phone]  Speech Language Pathology Treatment  Patient Details  Name: [name] MRN: [id number] Date of Birth: December 23, 1950 Referring Provider: Sinda Du  Encounter Date: 08/17/2016      End of Session - 08/17/16 1418    Visit Number 7   Number of Visits 8   Date for SLP Re-Evaluation 08/24/16   Authorization Type [employer]    SLP Start Time [id number]   SLP Stop Time  1430   SLP Time Calculation (min) 42 min   Activity Tolerance Patient tolerated treatment well      Past Medical History:  Diagnosis Date  . Anxiety   . Arthritis   . Depression   . Hypertension   . Panic attacks     Past Surgical History:  Procedure Laterality Date  . BIOPSY N/A 12/02/2015   Procedure: BIOPSY;  Surgeon: Rogene Houston, MD;  [facility];  Service: Endoscopy;  Laterality: N/A;  Random colon biopsies  . CHOLECYSTECTOMY    . COLONOSCOPY    . COLONOSCOPY N/A 12/02/2015   Procedure: COLONOSCOPY;  Surgeon: Rogene Houston, MD;  [facility];  Service: Endoscopy;  Laterality: N/A;  110  . EYE SURGERY     as a child  . GASTRIC BYPASS     in 1985 for obesity  . POLYPECTOMY N/A 12/02/2015   Procedure: POLYPECTOMY;  Surgeon: Rogene Houston, MD;  [facility];  Service: Endoscopy;  Laterality: N/A;  Splenic flexure polyp  . right foot surgery      There were no vitals filed for this visit.      Subjective Assessment - 08/17/16 1352    Subjective "Neither one of [place] felt great yesterday."   Currently in Pain? No/denies               ADULT SLP TREATMENT - 08/17/16 1352      General Information   Behavior/Cognition Alert;Cooperative;Pleasant mood   Patient Positioning Upright in chair   Oral care provided N/A   HPI [name] is a 65 y/o right handed female with PMHx significant for hypertension,  depression, anxiety, alcohol abuse, pernicious anemia, and chronic back pain who was transferred from [facility] ED on 05/26/16 after she was found down with melena. 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 She was also noted with low hemoglobin of 5 requiring transfusions and rhabdomyolysis. She was transferred to [facility] for further management. Upon admission she was febrile with lactic acidosis and CK of 1400. Workup revealed Klebsiella UTI for which she completed a 7 day course of Rocephin. For stroke workup she underwent MRI brain revealing a left posterior MCA and watershed territory infarct with additional multifocal infarcts involving the left frontal lobe, bilateral basal ganglia and right temporal lobe consistent with a proximal embolic source. TTE did not showed any embolic source or interatrial shunt. Neurology was consulted for her stroke and recommended Aspirin and statin for secondary stroke prevention. GI was consulted to evaluate for GI bleed. EGD was done on 8/16 which was normal. GI bleed was felt to be secondary to medications including Goodies and Meloxicam for back pain. Hospital course was complicated with visual hallucinations and anxiety. As a result of her stroke, the patient suffered from right sided weakness, aphasia, and visual deficits impairing her mobility, ADLs, swallowing and cognition. The patient was admitted to inpatient  Treatment/Interventions Functional tasks;Patient/family education;Cognitive reorganization;SLP instruction and feedback   Potential to [place] provided carryover writing tasks    Consulted and Agree with Plan of Care Patient      Patient will benefit from skilled therapeutic intervention in order to improve the following deficits and impairments:   Cognitive communication deficit    Problem List Patient Active Problem List   Diagnosis Date Noted  . Pelvic pain in female 11/19/2015  . Diarrhea 11/01/2015  . Arthritis of knee 06/24/2013  . HYPERKALEMIA 07/07/2009  . HYPERTENSION, UNSPECIFIED 07/07/2009  . BRADYCARDIA 07/07/2009  . DIASTOLIC HEART FAILURE, CHRONIC 07/07/2009  . DYSPNEA 07/07/2009   Thank you,  Genene Churn, [place]  Genene Churn 08/17/2016, 2:25 PM  [facility] [address] [place], [place], [address] Phone: [phone]   Fax:  [phone]   Name: [name] MRN: [id number] Date of Birth: [birth year]

## 2016-08-22 ENCOUNTER — Ambulatory Visit (HOSPITAL_COMMUNITY): Payer: PPO | Admitting: Speech Pathology

## 2016-08-22 DIAGNOSIS — R41841 Cognitive communication deficit: Secondary | ICD-10-CM | POA: Diagnosis not present

## 2016-08-22 NOTE — Therapy (Signed)
[facility] [facility] [address] [place], [place], [address] Phone: [phone]   Fax:  [phone]  Speech Language Pathology Treatment  Patient Details  Name: [name] MRN: [id number] Date of Birth: 25-May-1951 Referring Provider: Kari Baars  Encounter Date: 08/22/2016      End of Session - 08/22/16 1652    Visit Number 8   Number of Visits 8   Date for SLP Re-Evaluation 08/24/16   Authorization Type [employer]    SLP Start Time 1348   SLP Stop Time  1433   SLP Time Calculation (min) 45 min   Activity Tolerance Patient tolerated treatment well      Past Medical History:  Diagnosis Date  . Anxiety   . Arthritis   . Depression   . Hypertension   . Panic attacks     Past Surgical History:  Procedure Laterality Date  . BIOPSY N/A 12/02/2015   Procedure: BIOPSY;  Surgeon: Malissa Hippo, MD;  [facility];  Service: Endoscopy;  Laterality: N/A;  Random colon biopsies  . CHOLECYSTECTOMY    . COLONOSCOPY    . COLONOSCOPY N/A 12/02/2015   Procedure: COLONOSCOPY;  Surgeon: Malissa Hippo, MD;  [facility];  Service: Endoscopy;  Laterality: N/A;  110  . EYE SURGERY     as a child  . GASTRIC BYPASS     in 1985 for obesity  . POLYPECTOMY N/A 12/02/2015   Procedure: POLYPECTOMY;  Surgeon: Malissa Hippo, MD;  [facility];  Service: Endoscopy;  Laterality: N/A;  Splenic flexure polyp  . right foot surgery      There were no vitals filed for this visit.      Subjective Assessment - 08/22/16 1649    Subjective "I'm feeling alright today, I went back to [place] for the first time on Sunday"    Currently in Pain? No/denies   Multiple Pain Sites No               ADULT SLP TREATMENT - 08/22/16 0001      General Information   Behavior/Cognition Alert;Cooperative   Patient Positioning Upright in chair   Oral care provided N/A   HPI [name] is a 65 y/o right handed  female with PMHx significant for hypertension, depression, anxiety, alcohol abuse, pernicious anemia, and chronic back pain who was transferred from [facility] ED on 05/26/16 after she was found down with melena. 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 She was also noted with low hemoglobin of 5 requiring transfusions and rhabdomyolysis. She was transferred to [facility] for further management. Upon admission she was febrile with lactic acidosis and CK of 1400. Workup revealed Klebsiella UTI for which she completed a 7 day course of Rocephin. For stroke workup she underwent MRI brain revealing a left posterior MCA and watershed territory infarct with additional multifocal infarcts involving the left frontal lobe, bilateral basal ganglia and right temporal lobe consistent with a proximal embolic source. TTE did not showed any embolic source or interatrial shunt. Neurology was consulted for her stroke and recommended Aspirin and statin for secondary stroke prevention. GI was consulted to evaluate for GI bleed. EGD was done on 8/16 which was normal. GI bleed was felt to be secondary to medications including Goodies and Meloxicam for back pain. Hospital course was complicated with visual hallucinations and anxiety. As a result of her stroke, the patient suffered from right sided weakness, aphasia, and visual  deficits impairing her mobility, ADLs, swallowing and cognition. The patient was admitted to inpatient rehabilitation for comprehensive therapy and continued medical management from 06/05/16 to 06/16/16. The patient stayed at [facility] 06/16/16 to 06/30/2016. She was referred by her PCP, Dr. Juanetta Gosling for ongoing speech/language therapy as pt still presenting with cognitive communication deficits.      Treatment Provided   Treatment provided Cognitive-Linquistic     Pain Assessment   Pain Assessment No/denies pain     Cognitive-Linquistic Treatment   Treatment focused on  Cognition;Aphasia;Patient/family/caregiver education   Skilled Treatment verbal mediation, compensatory strategis for recall , feedback  regarding acuracy of homework completion      Assessment / Recommendations / Plan   Plan Continue with current plan of care          SLP Education - 08/22/16 1650    Education Details educated pt. about strategies and techniques for recall of information    Person(s) Educated Patient   Methods Explanation;Demonstration   Comprehension Verbalized understanding          SLP Short Term Goals - 08/22/16 1700      SLP SHORT TERM GOAL #1   Title Pt will increase recall of functional information to 90% acc with use of compensatory strategies as needed.   Baseline 0% without cues, 80% with cues   Time 4   Period Weeks   Status On-going     SLP SHORT TERM GOAL #2   Title Pt will increase recall of daily appointments, personal recent medical history, and general orientation to [facility] with use of written compensatory strategies as needed.   Baseline Pt not using a personal planning system at this time; Moderate impairment   Time 4   Period Weeks   Status On-going     SLP SHORT TERM GOAL #3   Title Pt will increase divergent naming to 7+ items per concrete category with min cues.   Baseline 3 items per category   Time 4   Period Weeks   Status On-going     SLP SHORT TERM GOAL #4   Title Pt will increase reading comprehension of paragraph length material to 90% with mi/mod cues and use of compensatory visual attention strategies.   Baseline 80% sentence length   Time 4   Period Weeks          SLP Long Term Goals - 08/22/16 1700      SLP LONG TERM GOAL #1   Title Increase cognitive linguistic skills to [facility] with use of strategies and min cues as needed.   Baseline mi/moderately impaired   Time 8   Period Weeks   Status On-going          Plan - 08/22/16 1653    Clinical Impression Statement Pt. recalled 1/3 words independently, and  she required mod verbal and visual cues for the remaining 2/3 words given 10, 20, and 30 minute delay. She recalled information from conversational exchanges throughout session today with mod verbal cues. She demonstrated error awareness of misspelled words with 20% accuracy independently and she required max cues in the remainder of trials. She identified letters (sound-symbol relationship) with 50% accuracy (5/10 trials) and with mod cues for the remaining 5/10. She completed divergent naming with 60% accuracy (6/10 trials) independently, and she required mod cues for the remaining 4/10 trials.  She completed homework task targeting word  identification from an already copied  sentence with 100% accuracy and  she did not complete worksheet for  spelling error corection because she said it was too dificult. Provided her with naming activity as well as error awareness for homework.    Speech Therapy Frequency 2x / week   Duration 4 weeks   Treatment/Interventions Functional tasks;Patient/family education;Cognitive reorganization;SLP instruction and feedback   Potential to Achieve Goals Good   SLP Home Exercise Plan provided carryover writing tasks    Consulted and Agree with Plan of Care Patient      Patient will benefit from skilled therapeutic intervention in order to improve the following deficits and impairments:   Cognitive communication deficit    Problem List Patient Active Problem List   Diagnosis Date Noted  . Pelvic pain in female 11/19/2015  . Diarrhea 11/01/2015  . Arthritis of knee 06/24/2013  . HYPERKALEMIA 07/07/2009  . HYPERTENSION, UNSPECIFIED 07/07/2009  . BRADYCARDIA 07/07/2009  . DIASTOLIC HEART FAILURE, CHRONIC 07/07/2009  . DYSPNEA 07/07/2009      Thank you,   Danella Maiers. Orvan Falconer MS, CCC-SLP  Speech-Language Pathologist [phone]     [name] 08/22/2016, 5:18 PM  [facility] [address]  [place], [place], [address] Phone: [phone]   Fax:  [phone]   Name: [name] MRN: [id number] Date of Birth: 06/16/1951

## 2016-08-24 ENCOUNTER — Encounter (HOSPITAL_COMMUNITY): Payer: Self-pay | Admitting: Speech Pathology

## 2016-08-24 ENCOUNTER — Ambulatory Visit (HOSPITAL_COMMUNITY): Payer: PPO | Admitting: Speech Pathology

## 2016-08-24 DIAGNOSIS — R41841 Cognitive communication deficit: Secondary | ICD-10-CM | POA: Diagnosis not present

## 2016-08-24 NOTE — Therapy (Signed)
summarized this paragraph with 25% accuracy. She then listened to 3-4 facts (read aloud), and answered with 80% accuracy. Pt. states that she will not receive new prescription for glasses. Pt. demonstrates progress with recall and naming goals, however she continues to present with deficits. ST interventions continue to be recommended (2x/wk x4 wk) to target deficits.    Speech Therapy Frequency 2x / week   Duration 4 weeks   Treatment/Interventions Functional tasks;Patient/family education;Cognitive reorganization;SLP instruction and feedback   Potential to [name] provided carryover writing tasks    Consulted and Agree with Plan of Care Patient      Patient will benefit from skilled therapeutic intervention in order to improve the following deficits and impairments:   Cognitive communication deficit    Problem List Patient Active Problem List   Diagnosis Date Noted  . Pelvic pain in female 11/19/2015  . Diarrhea 11/01/2015  . Arthritis of knee 06/24/2013  . HYPERKALEMIA 07/07/2009  . HYPERTENSION, UNSPECIFIED 07/07/2009  . BRADYCARDIA 07/07/2009  . DIASTOLIC HEART FAILURE, CHRONIC 07/07/2009  . DYSPNEA 07/07/2009   Speech Therapy Progress Note  Dates of Reporting Period 07/24/2016  to 08/24/2016  Objective Reports of Subjective Statement: See above    Objective Measurements: see above  Goal Update: added one new goal above, continue  remaining goals   Plan: Continue with ST to target cognitive deficits   Reason Skilled Services are Required: Pt. Continues to demonstrate deficits with cognition. She has made gains with goals, but  would benefit from continued ST intervention in order to complete functional tasks with increased independence and accuracy.   Thank you,   Molly Gails. [name], CCC-SLP  Speech-Language Pathologist [phone]       [name] 08/24/2016, 6:11 PM  [facility] [address] [place], [place], [address] Phone: [phone]   Fax:  [phone]   Name: [name] MRN: [id number] Date of Birth: 21-Sep-1951  [name], [place], [address] Phone: [phone]   Fax:  [phone]  Speech Language Pathology Treatment  Patient Details  Name: [name] MRN: [id number] Date of Birth: [birth year] Referring Provider: Sinda Du  Encounter Date: 08/24/2016      End of Session - 08/24/16 1456    Visit Number 9   Number of Visits 16   Date for SLP Re-Evaluation 09/21/16   Authorization Type [employer]    SLP Start Time 9357   SLP Stop Time  1446   SLP Time Calculation (min) 60 min   Activity Tolerance Patient tolerated treatment well      Past Medical History:  Diagnosis Date  . Anxiety   . Arthritis   . Depression   . Hypertension   . Panic attacks     Past Surgical History:  Procedure Laterality Date  . BIOPSY N/A 12/02/2015   Procedure: BIOPSY;  Surgeon: Rogene Houston, MD;  [facility];  Service: Endoscopy;  Laterality: N/A;  Random colon biopsies  . CHOLECYSTECTOMY    . COLONOSCOPY    . COLONOSCOPY N/A 12/02/2015   Procedure: COLONOSCOPY;  Surgeon: Rogene Houston, MD;  [facility];  Service: Endoscopy;  Laterality: N/A;  110  . EYE SURGERY     as a child  . GASTRIC BYPASS     in 1985 for obesity  . POLYPECTOMY N/A 12/02/2015   Procedure: POLYPECTOMY;  Surgeon: Rogene Houston, MD;  [facility];  Service: Endoscopy;  Laterality: N/A;  Splenic flexure polyp  . right foot surgery      There were no vitals filed for this visit.      Subjective Assessment - 08/24/16 1452    Subjective "I can't get the words in my mind out of my mouth to save my life"    Currently in Pain? No/denies   Multiple Pain Sites No               ADULT SLP TREATMENT - 08/24/16 0001      General Information   Behavior/Cognition Cooperative;Alert   Patient Positioning Upright in bed   HPI [name] is a 65 y/o right handed female with PMHx significant for  hypertension, depression, anxiety, alcohol abuse, pernicious anemia, and chronic back pain who was transferred from [facility] ED on 05/26/16 after she was found down with melena. 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 She was also noted with low hemoglobin of 5 requiring transfusions and rhabdomyolysis. She was transferred to [facility] for further management. Upon admission she was febrile with lactic acidosis and CK of 1400. Workup revealed Klebsiella UTI for which she completed a 7 day course of Rocephin. For stroke workup she underwent MRI brain revealing a left posterior MCA and watershed territory infarct with additional multifocal infarcts involving the left frontal lobe, bilateral basal ganglia and right temporal lobe consistent with a proximal embolic source. TTE did not showed any embolic source or interatrial shunt. Neurology was consulted for her stroke and recommended Aspirin and statin for secondary stroke prevention. GI was consulted to evaluate for GI bleed. EGD was done on 8/16 which was normal. GI bleed was felt to be secondary to medications including Goodies and Meloxicam for back pain. Hospital course was complicated with visual hallucinations and anxiety. As a result of her stroke, the patient suffered from right sided weakness, aphasia, and visual deficits impairing her mobility, ADLs,  summarized this paragraph with 25% accuracy. She then listened to 3-4 facts (read aloud), and answered with 80% accuracy. Pt. states that she will not receive new prescription for glasses. Pt. demonstrates progress with recall and naming goals, however she continues to present with deficits. ST interventions continue to be recommended (2x/wk x4 wk) to target deficits.    Speech Therapy Frequency 2x / week   Duration 4 weeks   Treatment/Interventions Functional tasks;Patient/family education;Cognitive reorganization;SLP instruction and feedback   Potential to [name] provided carryover writing tasks    Consulted and Agree with Plan of Care Patient      Patient will benefit from skilled therapeutic intervention in order to improve the following deficits and impairments:   Cognitive communication deficit    Problem List Patient Active Problem List   Diagnosis Date Noted  . Pelvic pain in female 11/19/2015  . Diarrhea 11/01/2015  . Arthritis of knee 06/24/2013  . HYPERKALEMIA 07/07/2009  . HYPERTENSION, UNSPECIFIED 07/07/2009  . BRADYCARDIA 07/07/2009  . DIASTOLIC HEART FAILURE, CHRONIC 07/07/2009  . DYSPNEA 07/07/2009   Speech Therapy Progress Note  Dates of Reporting Period 07/24/2016  to 08/24/2016  Objective Reports of Subjective Statement: See above    Objective Measurements: see above  Goal Update: added one new goal above, continue  remaining goals   Plan: Continue with ST to target cognitive deficits   Reason Skilled Services are Required: Pt. Continues to demonstrate deficits with cognition. She has made gains with goals, but  would benefit from continued ST intervention in order to complete functional tasks with increased independence and accuracy.   Thank you,   Molly Gails. [name], CCC-SLP  Speech-Language Pathologist [phone]       [name] 08/24/2016, 6:11 PM  [facility] [address] [place], [place], [address] Phone: [phone]   Fax:  [phone]   Name: [name] MRN: [id number] Date of Birth: 21-Sep-1951

## 2016-08-27 DIAGNOSIS — S299XXA Unspecified injury of thorax, initial encounter: Secondary | ICD-10-CM | POA: Diagnosis not present

## 2016-08-27 DIAGNOSIS — S39012A Strain of muscle, fascia and tendon of lower back, initial encounter: Secondary | ICD-10-CM | POA: Diagnosis not present

## 2016-08-27 DIAGNOSIS — W06XXXA Fall from bed, initial encounter: Secondary | ICD-10-CM | POA: Diagnosis not present

## 2016-08-27 DIAGNOSIS — S0181XA Laceration without foreign body of other part of head, initial encounter: Secondary | ICD-10-CM | POA: Diagnosis not present

## 2016-08-27 DIAGNOSIS — M545 Low back pain: Secondary | ICD-10-CM | POA: Diagnosis not present

## 2016-08-27 DIAGNOSIS — S3992XA Unspecified injury of lower back, initial encounter: Secondary | ICD-10-CM | POA: Diagnosis not present

## 2016-08-29 ENCOUNTER — Ambulatory Visit (HOSPITAL_COMMUNITY): Payer: PPO

## 2016-08-29 DIAGNOSIS — R41841 Cognitive communication deficit: Secondary | ICD-10-CM | POA: Diagnosis not present

## 2016-08-29 NOTE — Therapy (Addendum)
[place], [place], [address] Phone: [phone]   Fax:  [phone]  Speech Language Pathology Treatment  Patient Details  Name: [name] MRN: [id number] Date of Birth: [birth year] Referring Provider: Sinda Du  Encounter Date: 08/29/2016      End of Session - 08/29/16 1615    Visit Number 10   Number of Visits 16   Date for SLP Re-Evaluation 09/21/16   Authorization Type [employer]    SLP Start Time 6433   SLP Stop Time  1520   SLP Time Calculation (min) 45 min   Activity Tolerance Patient tolerated treatment well      Past Medical History:  Diagnosis Date  . Anxiety   . Arthritis   . Depression   . Hypertension   . Panic attacks     Past Surgical History:  Procedure Laterality Date  . BIOPSY N/A 12/02/2015   Procedure: BIOPSY;  Surgeon: Rogene Houston, MD;  [facility];  Service: Endoscopy;  Laterality: N/A;  Random colon biopsies  . CHOLECYSTECTOMY    . COLONOSCOPY    . COLONOSCOPY N/A 12/02/2015   Procedure: COLONOSCOPY;  Surgeon: Rogene Houston, MD;  [facility];  Service: Endoscopy;  Laterality: N/A;  110  . EYE SURGERY     as a child  . GASTRIC BYPASS     in 1985 for obesity  . POLYPECTOMY N/A 12/02/2015   Procedure: POLYPECTOMY;  Surgeon: Rogene Houston, MD;  [facility];  Service: Endoscopy;  Laterality: N/A;  Splenic flexure polyp  . right foot surgery      There were no vitals filed for this visit.      Subjective Assessment - 08/29/16 1002    Subjective "I'm having trouble using my phohne"    Currently in Pain? No/denies   Multiple Pain Sites No               ADULT SLP TREATMENT - 08/29/16 0001      General Information   Behavior/Cognition Alert;Cooperative   Patient Positioning Upright in chair   HPI [name] is a 65 y/o right handed female with PMHx significant for hypertension, depression, anxiety,  alcohol abuse, pernicious anemia, and chronic back pain who was transferred from [facility] ED on 05/26/16 after she was found down with melena. 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 She was also noted with low hemoglobin of 5 requiring transfusions and rhabdomyolysis. She was transferred to [facility] for further management. Upon admission she was febrile with lactic acidosis and CK of 1400. Workup revealed Klebsiella UTI for which she completed a 7 day course of Rocephin. For stroke workup she underwent MRI brain revealing a left posterior MCA and watershed territory infarct with additional multifocal infarcts involving the left frontal lobe, bilateral basal ganglia and right temporal lobe consistent with a proximal embolic source. TTE did not showed any embolic source or interatrial shunt. Neurology was consulted for her stroke and recommended Aspirin and statin for secondary stroke prevention. GI was consulted to evaluate for GI bleed. EGD was done on 8/16 which was normal. GI bleed was felt to be secondary to medications including Goodies and Meloxicam for back pain. Hospital course was complicated with visual hallucinations and anxiety. As a result of her stroke, the patient suffered from right sided weakness, aphasia, and visual deficits impairing her mobility, ADLs, swallowing and cognition. The patient was admitted to inpatient rehabilitation  to [place] provided carryover writing tasks    Consulted and Agree with Plan of Care Patient      Patient will benefit from skilled therapeutic intervention in order to improve the following deficits and impairments:   Cognitive communication deficit    Problem List Patient Active Problem List   Diagnosis Date Noted  . Pelvic pain in female 11/19/2015  . Diarrhea 11/01/2015  . Arthritis of knee 06/24/2013  . HYPERKALEMIA 07/07/2009  . HYPERTENSION, UNSPECIFIED 07/07/2009  . BRADYCARDIA 07/07/2009  . DIASTOLIC HEART FAILURE, CHRONIC 07/07/2009  . DYSPNEA 07/07/2009   Thank you,   [name]. [name] [place], CCC-SLP  Speech-Language Pathologist [phone]      [name] 08/29/2016, 3:57 PM  [place] [address] [place], [place], [address] Phone: [phone]   Fax:  [phone]   Name: [name] MRN: [id number] Date of Birth: April 12, 1951  to [place] provided carryover writing tasks    Consulted and Agree with Plan of Care Patient      Patient will benefit from skilled therapeutic intervention in order to improve the following deficits and impairments:   Cognitive communication deficit    Problem List Patient Active Problem List   Diagnosis Date Noted  . Pelvic pain in female 11/19/2015  . Diarrhea 11/01/2015  . Arthritis of knee 06/24/2013  . HYPERKALEMIA 07/07/2009  . HYPERTENSION, UNSPECIFIED 07/07/2009  . BRADYCARDIA 07/07/2009  . DIASTOLIC HEART FAILURE, CHRONIC 07/07/2009  . DYSPNEA 07/07/2009   Thank you,   [name]. [name] [place], CCC-SLP  Speech-Language Pathologist [phone]      [name] 08/29/2016, 3:57 PM  [place] [address] [place], [place], [address] Phone: [phone]   Fax:  [phone]   Name: [name] MRN: [id number] Date of Birth: April 12, 1951

## 2016-08-30 ENCOUNTER — Encounter (HOSPITAL_COMMUNITY): Payer: Self-pay

## 2016-08-30 NOTE — Therapy (Deleted)
[place], [place], [address] Phone: [phone]   Fax:  [phone]  Speech Language Pathology Treatment  Patient Details  Name: [name] MRN: [id number] Date of Birth: 30-Jan-1951 Referring Provider: Sinda Du  Encounter Date: 08/29/2016      End of Session - 08/29/16 1615    Visit Number 10   Number of Visits 16   Date for SLP Re-Evaluation 09/21/16   Authorization Type [employer]    SLP Start Time 5329   SLP Stop Time  1520   SLP Time Calculation (min) 45 min   Activity Tolerance Patient tolerated treatment well      Past Medical History:  Diagnosis Date  . Anxiety   . Arthritis   . Depression   . Hypertension   . Panic attacks     Past Surgical History:  Procedure Laterality Date  . BIOPSY N/A 12/02/2015   Procedure: BIOPSY;  Surgeon: Rogene Houston, MD;  [facility];  Service: Endoscopy;  Laterality: N/A;  Random colon biopsies  . CHOLECYSTECTOMY    . COLONOSCOPY    . COLONOSCOPY N/A 12/02/2015   Procedure: COLONOSCOPY;  Surgeon: Rogene Houston, MD;  [facility];  Service: Endoscopy;  Laterality: N/A;  110  . EYE SURGERY     as a child  . GASTRIC BYPASS     in 1985 for obesity  . POLYPECTOMY N/A 12/02/2015   Procedure: POLYPECTOMY;  Surgeon: Rogene Houston, MD;  [facility];  Service: Endoscopy;  Laterality: N/A;  Splenic flexure polyp  . right foot surgery      There were no vitals filed for this visit.      Subjective Assessment - 08/29/16 1002    Subjective "I'm having trouble using my phohne"    Currently in Pain? No/denies   Multiple Pain Sites No               ADULT SLP TREATMENT - 08/29/16 0001      General Information   Behavior/Cognition Alert;Cooperative   Patient Positioning Upright in chair   HPI Havah Ammon is a 65 y/o right handed female with PMHx significant for hypertension, depression, anxiety,  alcohol abuse, pernicious anemia, and chronic back pain who was transferred from [facility] ED on 05/26/16 after she was found down with melena. 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 She was also noted with low hemoglobin of 5 requiring transfusions and rhabdomyolysis. She was transferred to [facility] for further management. Upon admission she was febrile with lactic acidosis and CK of 1400. Workup revealed Klebsiella UTI for which she completed a 7 day course of Rocephin. For stroke workup she underwent MRI brain revealing a left posterior MCA and watershed territory infarct with additional multifocal infarcts involving the left frontal lobe, bilateral basal ganglia and right temporal lobe consistent with a proximal embolic source. TTE did not showed any embolic source or interatrial shunt. Neurology was consulted for her stroke and recommended Aspirin and statin for secondary stroke prevention. GI was consulted to evaluate for GI bleed. EGD was done on 8/16 which was normal. GI bleed was felt to be secondary to medications including Goodies and Meloxicam for back pain. Hospital course was complicated with visual hallucinations and anxiety. As a result of her stroke, the patient suffered from right sided weakness, aphasia, and visual deficits impairing her mobility, ADLs, swallowing and cognition. The patient was admitted to inpatient rehabilitation  to [place] provided carryover writing tasks    Consulted and Agree with Plan of Care Patient      Patient will benefit from skilled therapeutic intervention in order to improve the following deficits and impairments:   Cognitive communication deficit    Problem List Patient Active Problem List   Diagnosis Date Noted  . Pelvic pain in female 11/19/2015  . Diarrhea 11/01/2015  . Arthritis of knee 06/24/2013  . HYPERKALEMIA 07/07/2009  . HYPERTENSION, UNSPECIFIED 07/07/2009  . BRADYCARDIA 07/07/2009  . DIASTOLIC HEART FAILURE, CHRONIC 07/07/2009  . DYSPNEA 07/07/2009   Thank you,   [name]. [name] [place], CCC-SLP  Speech-Language Pathologist [phone]      [name] 08/29/2016, 10:22 AM  [place], [place], [address] Phone: [phone]   Fax:  [phone]   Name: DARSI TIEN MRN: [id number] Date of Birth: 15-Jun-1951  to [place] provided carryover writing tasks    Consulted and Agree with Plan of Care Patient      Patient will benefit from skilled therapeutic intervention in order to improve the following deficits and impairments:   Cognitive communication deficit    Problem List Patient Active Problem List   Diagnosis Date Noted  . Pelvic pain in female 11/19/2015  . Diarrhea 11/01/2015  . Arthritis of knee 06/24/2013  . HYPERKALEMIA 07/07/2009  . HYPERTENSION, UNSPECIFIED 07/07/2009  . BRADYCARDIA 07/07/2009  . DIASTOLIC HEART FAILURE, CHRONIC 07/07/2009  . DYSPNEA 07/07/2009   Thank you,   [name]. [name] [place], CCC-SLP  Speech-Language Pathologist [phone]      [name] 08/29/2016, 10:22 AM  [place], [place], [address] Phone: [phone]   Fax:  [phone]   Name: DARSI TIEN MRN: [id number] Date of Birth: 15-Jun-1951

## 2016-08-31 ENCOUNTER — Ambulatory Visit (HOSPITAL_COMMUNITY): Payer: PPO | Admitting: Speech Pathology

## 2016-08-31 ENCOUNTER — Encounter (HOSPITAL_COMMUNITY): Payer: Self-pay | Admitting: Speech Pathology

## 2016-08-31 DIAGNOSIS — R41841 Cognitive communication deficit: Secondary | ICD-10-CM | POA: Diagnosis not present

## 2016-08-31 NOTE — Therapy (Signed)
[place], [place], [address] Phone: [phone]   Fax:  [phone]  Speech Language Pathology Treatment  Patient Details  Name: [name] MRN: [id number] Date of Birth: 1950-11-29 Referring Provider: Sinda Du  Encounter Date: 08/31/2016      End of Session - 08/31/16 2209    Visit Number 11   Number of Visits 16   Date for SLP Re-Evaluation 09/21/16   Authorization Type [employer]    SLP Start Time 2703   SLP Stop Time  5009   SLP Time Calculation (min) 49 min   Activity Tolerance Patient tolerated treatment well      Past Medical History:  Diagnosis Date  . Anxiety   . Arthritis   . Depression   . Hypertension   . Panic attacks     Past Surgical History:  Procedure Laterality Date  . BIOPSY N/A 12/02/2015   Procedure: BIOPSY;  Surgeon: Rogene Houston, MD;  [facility];  Service: Endoscopy;  Laterality: N/A;  Random colon biopsies  . CHOLECYSTECTOMY    . COLONOSCOPY    . COLONOSCOPY N/A 12/02/2015   Procedure: COLONOSCOPY;  Surgeon: Rogene Houston, MD;  [facility];  Service: Endoscopy;  Laterality: N/A;  110  . EYE SURGERY     as a child  . GASTRIC BYPASS     in 1985 for obesity  . POLYPECTOMY N/A 12/02/2015   Procedure: POLYPECTOMY;  Surgeon: Rogene Houston, MD;  [facility];  Service: Endoscopy;  Laterality: N/A;  Splenic flexure polyp  . right foot surgery      There were no vitals filed for this visit.      Subjective Assessment - 08/31/16 1532    Subjective "My back has been hurting after my fall."   Currently in Pain? Yes   Pain Score 7    Pain Location Back          ADULT SLP TREATMENT - 08/31/16 1535      General Information   Behavior/Cognition Alert;Cooperative   Patient Positioning Upright in chair   Oral care provided N/A   HPI [name] is a 65 y/o right handed female with PMHx significant for  hypertension, depression, anxiety, alcohol abuse, pernicious anemia, and chronic back pain who was transferred from [facility] ED on 05/26/16 after she was found down with melena. 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 She was also noted with low hemoglobin of 5 requiring transfusions and rhabdomyolysis. She was transferred to [facility] for further management. Upon admission she was febrile with lactic acidosis and CK of 1400. Workup revealed Klebsiella UTI for which she completed a 7 day course of Rocephin. For stroke workup she underwent MRI brain revealing a left posterior MCA and watershed territory infarct with additional multifocal infarcts involving the left frontal lobe, bilateral basal ganglia and right temporal lobe consistent with a proximal embolic source. TTE did not showed any embolic source or interatrial shunt. Neurology was consulted for her stroke and recommended Aspirin and statin for secondary stroke prevention. GI was consulted to evaluate for GI bleed. EGD was done on 8/16 which was normal. GI bleed was felt to be secondary to medications including Goodies and Meloxicam for back pain. Hospital course was complicated with visual hallucinations and anxiety. As a result of her stroke, the patient suffered from right sided weakness, aphasia, and visual deficits impairing her mobility, ADLs, swallowing and cognition.  placed small stickers on the center piece to assist. Her calendar was reviewed and she had increased difficulty finding current day, so SLP encouraged pt to cross off previous day every evening (SLP crossed off all dates prior to today). She answered basic orientation questions with mod verbal and visual cues from SLP. Pt continues to report difficulty with writing and requested additional homework targeting the same. She was provided with written automatic single word sentence completion task for homework. Continue POC.    Speech Therapy Frequency 2x / week   Duration 4 weeks   Treatment/Interventions Functional tasks;Patient/family education;Cognitive reorganization;SLP instruction and feedback   Potential to [place] provided carryover writing tasks    Consulted and Agree with Plan of Care Patient      Patient will benefit from skilled therapeutic intervention in order to improve the following deficits and impairments:   Cognitive communication deficit    Problem List Patient Active Problem List   Diagnosis Date Noted  . Pelvic pain in female 11/19/2015  . Diarrhea 11/01/2015  . Arthritis of knee 06/24/2013  . HYPERKALEMIA 07/07/2009  . HYPERTENSION, UNSPECIFIED 07/07/2009  . BRADYCARDIA 07/07/2009  . DIASTOLIC HEART FAILURE, CHRONIC 07/07/2009  . DYSPNEA 07/07/2009   Thank you,  Genene Churn,  [place]  [facility] 08/31/2016, 10:13 PM  [place] [address] [place], [place], [address] Phone: [phone]   Fax:  [phone]   Name: [name] MRN: [id number] Date of Birth: 1951-05-19  [place], [place], [address] Phone: [phone]   Fax:  [phone]  Speech Language Pathology Treatment  Patient Details  Name: [name] MRN: [id number] Date of Birth: 1950-11-29 Referring Provider: Sinda Du  Encounter Date: 08/31/2016      End of Session - 08/31/16 2209    Visit Number 11   Number of Visits 16   Date for SLP Re-Evaluation 09/21/16   Authorization Type [employer]    SLP Start Time 2703   SLP Stop Time  5009   SLP Time Calculation (min) 49 min   Activity Tolerance Patient tolerated treatment well      Past Medical History:  Diagnosis Date  . Anxiety   . Arthritis   . Depression   . Hypertension   . Panic attacks     Past Surgical History:  Procedure Laterality Date  . BIOPSY N/A 12/02/2015   Procedure: BIOPSY;  Surgeon: Rogene Houston, MD;  [facility];  Service: Endoscopy;  Laterality: N/A;  Random colon biopsies  . CHOLECYSTECTOMY    . COLONOSCOPY    . COLONOSCOPY N/A 12/02/2015   Procedure: COLONOSCOPY;  Surgeon: Rogene Houston, MD;  [facility];  Service: Endoscopy;  Laterality: N/A;  110  . EYE SURGERY     as a child  . GASTRIC BYPASS     in 1985 for obesity  . POLYPECTOMY N/A 12/02/2015   Procedure: POLYPECTOMY;  Surgeon: Rogene Houston, MD;  [facility];  Service: Endoscopy;  Laterality: N/A;  Splenic flexure polyp  . right foot surgery      There were no vitals filed for this visit.      Subjective Assessment - 08/31/16 1532    Subjective "My back has been hurting after my fall."   Currently in Pain? Yes   Pain Score 7    Pain Location Back          ADULT SLP TREATMENT - 08/31/16 1535      General Information   Behavior/Cognition Alert;Cooperative   Patient Positioning Upright in chair   Oral care provided N/A   HPI [name] is a 65 y/o right handed female with PMHx significant for  hypertension, depression, anxiety, alcohol abuse, pernicious anemia, and chronic back pain who was transferred from [facility] ED on 05/26/16 after she was found down with melena. 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 She was also noted with low hemoglobin of 5 requiring transfusions and rhabdomyolysis. She was transferred to [facility] for further management. Upon admission she was febrile with lactic acidosis and CK of 1400. Workup revealed Klebsiella UTI for which she completed a 7 day course of Rocephin. For stroke workup she underwent MRI brain revealing a left posterior MCA and watershed territory infarct with additional multifocal infarcts involving the left frontal lobe, bilateral basal ganglia and right temporal lobe consistent with a proximal embolic source. TTE did not showed any embolic source or interatrial shunt. Neurology was consulted for her stroke and recommended Aspirin and statin for secondary stroke prevention. GI was consulted to evaluate for GI bleed. EGD was done on 8/16 which was normal. GI bleed was felt to be secondary to medications including Goodies and Meloxicam for back pain. Hospital course was complicated with visual hallucinations and anxiety. As a result of her stroke, the patient suffered from right sided weakness, aphasia, and visual deficits impairing her mobility, ADLs, swallowing and cognition.

## 2016-09-04 ENCOUNTER — Telehealth (HOSPITAL_COMMUNITY): Payer: Self-pay

## 2016-09-04 ENCOUNTER — Ambulatory Visit (HOSPITAL_COMMUNITY): Payer: PPO

## 2016-09-04 ENCOUNTER — Encounter (HOSPITAL_COMMUNITY): Payer: Self-pay

## 2016-09-04 DIAGNOSIS — R4182 Altered mental status, unspecified: Secondary | ICD-10-CM | POA: Diagnosis not present

## 2016-09-04 DIAGNOSIS — R402411 Glasgow coma scale score 13-15, in the field [EMT or ambulance]: Secondary | ICD-10-CM | POA: Diagnosis not present

## 2016-09-04 DIAGNOSIS — F419 Anxiety disorder, unspecified: Secondary | ICD-10-CM | POA: Diagnosis not present

## 2016-09-04 DIAGNOSIS — R410 Disorientation, unspecified: Secondary | ICD-10-CM | POA: Diagnosis not present

## 2016-09-04 DIAGNOSIS — R0602 Shortness of breath: Secondary | ICD-10-CM | POA: Diagnosis not present

## 2016-09-04 DIAGNOSIS — E871 Hypo-osmolality and hyponatremia: Secondary | ICD-10-CM | POA: Diagnosis not present

## 2016-09-04 NOTE — Telephone Encounter (Signed)
Left Message - called patient regarding no show, left a message to call back in order to reschedule, also called home phone - busy.    Thank you,   [name]. [name], MS, CCC-SLP  Speech-Language Pathologist [phone]

## 2016-09-05 DIAGNOSIS — G9389 Other specified disorders of brain: Secondary | ICD-10-CM | POA: Diagnosis not present

## 2016-09-05 DIAGNOSIS — Z9181 History of falling: Secondary | ICD-10-CM | POA: Diagnosis not present

## 2016-09-05 DIAGNOSIS — Z8673 Personal history of transient ischemic attack (TIA), and cerebral infarction without residual deficits: Secondary | ICD-10-CM | POA: Diagnosis not present

## 2016-09-05 DIAGNOSIS — E871 Hypo-osmolality and hyponatremia: Secondary | ICD-10-CM | POA: Diagnosis not present

## 2016-09-05 DIAGNOSIS — R740 Nonspecific elevation of levels of transaminase and lactic acid dehydrogenase [LDH]: Secondary | ICD-10-CM | POA: Diagnosis not present

## 2016-09-05 DIAGNOSIS — R5381 Other malaise: Secondary | ICD-10-CM | POA: Diagnosis not present

## 2016-09-05 DIAGNOSIS — R0602 Shortness of breath: Secondary | ICD-10-CM | POA: Diagnosis not present

## 2016-09-05 DIAGNOSIS — R400 Somnolence: Secondary | ICD-10-CM | POA: Diagnosis not present

## 2016-09-05 DIAGNOSIS — I10 Essential (primary) hypertension: Secondary | ICD-10-CM | POA: Diagnosis not present

## 2016-09-05 DIAGNOSIS — I69398 Other sequelae of cerebral infarction: Secondary | ICD-10-CM | POA: Diagnosis not present

## 2016-09-05 DIAGNOSIS — G40409 Other generalized epilepsy and epileptic syndromes, not intractable, without status epilepticus: Secondary | ICD-10-CM | POA: Diagnosis not present

## 2016-09-05 DIAGNOSIS — F1010 Alcohol abuse, uncomplicated: Secondary | ICD-10-CM | POA: Diagnosis not present

## 2016-09-05 DIAGNOSIS — Z79899 Other long term (current) drug therapy: Secondary | ICD-10-CM | POA: Diagnosis not present

## 2016-09-05 DIAGNOSIS — R109 Unspecified abdominal pain: Secondary | ICD-10-CM | POA: Diagnosis not present

## 2016-09-05 DIAGNOSIS — F419 Anxiety disorder, unspecified: Secondary | ICD-10-CM | POA: Diagnosis not present

## 2016-09-05 DIAGNOSIS — N289 Disorder of kidney and ureter, unspecified: Secondary | ICD-10-CM | POA: Diagnosis not present

## 2016-09-05 DIAGNOSIS — R410 Disorientation, unspecified: Secondary | ICD-10-CM | POA: Diagnosis not present

## 2016-09-05 DIAGNOSIS — R4182 Altered mental status, unspecified: Secondary | ICD-10-CM | POA: Diagnosis not present

## 2016-09-06 ENCOUNTER — Encounter (HOSPITAL_COMMUNITY): Payer: Self-pay

## 2016-09-06 ENCOUNTER — Ambulatory Visit (HOSPITAL_COMMUNITY): Payer: PPO

## 2016-09-06 DIAGNOSIS — E871 Hypo-osmolality and hyponatremia: Secondary | ICD-10-CM | POA: Diagnosis not present

## 2016-09-06 DIAGNOSIS — F419 Anxiety disorder, unspecified: Secondary | ICD-10-CM | POA: Diagnosis not present

## 2016-09-06 DIAGNOSIS — R0602 Shortness of breath: Secondary | ICD-10-CM | POA: Diagnosis not present

## 2016-09-06 DIAGNOSIS — R400 Somnolence: Secondary | ICD-10-CM | POA: Diagnosis not present

## 2016-09-06 NOTE — Telephone Encounter (Signed)
Called pt. Regarding no show - husband states that she is in the hospital d/t aneurysm (admitted either Sunday or Monday night) ; he states that pt. will likely be discharged to SNF -explained that pt. will be discharged from outpatient ST    Thank you,   [name]. [name], MS, CCC-SLP  Speech-Language Pathologist [phone]

## 2016-09-06 NOTE — Therapy (Signed)
[place], [place], [address] Phone: [phone]   Fax:  [phone]  Patient Details  Name: [name] MRN: [id number] Date of Birth: October 30, 1950 Referring Provider:  No ref. provider found  Encounter Date: 09/06/2016  SPEECH THERAPY DISCHARGE SUMMARY  Visits from Start of Care: 11   Current functional level related to goals / functional outcomes:   GOAL #1   Pt will increase recall of functional information to 90% acc with use of compensatory strategies as needed.   Baseline 0% without cues, 80% with cues   Time 4   Period Weeks   Status Partially Met       SLP SHORT TERM GOAL #2   Title Pt will increase recall of daily appointments, personal recent medical history, and general orientation to [facility] with use of written compensatory strategies as needed.   Baseline Pt not using a personal planning system at this time; Moderate impairment   Time 4   Period Weeks   Status Partially Met       SLP SHORT TERM GOAL #3   Title Pt will increase divergent naming to 7+ items per concrete category with min cues.   Baseline 3 items per category   Time 4   Period Weeks   Status Partially Met       SLP SHORT TERM GOAL #4   Title Pt will increase reading comprehension of paragraph length material to 90% with mi/mod cues and use of compensatory visual attention strategies.   Baseline 80% sentence length   Time 4   Period Weeks   Status Partially Met       SLP SHORT TERM GOAL #5   Title Pt. will complete sequencing tasks in order to locate needed items on her phone with 90% accuracy and minimal assistance.    Baseline 25% accuracy    Time 4   Period Weeks   Status On-going                Long Term Goal #1: Increase cognitive linguistic skills to [facility] with use of strategies and min cues as needed. Plan: Patient agrees to discharge.  Patient goals were not met. Patient is  being discharged due to a change in medical status.  ?????     Goals not met due to unexpected aneurysm,pt. Is in [facility]. Husband reports that she will likely be discharged to a SNF.  Marland Kitchen  Thank you,   [name]. [name] [place], CCC-SLP  Speech-Language Pathologist [phone]     [name] 09/06/2016, 2:26 PM  [place] [address] [place], [place], [address] Phone: [phone]   Fax:  [phone]

## 2016-09-07 DIAGNOSIS — R400 Somnolence: Secondary | ICD-10-CM | POA: Diagnosis not present

## 2016-09-07 DIAGNOSIS — F419 Anxiety disorder, unspecified: Secondary | ICD-10-CM | POA: Diagnosis not present

## 2016-09-07 DIAGNOSIS — R0602 Shortness of breath: Secondary | ICD-10-CM | POA: Diagnosis not present

## 2016-09-07 DIAGNOSIS — E871 Hypo-osmolality and hyponatremia: Secondary | ICD-10-CM | POA: Diagnosis not present

## 2016-09-08 DIAGNOSIS — E871 Hypo-osmolality and hyponatremia: Secondary | ICD-10-CM | POA: Diagnosis not present

## 2016-09-08 DIAGNOSIS — F419 Anxiety disorder, unspecified: Secondary | ICD-10-CM | POA: Diagnosis not present

## 2016-09-08 DIAGNOSIS — G939 Disorder of brain, unspecified: Secondary | ICD-10-CM | POA: Diagnosis not present

## 2016-09-08 DIAGNOSIS — F05 Delirium due to known physiological condition: Secondary | ICD-10-CM | POA: Diagnosis not present

## 2016-09-08 DIAGNOSIS — R0602 Shortness of breath: Secondary | ICD-10-CM | POA: Diagnosis not present

## 2016-09-09 DIAGNOSIS — R0602 Shortness of breath: Secondary | ICD-10-CM | POA: Diagnosis not present

## 2016-09-10 DIAGNOSIS — G40409 Other generalized epilepsy and epileptic syndromes, not intractable, without status epilepticus: Secondary | ICD-10-CM | POA: Diagnosis not present

## 2016-09-10 DIAGNOSIS — R410 Disorientation, unspecified: Secondary | ICD-10-CM | POA: Diagnosis not present

## 2016-09-10 DIAGNOSIS — E871 Hypo-osmolality and hyponatremia: Secondary | ICD-10-CM | POA: Diagnosis not present

## 2016-09-11 ENCOUNTER — Ambulatory Visit (HOSPITAL_COMMUNITY): Payer: PPO

## 2016-09-11 DIAGNOSIS — D6489 Other specified anemias: Secondary | ICD-10-CM | POA: Diagnosis not present

## 2016-09-11 DIAGNOSIS — R1311 Dysphagia, oral phase: Secondary | ICD-10-CM | POA: Diagnosis not present

## 2016-09-11 DIAGNOSIS — E871 Hypo-osmolality and hyponatremia: Secondary | ICD-10-CM | POA: Diagnosis not present

## 2016-09-11 DIAGNOSIS — R41841 Cognitive communication deficit: Secondary | ICD-10-CM | POA: Diagnosis not present

## 2016-09-11 DIAGNOSIS — R5381 Other malaise: Secondary | ICD-10-CM | POA: Diagnosis not present

## 2016-09-11 DIAGNOSIS — K219 Gastro-esophageal reflux disease without esophagitis: Secondary | ICD-10-CM | POA: Diagnosis not present

## 2016-09-11 DIAGNOSIS — R2689 Other abnormalities of gait and mobility: Secondary | ICD-10-CM | POA: Diagnosis not present

## 2016-09-11 DIAGNOSIS — D649 Anemia, unspecified: Secondary | ICD-10-CM | POA: Diagnosis not present

## 2016-09-11 DIAGNOSIS — F418 Other specified anxiety disorders: Secondary | ICD-10-CM | POA: Diagnosis not present

## 2016-09-11 DIAGNOSIS — R278 Other lack of coordination: Secondary | ICD-10-CM | POA: Diagnosis not present

## 2016-09-11 DIAGNOSIS — J17 Pneumonia in diseases classified elsewhere: Secondary | ICD-10-CM | POA: Diagnosis not present

## 2016-09-11 DIAGNOSIS — F329 Major depressive disorder, single episode, unspecified: Secondary | ICD-10-CM | POA: Diagnosis not present

## 2016-09-11 DIAGNOSIS — M6281 Muscle weakness (generalized): Secondary | ICD-10-CM | POA: Diagnosis not present

## 2016-09-11 DIAGNOSIS — G40409 Other generalized epilepsy and epileptic syndromes, not intractable, without status epilepticus: Secondary | ICD-10-CM | POA: Diagnosis not present

## 2016-09-11 DIAGNOSIS — G4089 Other seizures: Secondary | ICD-10-CM | POA: Diagnosis not present

## 2016-09-11 DIAGNOSIS — J690 Pneumonitis due to inhalation of food and vomit: Secondary | ICD-10-CM | POA: Diagnosis not present

## 2016-09-11 DIAGNOSIS — R410 Disorientation, unspecified: Secondary | ICD-10-CM | POA: Diagnosis not present

## 2016-09-11 DIAGNOSIS — F419 Anxiety disorder, unspecified: Secondary | ICD-10-CM | POA: Diagnosis not present

## 2016-09-11 DIAGNOSIS — R609 Edema, unspecified: Secondary | ICD-10-CM | POA: Diagnosis not present

## 2016-09-13 ENCOUNTER — Ambulatory Visit (HOSPITAL_COMMUNITY): Payer: PPO

## 2016-09-15 DIAGNOSIS — M6281 Muscle weakness (generalized): Secondary | ICD-10-CM | POA: Diagnosis not present

## 2016-09-15 DIAGNOSIS — J690 Pneumonitis due to inhalation of food and vomit: Secondary | ICD-10-CM | POA: Diagnosis not present

## 2016-09-15 DIAGNOSIS — R5381 Other malaise: Secondary | ICD-10-CM | POA: Diagnosis not present

## 2016-09-15 DIAGNOSIS — F329 Major depressive disorder, single episode, unspecified: Secondary | ICD-10-CM | POA: Diagnosis not present

## 2016-09-15 DIAGNOSIS — R1311 Dysphagia, oral phase: Secondary | ICD-10-CM | POA: Diagnosis not present

## 2016-09-15 DIAGNOSIS — R2689 Other abnormalities of gait and mobility: Secondary | ICD-10-CM | POA: Diagnosis not present

## 2016-09-15 DIAGNOSIS — R41841 Cognitive communication deficit: Secondary | ICD-10-CM | POA: Diagnosis not present

## 2016-09-15 DIAGNOSIS — R609 Edema, unspecified: Secondary | ICD-10-CM | POA: Diagnosis not present

## 2016-09-15 DIAGNOSIS — G40409 Other generalized epilepsy and epileptic syndromes, not intractable, without status epilepticus: Secondary | ICD-10-CM | POA: Diagnosis not present

## 2016-09-15 DIAGNOSIS — D649 Anemia, unspecified: Secondary | ICD-10-CM | POA: Diagnosis not present

## 2016-09-15 DIAGNOSIS — F419 Anxiety disorder, unspecified: Secondary | ICD-10-CM | POA: Diagnosis not present

## 2016-09-15 DIAGNOSIS — R410 Disorientation, unspecified: Secondary | ICD-10-CM | POA: Diagnosis not present

## 2016-09-15 DIAGNOSIS — K219 Gastro-esophageal reflux disease without esophagitis: Secondary | ICD-10-CM | POA: Diagnosis not present

## 2016-09-15 DIAGNOSIS — R278 Other lack of coordination: Secondary | ICD-10-CM | POA: Diagnosis not present

## 2016-09-18 ENCOUNTER — Ambulatory Visit (HOSPITAL_COMMUNITY): Payer: PPO

## 2016-09-20 ENCOUNTER — Ambulatory Visit (HOSPITAL_COMMUNITY): Payer: PPO

## 2016-09-29 DIAGNOSIS — H40033 Anatomical narrow angle, bilateral: Secondary | ICD-10-CM | POA: Diagnosis not present

## 2016-09-29 DIAGNOSIS — H04123 Dry eye syndrome of bilateral lacrimal glands: Secondary | ICD-10-CM | POA: Diagnosis not present

## 2016-10-06 DIAGNOSIS — I10 Essential (primary) hypertension: Secondary | ICD-10-CM | POA: Diagnosis not present

## 2016-10-06 DIAGNOSIS — I6990 Unspecified sequelae of unspecified cerebrovascular disease: Secondary | ICD-10-CM | POA: Diagnosis not present

## 2016-10-06 DIAGNOSIS — I69351 Hemiplegia and hemiparesis following cerebral infarction affecting right dominant side: Secondary | ICD-10-CM | POA: Diagnosis not present

## 2016-10-06 DIAGNOSIS — F321 Major depressive disorder, single episode, moderate: Secondary | ICD-10-CM | POA: Diagnosis not present

## 2016-10-19 DIAGNOSIS — M6281 Muscle weakness (generalized): Secondary | ICD-10-CM | POA: Diagnosis not present

## 2016-10-19 DIAGNOSIS — R41841 Cognitive communication deficit: Secondary | ICD-10-CM | POA: Diagnosis not present

## 2016-10-19 DIAGNOSIS — R262 Difficulty in walking, not elsewhere classified: Secondary | ICD-10-CM | POA: Diagnosis not present

## 2016-10-24 DIAGNOSIS — I70203 Unspecified atherosclerosis of native arteries of extremities, bilateral legs: Secondary | ICD-10-CM | POA: Diagnosis not present

## 2016-10-24 DIAGNOSIS — B351 Tinea unguium: Secondary | ICD-10-CM | POA: Diagnosis not present

## 2016-10-24 DIAGNOSIS — L84 Corns and callosities: Secondary | ICD-10-CM | POA: Diagnosis not present

## 2016-11-10 DIAGNOSIS — D649 Anemia, unspecified: Secondary | ICD-10-CM | POA: Diagnosis not present

## 2016-11-10 DIAGNOSIS — G40909 Epilepsy, unspecified, not intractable, without status epilepticus: Secondary | ICD-10-CM | POA: Diagnosis not present

## 2016-11-10 DIAGNOSIS — N390 Urinary tract infection, site not specified: Secondary | ICD-10-CM | POA: Diagnosis not present

## 2016-11-10 DIAGNOSIS — N184 Chronic kidney disease, stage 4 (severe): Secondary | ICD-10-CM | POA: Diagnosis not present

## 2016-11-10 DIAGNOSIS — F1021 Alcohol dependence, in remission: Secondary | ICD-10-CM | POA: Diagnosis not present

## 2016-11-10 DIAGNOSIS — Z79899 Other long term (current) drug therapy: Secondary | ICD-10-CM | POA: Diagnosis not present

## 2016-11-10 DIAGNOSIS — Z7982 Long term (current) use of aspirin: Secondary | ICD-10-CM | POA: Diagnosis not present

## 2016-11-10 DIAGNOSIS — R42 Dizziness and giddiness: Secondary | ICD-10-CM | POA: Diagnosis not present

## 2016-11-10 DIAGNOSIS — W1839XA Other fall on same level, initial encounter: Secondary | ICD-10-CM | POA: Diagnosis not present

## 2016-11-10 DIAGNOSIS — N289 Disorder of kidney and ureter, unspecified: Secondary | ICD-10-CM | POA: Diagnosis not present

## 2016-11-10 DIAGNOSIS — I959 Hypotension, unspecified: Secondary | ICD-10-CM | POA: Diagnosis not present

## 2016-11-10 DIAGNOSIS — E860 Dehydration: Secondary | ICD-10-CM | POA: Diagnosis not present

## 2016-11-10 DIAGNOSIS — M7989 Other specified soft tissue disorders: Secondary | ICD-10-CM | POA: Diagnosis not present

## 2016-11-10 DIAGNOSIS — Z8673 Personal history of transient ischemic attack (TIA), and cerebral infarction without residual deficits: Secondary | ICD-10-CM | POA: Diagnosis not present

## 2016-11-10 DIAGNOSIS — S8391XA Sprain of unspecified site of right knee, initial encounter: Secondary | ICD-10-CM | POA: Diagnosis not present

## 2016-11-10 DIAGNOSIS — X501XXA Overexertion from prolonged static or awkward postures, initial encounter: Secondary | ICD-10-CM | POA: Diagnosis not present

## 2016-11-11 DIAGNOSIS — E860 Dehydration: Secondary | ICD-10-CM | POA: Diagnosis not present

## 2016-11-11 DIAGNOSIS — D649 Anemia, unspecified: Secondary | ICD-10-CM | POA: Diagnosis not present

## 2016-11-11 DIAGNOSIS — R42 Dizziness and giddiness: Secondary | ICD-10-CM | POA: Diagnosis not present

## 2016-11-11 DIAGNOSIS — I959 Hypotension, unspecified: Secondary | ICD-10-CM | POA: Diagnosis not present

## 2016-11-16 DIAGNOSIS — I10 Essential (primary) hypertension: Secondary | ICD-10-CM | POA: Diagnosis not present

## 2016-11-16 DIAGNOSIS — M1990 Unspecified osteoarthritis, unspecified site: Secondary | ICD-10-CM | POA: Diagnosis not present

## 2016-11-16 DIAGNOSIS — N390 Urinary tract infection, site not specified: Secondary | ICD-10-CM | POA: Diagnosis not present

## 2016-11-16 DIAGNOSIS — I69351 Hemiplegia and hemiparesis following cerebral infarction affecting right dominant side: Secondary | ICD-10-CM | POA: Diagnosis not present

## 2016-11-21 DIAGNOSIS — M6281 Muscle weakness (generalized): Secondary | ICD-10-CM | POA: Diagnosis not present

## 2016-11-21 DIAGNOSIS — I6931 Attention and concentration deficit following cerebral infarction: Secondary | ICD-10-CM | POA: Diagnosis not present

## 2016-11-21 DIAGNOSIS — I69320 Aphasia following cerebral infarction: Secondary | ICD-10-CM | POA: Diagnosis not present

## 2016-11-21 DIAGNOSIS — R262 Difficulty in walking, not elsewhere classified: Secondary | ICD-10-CM | POA: Diagnosis not present

## 2016-11-21 DIAGNOSIS — I69311 Memory deficit following cerebral infarction: Secondary | ICD-10-CM | POA: Diagnosis not present

## 2016-12-13 ENCOUNTER — Encounter: Payer: Self-pay | Admitting: Orthopaedic Surgery

## 2016-12-15 DIAGNOSIS — M542 Cervicalgia: Secondary | ICD-10-CM | POA: Diagnosis not present

## 2016-12-15 DIAGNOSIS — S3991XA Unspecified injury of abdomen, initial encounter: Secondary | ICD-10-CM | POA: Diagnosis not present

## 2016-12-15 DIAGNOSIS — R109 Unspecified abdominal pain: Secondary | ICD-10-CM | POA: Diagnosis not present

## 2016-12-15 DIAGNOSIS — Z8249 Family history of ischemic heart disease and other diseases of the circulatory system: Secondary | ICD-10-CM | POA: Diagnosis not present

## 2016-12-15 DIAGNOSIS — Z9884 Bariatric surgery status: Secondary | ICD-10-CM | POA: Diagnosis not present

## 2016-12-15 DIAGNOSIS — D649 Anemia, unspecified: Secondary | ICD-10-CM | POA: Diagnosis not present

## 2016-12-15 DIAGNOSIS — S199XXA Unspecified injury of neck, initial encounter: Secondary | ICD-10-CM | POA: Diagnosis not present

## 2016-12-15 DIAGNOSIS — S299XXA Unspecified injury of thorax, initial encounter: Secondary | ICD-10-CM | POA: Diagnosis not present

## 2016-12-15 DIAGNOSIS — S301XXA Contusion of abdominal wall, initial encounter: Secondary | ICD-10-CM | POA: Diagnosis not present

## 2016-12-15 DIAGNOSIS — S0990XA Unspecified injury of head, initial encounter: Secondary | ICD-10-CM | POA: Diagnosis not present

## 2016-12-15 DIAGNOSIS — R079 Chest pain, unspecified: Secondary | ICD-10-CM | POA: Diagnosis not present

## 2016-12-15 DIAGNOSIS — I10 Essential (primary) hypertension: Secondary | ICD-10-CM | POA: Diagnosis not present

## 2016-12-15 DIAGNOSIS — S20211A Contusion of right front wall of thorax, initial encounter: Secondary | ICD-10-CM | POA: Diagnosis not present

## 2016-12-15 DIAGNOSIS — D539 Nutritional anemia, unspecified: Secondary | ICD-10-CM | POA: Diagnosis not present

## 2016-12-15 DIAGNOSIS — S2190XA Unspecified open wound of unspecified part of thorax, initial encounter: Secondary | ICD-10-CM | POA: Diagnosis not present

## 2016-12-15 DIAGNOSIS — Z87891 Personal history of nicotine dependence: Secondary | ICD-10-CM | POA: Diagnosis not present

## 2016-12-28 DIAGNOSIS — M1990 Unspecified osteoarthritis, unspecified site: Secondary | ICD-10-CM | POA: Diagnosis not present

## 2016-12-28 DIAGNOSIS — I10 Essential (primary) hypertension: Secondary | ICD-10-CM | POA: Diagnosis not present

## 2016-12-28 DIAGNOSIS — T148XXA Other injury of unspecified body region, initial encounter: Secondary | ICD-10-CM | POA: Diagnosis not present

## 2017-01-10 DIAGNOSIS — I69311 Memory deficit following cerebral infarction: Secondary | ICD-10-CM | POA: Diagnosis not present

## 2017-01-10 DIAGNOSIS — I6931 Attention and concentration deficit following cerebral infarction: Secondary | ICD-10-CM | POA: Diagnosis not present

## 2017-01-10 DIAGNOSIS — R296 Repeated falls: Secondary | ICD-10-CM | POA: Diagnosis not present

## 2017-01-10 DIAGNOSIS — M6281 Muscle weakness (generalized): Secondary | ICD-10-CM | POA: Diagnosis not present

## 2017-01-10 DIAGNOSIS — I69320 Aphasia following cerebral infarction: Secondary | ICD-10-CM | POA: Diagnosis not present

## 2017-01-15 DIAGNOSIS — G3184 Mild cognitive impairment, so stated: Secondary | ICD-10-CM | POA: Diagnosis not present

## 2017-01-15 DIAGNOSIS — R296 Repeated falls: Secondary | ICD-10-CM | POA: Diagnosis not present

## 2017-01-15 DIAGNOSIS — M6281 Muscle weakness (generalized): Secondary | ICD-10-CM | POA: Diagnosis not present

## 2017-01-15 DIAGNOSIS — I69320 Aphasia following cerebral infarction: Secondary | ICD-10-CM | POA: Diagnosis not present

## 2017-01-19 DIAGNOSIS — G3184 Mild cognitive impairment, so stated: Secondary | ICD-10-CM | POA: Diagnosis not present

## 2017-01-19 DIAGNOSIS — I69320 Aphasia following cerebral infarction: Secondary | ICD-10-CM | POA: Diagnosis not present

## 2017-01-19 DIAGNOSIS — M6281 Muscle weakness (generalized): Secondary | ICD-10-CM | POA: Diagnosis not present

## 2017-01-19 DIAGNOSIS — R296 Repeated falls: Secondary | ICD-10-CM | POA: Diagnosis not present

## 2017-01-22 DIAGNOSIS — R296 Repeated falls: Secondary | ICD-10-CM | POA: Diagnosis not present

## 2017-01-22 DIAGNOSIS — I69320 Aphasia following cerebral infarction: Secondary | ICD-10-CM | POA: Diagnosis not present

## 2017-01-22 DIAGNOSIS — G3184 Mild cognitive impairment, so stated: Secondary | ICD-10-CM | POA: Diagnosis not present

## 2017-01-22 DIAGNOSIS — M6281 Muscle weakness (generalized): Secondary | ICD-10-CM | POA: Diagnosis not present

## 2017-01-26 DIAGNOSIS — G3184 Mild cognitive impairment, so stated: Secondary | ICD-10-CM | POA: Diagnosis not present

## 2017-01-26 DIAGNOSIS — R296 Repeated falls: Secondary | ICD-10-CM | POA: Diagnosis not present

## 2017-01-26 DIAGNOSIS — M6281 Muscle weakness (generalized): Secondary | ICD-10-CM | POA: Diagnosis not present

## 2017-01-26 DIAGNOSIS — I69320 Aphasia following cerebral infarction: Secondary | ICD-10-CM | POA: Diagnosis not present

## 2017-02-02 DIAGNOSIS — R296 Repeated falls: Secondary | ICD-10-CM | POA: Diagnosis not present

## 2017-02-02 DIAGNOSIS — I69320 Aphasia following cerebral infarction: Secondary | ICD-10-CM | POA: Diagnosis not present

## 2017-02-02 DIAGNOSIS — G3184 Mild cognitive impairment, so stated: Secondary | ICD-10-CM | POA: Diagnosis not present

## 2017-02-02 DIAGNOSIS — M6281 Muscle weakness (generalized): Secondary | ICD-10-CM | POA: Diagnosis not present

## 2017-02-07 DIAGNOSIS — G3184 Mild cognitive impairment, so stated: Secondary | ICD-10-CM | POA: Diagnosis not present

## 2017-02-07 DIAGNOSIS — R296 Repeated falls: Secondary | ICD-10-CM | POA: Diagnosis not present

## 2017-02-07 DIAGNOSIS — I69320 Aphasia following cerebral infarction: Secondary | ICD-10-CM | POA: Diagnosis not present

## 2017-02-07 DIAGNOSIS — M6281 Muscle weakness (generalized): Secondary | ICD-10-CM | POA: Diagnosis not present

## 2017-02-09 DIAGNOSIS — R296 Repeated falls: Secondary | ICD-10-CM | POA: Diagnosis not present

## 2017-02-09 DIAGNOSIS — I69320 Aphasia following cerebral infarction: Secondary | ICD-10-CM | POA: Diagnosis not present

## 2017-02-09 DIAGNOSIS — G3184 Mild cognitive impairment, so stated: Secondary | ICD-10-CM | POA: Diagnosis not present

## 2017-02-09 DIAGNOSIS — M6281 Muscle weakness (generalized): Secondary | ICD-10-CM | POA: Diagnosis not present

## 2017-02-12 DIAGNOSIS — M62830 Muscle spasm of back: Secondary | ICD-10-CM | POA: Diagnosis not present

## 2017-02-12 DIAGNOSIS — M170 Bilateral primary osteoarthritis of knee: Secondary | ICD-10-CM | POA: Diagnosis not present

## 2017-02-12 DIAGNOSIS — M545 Low back pain: Secondary | ICD-10-CM | POA: Diagnosis not present

## 2017-02-12 DIAGNOSIS — M5136 Other intervertebral disc degeneration, lumbar region: Secondary | ICD-10-CM | POA: Diagnosis not present

## 2017-02-13 DIAGNOSIS — I10 Essential (primary) hypertension: Secondary | ICD-10-CM | POA: Diagnosis not present

## 2017-02-13 DIAGNOSIS — M545 Low back pain: Secondary | ICD-10-CM | POA: Diagnosis not present

## 2017-02-13 DIAGNOSIS — I6990 Unspecified sequelae of unspecified cerebrovascular disease: Secondary | ICD-10-CM | POA: Diagnosis not present

## 2017-02-13 DIAGNOSIS — I69351 Hemiplegia and hemiparesis following cerebral infarction affecting right dominant side: Secondary | ICD-10-CM | POA: Diagnosis not present

## 2017-02-14 DIAGNOSIS — I69320 Aphasia following cerebral infarction: Secondary | ICD-10-CM | POA: Diagnosis not present

## 2017-02-14 DIAGNOSIS — G3184 Mild cognitive impairment, so stated: Secondary | ICD-10-CM | POA: Diagnosis not present

## 2017-02-14 DIAGNOSIS — M6281 Muscle weakness (generalized): Secondary | ICD-10-CM | POA: Diagnosis not present

## 2017-02-14 DIAGNOSIS — R296 Repeated falls: Secondary | ICD-10-CM | POA: Diagnosis not present

## 2017-02-15 DIAGNOSIS — G3184 Mild cognitive impairment, so stated: Secondary | ICD-10-CM | POA: Diagnosis not present

## 2017-02-15 DIAGNOSIS — R296 Repeated falls: Secondary | ICD-10-CM | POA: Diagnosis not present

## 2017-02-15 DIAGNOSIS — I69320 Aphasia following cerebral infarction: Secondary | ICD-10-CM | POA: Diagnosis not present

## 2017-02-15 DIAGNOSIS — M6281 Muscle weakness (generalized): Secondary | ICD-10-CM | POA: Diagnosis not present

## 2017-02-21 ENCOUNTER — Other Ambulatory Visit (HOSPITAL_COMMUNITY): Payer: Self-pay | Admitting: Pulmonary Disease

## 2017-02-21 DIAGNOSIS — I69320 Aphasia following cerebral infarction: Secondary | ICD-10-CM | POA: Diagnosis not present

## 2017-02-21 DIAGNOSIS — M545 Low back pain: Secondary | ICD-10-CM

## 2017-02-21 DIAGNOSIS — R296 Repeated falls: Secondary | ICD-10-CM | POA: Diagnosis not present

## 2017-02-21 DIAGNOSIS — M5136 Other intervertebral disc degeneration, lumbar region: Secondary | ICD-10-CM | POA: Diagnosis not present

## 2017-02-21 DIAGNOSIS — M170 Bilateral primary osteoarthritis of knee: Secondary | ICD-10-CM | POA: Diagnosis not present

## 2017-02-21 DIAGNOSIS — G3184 Mild cognitive impairment, so stated: Secondary | ICD-10-CM | POA: Diagnosis not present

## 2017-02-21 DIAGNOSIS — M6281 Muscle weakness (generalized): Secondary | ICD-10-CM | POA: Diagnosis not present

## 2017-02-26 DIAGNOSIS — I69320 Aphasia following cerebral infarction: Secondary | ICD-10-CM | POA: Diagnosis not present

## 2017-02-26 DIAGNOSIS — M6281 Muscle weakness (generalized): Secondary | ICD-10-CM | POA: Diagnosis not present

## 2017-02-26 DIAGNOSIS — G3184 Mild cognitive impairment, so stated: Secondary | ICD-10-CM | POA: Diagnosis not present

## 2017-02-26 DIAGNOSIS — R296 Repeated falls: Secondary | ICD-10-CM | POA: Diagnosis not present

## 2017-02-27 ENCOUNTER — Ambulatory Visit (HOSPITAL_COMMUNITY)
Admission: RE | Admit: 2017-02-27 | Discharge: 2017-02-27 | Disposition: A | Discharge status: Home / Self Care | Payer: Medicare PPO | Source: Ambulatory Visit | Attending: Pulmonary Disease | Admitting: Pulmonary Disease

## 2017-02-27 DIAGNOSIS — M545 Low back pain: Secondary | ICD-10-CM | POA: Diagnosis not present

## 2017-02-27 DIAGNOSIS — M4186 Other forms of scoliosis, lumbar region: Secondary | ICD-10-CM | POA: Insufficient documentation

## 2017-02-27 DIAGNOSIS — M438X4 Other specified deforming dorsopathies, thoracic region: Secondary | ICD-10-CM | POA: Diagnosis not present

## 2017-02-27 DIAGNOSIS — M48061 Spinal stenosis, lumbar region without neurogenic claudication: Secondary | ICD-10-CM | POA: Insufficient documentation

## 2017-02-27 DIAGNOSIS — M5126 Other intervertebral disc displacement, lumbar region: Secondary | ICD-10-CM | POA: Diagnosis not present

## 2017-02-27 DIAGNOSIS — M4306 Spondylolysis, lumbar region: Secondary | ICD-10-CM | POA: Diagnosis not present

## 2017-02-28 DIAGNOSIS — R296 Repeated falls: Secondary | ICD-10-CM | POA: Diagnosis not present

## 2017-02-28 DIAGNOSIS — I69320 Aphasia following cerebral infarction: Secondary | ICD-10-CM | POA: Diagnosis not present

## 2017-02-28 DIAGNOSIS — M6281 Muscle weakness (generalized): Secondary | ICD-10-CM | POA: Diagnosis not present

## 2017-02-28 DIAGNOSIS — G3184 Mild cognitive impairment, so stated: Secondary | ICD-10-CM | POA: Diagnosis not present

## 2017-03-03 DIAGNOSIS — I480 Paroxysmal atrial fibrillation: Secondary | ICD-10-CM | POA: Diagnosis not present

## 2017-03-03 DIAGNOSIS — G40909 Epilepsy, unspecified, not intractable, without status epilepticus: Secondary | ICD-10-CM | POA: Diagnosis not present

## 2017-03-03 DIAGNOSIS — R531 Weakness: Secondary | ICD-10-CM | POA: Diagnosis not present

## 2017-03-03 DIAGNOSIS — Z87891 Personal history of nicotine dependence: Secondary | ICD-10-CM | POA: Insufficient documentation

## 2017-03-03 DIAGNOSIS — Z9884 Bariatric surgery status: Secondary | ICD-10-CM | POA: Insufficient documentation

## 2017-03-03 DIAGNOSIS — N179 Acute kidney failure, unspecified: Secondary | ICD-10-CM | POA: Diagnosis not present

## 2017-03-03 DIAGNOSIS — G9340 Encephalopathy, unspecified: Secondary | ICD-10-CM | POA: Diagnosis not present

## 2017-03-03 DIAGNOSIS — F419 Anxiety disorder, unspecified: Secondary | ICD-10-CM | POA: Diagnosis not present

## 2017-03-03 DIAGNOSIS — F329 Major depressive disorder, single episode, unspecified: Secondary | ICD-10-CM | POA: Diagnosis not present

## 2017-03-03 DIAGNOSIS — G319 Degenerative disease of nervous system, unspecified: Secondary | ICD-10-CM | POA: Diagnosis not present

## 2017-03-03 DIAGNOSIS — G9389 Other specified disorders of brain: Secondary | ICD-10-CM | POA: Diagnosis not present

## 2017-03-03 DIAGNOSIS — I10 Essential (primary) hypertension: Secondary | ICD-10-CM | POA: Diagnosis not present

## 2017-03-03 DIAGNOSIS — R1310 Dysphagia, unspecified: Secondary | ICD-10-CM | POA: Diagnosis not present

## 2017-03-03 DIAGNOSIS — I69398 Other sequelae of cerebral infarction: Secondary | ICD-10-CM | POA: Diagnosis not present

## 2017-03-03 DIAGNOSIS — R064 Hyperventilation: Secondary | ICD-10-CM | POA: Diagnosis not present

## 2017-03-03 DIAGNOSIS — E876 Hypokalemia: Secondary | ICD-10-CM | POA: Diagnosis not present

## 2017-03-03 DIAGNOSIS — R001 Bradycardia, unspecified: Secondary | ICD-10-CM | POA: Diagnosis not present

## 2017-03-03 DIAGNOSIS — I639 Cerebral infarction, unspecified: Secondary | ICD-10-CM | POA: Diagnosis not present

## 2017-03-03 DIAGNOSIS — Z7982 Long term (current) use of aspirin: Secondary | ICD-10-CM | POA: Diagnosis not present

## 2017-03-03 DIAGNOSIS — I672 Cerebral atherosclerosis: Secondary | ICD-10-CM | POA: Diagnosis not present

## 2017-03-03 DIAGNOSIS — I6782 Cerebral ischemia: Secondary | ICD-10-CM | POA: Diagnosis not present

## 2017-03-03 DIAGNOSIS — I6523 Occlusion and stenosis of bilateral carotid arteries: Secondary | ICD-10-CM | POA: Diagnosis not present

## 2017-03-03 DIAGNOSIS — R4702 Dysphasia: Secondary | ICD-10-CM | POA: Diagnosis not present

## 2017-03-03 DIAGNOSIS — I638 Other cerebral infarction: Secondary | ICD-10-CM | POA: Diagnosis not present

## 2017-03-03 DIAGNOSIS — R4701 Aphasia: Secondary | ICD-10-CM | POA: Diagnosis not present

## 2017-03-03 DIAGNOSIS — I6789 Other cerebrovascular disease: Secondary | ICD-10-CM | POA: Diagnosis not present

## 2017-03-03 DIAGNOSIS — R4182 Altered mental status, unspecified: Secondary | ICD-10-CM | POA: Diagnosis not present

## 2017-03-03 DIAGNOSIS — I081 Rheumatic disorders of both mitral and tricuspid valves: Secondary | ICD-10-CM | POA: Diagnosis not present

## 2017-03-03 DIAGNOSIS — Z8673 Personal history of transient ischemic attack (TIA), and cerebral infarction without residual deficits: Secondary | ICD-10-CM | POA: Diagnosis not present

## 2017-03-03 DIAGNOSIS — I517 Cardiomegaly: Secondary | ICD-10-CM | POA: Diagnosis not present

## 2017-03-03 DIAGNOSIS — I6623 Occlusion and stenosis of bilateral posterior cerebral arteries: Secondary | ICD-10-CM | POA: Diagnosis not present

## 2017-03-03 DIAGNOSIS — E872 Acidosis: Secondary | ICD-10-CM | POA: Diagnosis not present

## 2017-03-05 DIAGNOSIS — I639 Cerebral infarction, unspecified: Secondary | ICD-10-CM | POA: Insufficient documentation

## 2017-03-05 DIAGNOSIS — R001 Bradycardia, unspecified: Secondary | ICD-10-CM | POA: Insufficient documentation

## 2017-03-05 DIAGNOSIS — I480 Paroxysmal atrial fibrillation: Secondary | ICD-10-CM | POA: Insufficient documentation

## 2017-03-13 DIAGNOSIS — G40909 Epilepsy, unspecified, not intractable, without status epilepticus: Secondary | ICD-10-CM | POA: Diagnosis not present

## 2017-03-13 DIAGNOSIS — F329 Major depressive disorder, single episode, unspecified: Secondary | ICD-10-CM | POA: Diagnosis not present

## 2017-03-13 DIAGNOSIS — G9340 Encephalopathy, unspecified: Secondary | ICD-10-CM | POA: Diagnosis not present

## 2017-03-13 DIAGNOSIS — I10 Essential (primary) hypertension: Secondary | ICD-10-CM | POA: Diagnosis not present

## 2017-03-13 DIAGNOSIS — F419 Anxiety disorder, unspecified: Secondary | ICD-10-CM | POA: Diagnosis not present

## 2017-03-13 DIAGNOSIS — I480 Paroxysmal atrial fibrillation: Secondary | ICD-10-CM | POA: Diagnosis not present

## 2017-03-13 DIAGNOSIS — I69398 Other sequelae of cerebral infarction: Secondary | ICD-10-CM | POA: Diagnosis not present

## 2017-03-15 DIAGNOSIS — F329 Major depressive disorder, single episode, unspecified: Secondary | ICD-10-CM | POA: Diagnosis not present

## 2017-03-15 DIAGNOSIS — F419 Anxiety disorder, unspecified: Secondary | ICD-10-CM | POA: Diagnosis not present

## 2017-03-15 DIAGNOSIS — I480 Paroxysmal atrial fibrillation: Secondary | ICD-10-CM | POA: Diagnosis not present

## 2017-03-15 DIAGNOSIS — G40909 Epilepsy, unspecified, not intractable, without status epilepticus: Secondary | ICD-10-CM | POA: Diagnosis not present

## 2017-03-15 DIAGNOSIS — I69398 Other sequelae of cerebral infarction: Secondary | ICD-10-CM | POA: Diagnosis not present

## 2017-03-15 DIAGNOSIS — I10 Essential (primary) hypertension: Secondary | ICD-10-CM | POA: Diagnosis not present

## 2017-03-15 DIAGNOSIS — G9340 Encephalopathy, unspecified: Secondary | ICD-10-CM | POA: Diagnosis not present

## 2017-03-16 DIAGNOSIS — I69351 Hemiplegia and hemiparesis following cerebral infarction affecting right dominant side: Secondary | ICD-10-CM | POA: Diagnosis not present

## 2017-03-16 DIAGNOSIS — M545 Low back pain: Secondary | ICD-10-CM | POA: Diagnosis not present

## 2017-03-16 DIAGNOSIS — F321 Major depressive disorder, single episode, moderate: Secondary | ICD-10-CM | POA: Diagnosis not present

## 2017-03-16 DIAGNOSIS — I10 Essential (primary) hypertension: Secondary | ICD-10-CM | POA: Diagnosis not present

## 2017-03-19 DIAGNOSIS — G9340 Encephalopathy, unspecified: Secondary | ICD-10-CM | POA: Diagnosis not present

## 2017-03-19 DIAGNOSIS — I480 Paroxysmal atrial fibrillation: Secondary | ICD-10-CM | POA: Diagnosis not present

## 2017-03-19 DIAGNOSIS — I69398 Other sequelae of cerebral infarction: Secondary | ICD-10-CM | POA: Diagnosis not present

## 2017-03-19 DIAGNOSIS — I10 Essential (primary) hypertension: Secondary | ICD-10-CM | POA: Diagnosis not present

## 2017-03-19 DIAGNOSIS — F419 Anxiety disorder, unspecified: Secondary | ICD-10-CM | POA: Diagnosis not present

## 2017-03-19 DIAGNOSIS — F329 Major depressive disorder, single episode, unspecified: Secondary | ICD-10-CM | POA: Diagnosis not present

## 2017-03-19 DIAGNOSIS — G40909 Epilepsy, unspecified, not intractable, without status epilepticus: Secondary | ICD-10-CM | POA: Diagnosis not present

## 2017-03-21 DIAGNOSIS — I480 Paroxysmal atrial fibrillation: Secondary | ICD-10-CM | POA: Diagnosis not present

## 2017-03-21 DIAGNOSIS — I10 Essential (primary) hypertension: Secondary | ICD-10-CM | POA: Diagnosis not present

## 2017-03-21 DIAGNOSIS — I69398 Other sequelae of cerebral infarction: Secondary | ICD-10-CM | POA: Diagnosis not present

## 2017-03-21 DIAGNOSIS — F419 Anxiety disorder, unspecified: Secondary | ICD-10-CM | POA: Diagnosis not present

## 2017-03-21 DIAGNOSIS — G40909 Epilepsy, unspecified, not intractable, without status epilepticus: Secondary | ICD-10-CM | POA: Diagnosis not present

## 2017-03-21 DIAGNOSIS — F329 Major depressive disorder, single episode, unspecified: Secondary | ICD-10-CM | POA: Diagnosis not present

## 2017-03-21 DIAGNOSIS — I639 Cerebral infarction, unspecified: Secondary | ICD-10-CM | POA: Diagnosis not present

## 2017-03-30 DIAGNOSIS — R262 Difficulty in walking, not elsewhere classified: Secondary | ICD-10-CM | POA: Diagnosis not present

## 2017-03-30 DIAGNOSIS — M6281 Muscle weakness (generalized): Secondary | ICD-10-CM | POA: Diagnosis not present

## 2017-03-30 DIAGNOSIS — Z8673 Personal history of transient ischemic attack (TIA), and cerebral infarction without residual deficits: Secondary | ICD-10-CM | POA: Diagnosis not present

## 2017-04-03 DIAGNOSIS — M6281 Muscle weakness (generalized): Secondary | ICD-10-CM | POA: Diagnosis not present

## 2017-04-03 DIAGNOSIS — R262 Difficulty in walking, not elsewhere classified: Secondary | ICD-10-CM | POA: Diagnosis not present

## 2017-04-03 DIAGNOSIS — Z8673 Personal history of transient ischemic attack (TIA), and cerebral infarction without residual deficits: Secondary | ICD-10-CM | POA: Diagnosis not present

## 2017-04-05 DIAGNOSIS — F329 Major depressive disorder, single episode, unspecified: Secondary | ICD-10-CM | POA: Diagnosis not present

## 2017-04-05 DIAGNOSIS — I480 Paroxysmal atrial fibrillation: Secondary | ICD-10-CM | POA: Diagnosis not present

## 2017-04-05 DIAGNOSIS — I10 Essential (primary) hypertension: Secondary | ICD-10-CM | POA: Diagnosis not present

## 2017-04-05 DIAGNOSIS — I69398 Other sequelae of cerebral infarction: Secondary | ICD-10-CM | POA: Diagnosis not present

## 2017-04-05 DIAGNOSIS — F419 Anxiety disorder, unspecified: Secondary | ICD-10-CM | POA: Diagnosis not present

## 2017-04-05 DIAGNOSIS — G9340 Encephalopathy, unspecified: Secondary | ICD-10-CM | POA: Diagnosis not present

## 2017-04-05 DIAGNOSIS — G40909 Epilepsy, unspecified, not intractable, without status epilepticus: Secondary | ICD-10-CM | POA: Diagnosis not present

## 2017-04-06 DIAGNOSIS — M6281 Muscle weakness (generalized): Secondary | ICD-10-CM | POA: Diagnosis not present

## 2017-04-06 DIAGNOSIS — R262 Difficulty in walking, not elsewhere classified: Secondary | ICD-10-CM | POA: Diagnosis not present

## 2017-04-06 DIAGNOSIS — Z8673 Personal history of transient ischemic attack (TIA), and cerebral infarction without residual deficits: Secondary | ICD-10-CM | POA: Diagnosis not present

## 2017-04-09 DIAGNOSIS — R262 Difficulty in walking, not elsewhere classified: Secondary | ICD-10-CM | POA: Diagnosis not present

## 2017-04-09 DIAGNOSIS — M6281 Muscle weakness (generalized): Secondary | ICD-10-CM | POA: Diagnosis not present

## 2017-04-09 DIAGNOSIS — Z8673 Personal history of transient ischemic attack (TIA), and cerebral infarction without residual deficits: Secondary | ICD-10-CM | POA: Diagnosis not present

## 2017-04-10 DIAGNOSIS — G44219 Episodic tension-type headache, not intractable: Secondary | ICD-10-CM | POA: Diagnosis not present

## 2017-04-10 DIAGNOSIS — H40033 Anatomical narrow angle, bilateral: Secondary | ICD-10-CM | POA: Diagnosis not present

## 2017-04-12 DIAGNOSIS — M6281 Muscle weakness (generalized): Secondary | ICD-10-CM | POA: Diagnosis not present

## 2017-04-12 DIAGNOSIS — R262 Difficulty in walking, not elsewhere classified: Secondary | ICD-10-CM | POA: Diagnosis not present

## 2017-04-12 DIAGNOSIS — Z8673 Personal history of transient ischemic attack (TIA), and cerebral infarction without residual deficits: Secondary | ICD-10-CM | POA: Diagnosis not present

## 2017-04-19 DIAGNOSIS — M6281 Muscle weakness (generalized): Secondary | ICD-10-CM | POA: Diagnosis not present

## 2017-04-19 DIAGNOSIS — I69320 Aphasia following cerebral infarction: Secondary | ICD-10-CM | POA: Diagnosis not present

## 2017-04-19 DIAGNOSIS — I69311 Memory deficit following cerebral infarction: Secondary | ICD-10-CM | POA: Diagnosis not present

## 2017-04-24 DIAGNOSIS — M6281 Muscle weakness (generalized): Secondary | ICD-10-CM | POA: Diagnosis not present

## 2017-04-24 DIAGNOSIS — I69311 Memory deficit following cerebral infarction: Secondary | ICD-10-CM | POA: Diagnosis not present

## 2017-04-24 DIAGNOSIS — I69320 Aphasia following cerebral infarction: Secondary | ICD-10-CM | POA: Diagnosis not present

## 2017-04-27 DIAGNOSIS — M6281 Muscle weakness (generalized): Secondary | ICD-10-CM | POA: Diagnosis not present

## 2017-04-27 DIAGNOSIS — I69311 Memory deficit following cerebral infarction: Secondary | ICD-10-CM | POA: Diagnosis not present

## 2017-04-27 DIAGNOSIS — I69320 Aphasia following cerebral infarction: Secondary | ICD-10-CM | POA: Diagnosis not present

## 2017-05-02 DIAGNOSIS — M6281 Muscle weakness (generalized): Secondary | ICD-10-CM | POA: Diagnosis not present

## 2017-05-02 DIAGNOSIS — I69311 Memory deficit following cerebral infarction: Secondary | ICD-10-CM | POA: Diagnosis not present

## 2017-05-02 DIAGNOSIS — I69320 Aphasia following cerebral infarction: Secondary | ICD-10-CM | POA: Diagnosis not present

## 2017-05-03 DIAGNOSIS — I69311 Memory deficit following cerebral infarction: Secondary | ICD-10-CM | POA: Diagnosis not present

## 2017-05-03 DIAGNOSIS — I69320 Aphasia following cerebral infarction: Secondary | ICD-10-CM | POA: Diagnosis not present

## 2017-05-03 DIAGNOSIS — M6281 Muscle weakness (generalized): Secondary | ICD-10-CM | POA: Diagnosis not present

## 2017-05-08 DIAGNOSIS — M6281 Muscle weakness (generalized): Secondary | ICD-10-CM | POA: Diagnosis not present

## 2017-05-08 DIAGNOSIS — I69320 Aphasia following cerebral infarction: Secondary | ICD-10-CM | POA: Diagnosis not present

## 2017-05-08 DIAGNOSIS — I69311 Memory deficit following cerebral infarction: Secondary | ICD-10-CM | POA: Diagnosis not present

## 2017-05-10 DIAGNOSIS — I69320 Aphasia following cerebral infarction: Secondary | ICD-10-CM | POA: Diagnosis not present

## 2017-05-10 DIAGNOSIS — M6281 Muscle weakness (generalized): Secondary | ICD-10-CM | POA: Diagnosis not present

## 2017-05-10 DIAGNOSIS — I69311 Memory deficit following cerebral infarction: Secondary | ICD-10-CM | POA: Diagnosis not present

## 2017-05-15 DIAGNOSIS — M6281 Muscle weakness (generalized): Secondary | ICD-10-CM | POA: Diagnosis not present

## 2017-05-15 DIAGNOSIS — I69311 Memory deficit following cerebral infarction: Secondary | ICD-10-CM | POA: Diagnosis not present

## 2017-05-15 DIAGNOSIS — I69320 Aphasia following cerebral infarction: Secondary | ICD-10-CM | POA: Diagnosis not present

## 2017-05-16 DIAGNOSIS — M545 Low back pain: Secondary | ICD-10-CM | POA: Diagnosis not present

## 2017-05-16 DIAGNOSIS — M1991 Primary osteoarthritis, unspecified site: Secondary | ICD-10-CM | POA: Diagnosis not present

## 2017-05-16 DIAGNOSIS — I69351 Hemiplegia and hemiparesis following cerebral infarction affecting right dominant side: Secondary | ICD-10-CM | POA: Diagnosis not present

## 2017-05-16 DIAGNOSIS — F419 Anxiety disorder, unspecified: Secondary | ICD-10-CM | POA: Diagnosis not present

## 2017-05-17 DIAGNOSIS — R41841 Cognitive communication deficit: Secondary | ICD-10-CM | POA: Diagnosis not present

## 2017-05-17 DIAGNOSIS — M6281 Muscle weakness (generalized): Secondary | ICD-10-CM | POA: Diagnosis not present

## 2017-05-18 DIAGNOSIS — M1991 Primary osteoarthritis, unspecified site: Secondary | ICD-10-CM | POA: Diagnosis not present

## 2017-05-18 DIAGNOSIS — F419 Anxiety disorder, unspecified: Secondary | ICD-10-CM | POA: Diagnosis not present

## 2017-05-18 DIAGNOSIS — M545 Low back pain: Secondary | ICD-10-CM | POA: Diagnosis not present

## 2017-05-18 DIAGNOSIS — I69351 Hemiplegia and hemiparesis following cerebral infarction affecting right dominant side: Secondary | ICD-10-CM | POA: Diagnosis not present

## 2017-05-18 LAB — CBC AND DIFFERENTIAL
HCT: 31 — AB (ref 36–46)
Hemoglobin: 10.2 — AB (ref 12.0–16.0)
Platelets: 208 (ref 150–399)
WBC: 3.6

## 2017-05-18 LAB — TSH: TSH: 2.83 (ref <=5.90)

## 2017-05-18 LAB — CBC: RBC: 3.4 — AB (ref 3.87–5.11)

## 2017-05-19 LAB — LIPID PANEL
Cholesterol: 141 (ref 0–200)
HDL: 48 (ref 35–70)
LDL Cholesterol: 64
Triglycerides: 144 (ref 40–160)

## 2017-05-21 DIAGNOSIS — M6281 Muscle weakness (generalized): Secondary | ICD-10-CM | POA: Diagnosis not present

## 2017-05-21 DIAGNOSIS — R41841 Cognitive communication deficit: Secondary | ICD-10-CM | POA: Diagnosis not present

## 2017-05-24 DIAGNOSIS — M6281 Muscle weakness (generalized): Secondary | ICD-10-CM | POA: Diagnosis not present

## 2017-05-24 DIAGNOSIS — R41841 Cognitive communication deficit: Secondary | ICD-10-CM | POA: Diagnosis not present

## 2017-05-29 DIAGNOSIS — M79601 Pain in right arm: Secondary | ICD-10-CM | POA: Diagnosis not present

## 2017-05-29 DIAGNOSIS — F5101 Primary insomnia: Secondary | ICD-10-CM | POA: Diagnosis not present

## 2017-05-29 DIAGNOSIS — E782 Mixed hyperlipidemia: Secondary | ICD-10-CM | POA: Diagnosis not present

## 2017-05-29 DIAGNOSIS — M542 Cervicalgia: Secondary | ICD-10-CM | POA: Diagnosis not present

## 2017-05-29 DIAGNOSIS — M48062 Spinal stenosis, lumbar region with neurogenic claudication: Secondary | ICD-10-CM | POA: Diagnosis not present

## 2017-05-29 DIAGNOSIS — M545 Low back pain: Secondary | ICD-10-CM | POA: Diagnosis not present

## 2017-05-29 DIAGNOSIS — Z79899 Other long term (current) drug therapy: Secondary | ICD-10-CM | POA: Diagnosis not present

## 2017-06-01 DIAGNOSIS — R41841 Cognitive communication deficit: Secondary | ICD-10-CM | POA: Diagnosis not present

## 2017-06-01 DIAGNOSIS — M6281 Muscle weakness (generalized): Secondary | ICD-10-CM | POA: Diagnosis not present

## 2017-06-04 ENCOUNTER — Other Ambulatory Visit: Payer: Self-pay | Admitting: Neurology

## 2017-06-04 DIAGNOSIS — M6281 Muscle weakness (generalized): Secondary | ICD-10-CM | POA: Diagnosis not present

## 2017-06-04 DIAGNOSIS — R41841 Cognitive communication deficit: Secondary | ICD-10-CM | POA: Diagnosis not present

## 2017-06-04 DIAGNOSIS — G8929 Other chronic pain: Secondary | ICD-10-CM

## 2017-06-04 DIAGNOSIS — M545 Low back pain: Principal | ICD-10-CM

## 2017-06-07 DIAGNOSIS — M6281 Muscle weakness (generalized): Secondary | ICD-10-CM | POA: Diagnosis not present

## 2017-06-07 DIAGNOSIS — R41841 Cognitive communication deficit: Secondary | ICD-10-CM | POA: Diagnosis not present

## 2017-06-11 DIAGNOSIS — M6281 Muscle weakness (generalized): Secondary | ICD-10-CM | POA: Diagnosis not present

## 2017-06-11 DIAGNOSIS — R41841 Cognitive communication deficit: Secondary | ICD-10-CM | POA: Diagnosis not present

## 2017-06-15 DIAGNOSIS — M6281 Muscle weakness (generalized): Secondary | ICD-10-CM | POA: Diagnosis not present

## 2017-06-15 DIAGNOSIS — R41841 Cognitive communication deficit: Secondary | ICD-10-CM | POA: Diagnosis not present

## 2017-06-20 DIAGNOSIS — M6281 Muscle weakness (generalized): Secondary | ICD-10-CM | POA: Diagnosis not present

## 2017-06-20 DIAGNOSIS — R41841 Cognitive communication deficit: Secondary | ICD-10-CM | POA: Diagnosis not present

## 2017-06-22 DIAGNOSIS — M6281 Muscle weakness (generalized): Secondary | ICD-10-CM | POA: Diagnosis not present

## 2017-06-22 DIAGNOSIS — R41841 Cognitive communication deficit: Secondary | ICD-10-CM | POA: Diagnosis not present

## 2017-06-25 DIAGNOSIS — M6281 Muscle weakness (generalized): Secondary | ICD-10-CM | POA: Diagnosis not present

## 2017-06-25 DIAGNOSIS — R41841 Cognitive communication deficit: Secondary | ICD-10-CM | POA: Diagnosis not present

## 2017-07-02 ENCOUNTER — Ambulatory Visit
Admission: RE | Admit: 2017-07-02 | Discharge: 2017-07-02 | Disposition: A | Discharge status: Home / Self Care | Payer: Medicare PPO | Source: Ambulatory Visit | Attending: Neurology | Admitting: Neurology

## 2017-07-02 DIAGNOSIS — M545 Low back pain: Secondary | ICD-10-CM | POA: Diagnosis not present

## 2017-07-02 DIAGNOSIS — G8929 Other chronic pain: Secondary | ICD-10-CM

## 2017-07-02 MED ORDER — IOPAMIDOL (ISOVUE-M 200) INJECTION 41%
1.0000 mL | Freq: Once | INTRAMUSCULAR | Status: AC
  Administered 2017-07-02: 1 mL via EPIDURAL

## 2017-07-02 MED ORDER — METHYLPREDNISOLONE ACETATE 40 MG/ML INJ SUSP (RADIOLOG
120.0000 mg | Freq: Once | INTRAMUSCULAR | Status: AC
  Administered 2017-07-02: 120 mg via EPIDURAL

## 2017-07-02 NOTE — Discharge Instructions (Signed)
Post Procedure Spinal Discharge Instruction Sheet  1. You may resume a regular diet and any medications that you routinely take (including pain medications).  2. No driving day of procedure.  3. Light activity throughout the rest of the day.  Do not do any strenuous work, exercise, bending or lifting.  The day following the procedure, you can resume normal physical activity but you should refrain from exercising or physical therapy for at least three days thereafter.   Common Side Effects:   Headaches- take your usual medications as directed by your physician.  Increase your fluid intake.  Caffeinated beverages may be helpful.  Lie flat in bed until your headache resolves.   Restlessness or inability to sleep- you may have trouble sleeping for the next few days.  Ask your referring physician if you need any medication for sleep.   Facial flushing or redness- should subside within a few days.   Increased pain- a temporary increase in pain a day or two following your procedure is not unusual.  Take your pain medication as prescribed by your referring physician.   Leg cramps  Please contact our office at [phone] for the following symptoms:  Fever greater than 100 degrees.  Headaches unresolved with medication after 2-3 days.  Increased swelling, pain, or redness at injection site.  Thank you for visiting our office.   MAY RESUME BC OR GOODY POWDERS TODAY.

## 2017-07-09 DIAGNOSIS — M545 Low back pain: Secondary | ICD-10-CM | POA: Diagnosis not present

## 2017-07-09 DIAGNOSIS — M79601 Pain in right arm: Secondary | ICD-10-CM | POA: Diagnosis not present

## 2017-07-09 DIAGNOSIS — M48062 Spinal stenosis, lumbar region with neurogenic claudication: Secondary | ICD-10-CM | POA: Diagnosis not present

## 2017-07-09 DIAGNOSIS — F5101 Primary insomnia: Secondary | ICD-10-CM | POA: Diagnosis not present

## 2017-07-30 DIAGNOSIS — R41841 Cognitive communication deficit: Secondary | ICD-10-CM | POA: Diagnosis not present

## 2017-07-30 DIAGNOSIS — M6281 Muscle weakness (generalized): Secondary | ICD-10-CM | POA: Diagnosis not present

## 2017-08-02 DIAGNOSIS — R41841 Cognitive communication deficit: Secondary | ICD-10-CM | POA: Diagnosis not present

## 2017-08-02 DIAGNOSIS — M6281 Muscle weakness (generalized): Secondary | ICD-10-CM | POA: Diagnosis not present

## 2017-08-06 DIAGNOSIS — R41841 Cognitive communication deficit: Secondary | ICD-10-CM | POA: Diagnosis not present

## 2017-08-06 DIAGNOSIS — M6281 Muscle weakness (generalized): Secondary | ICD-10-CM | POA: Diagnosis not present

## 2017-08-08 DIAGNOSIS — Z79891 Long term (current) use of opiate analgesic: Secondary | ICD-10-CM | POA: Diagnosis not present

## 2017-08-08 DIAGNOSIS — G4089 Other seizures: Secondary | ICD-10-CM | POA: Diagnosis not present

## 2017-08-08 DIAGNOSIS — Z79899 Other long term (current) drug therapy: Secondary | ICD-10-CM | POA: Diagnosis not present

## 2017-08-08 DIAGNOSIS — M48062 Spinal stenosis, lumbar region with neurogenic claudication: Secondary | ICD-10-CM | POA: Diagnosis not present

## 2017-08-08 DIAGNOSIS — M545 Low back pain: Secondary | ICD-10-CM | POA: Diagnosis not present

## 2017-08-09 DIAGNOSIS — R41841 Cognitive communication deficit: Secondary | ICD-10-CM | POA: Diagnosis not present

## 2017-08-09 DIAGNOSIS — M6281 Muscle weakness (generalized): Secondary | ICD-10-CM | POA: Diagnosis not present

## 2017-08-13 DIAGNOSIS — R41841 Cognitive communication deficit: Secondary | ICD-10-CM | POA: Diagnosis not present

## 2017-08-13 DIAGNOSIS — M6281 Muscle weakness (generalized): Secondary | ICD-10-CM | POA: Diagnosis not present

## 2017-08-15 DIAGNOSIS — I69351 Hemiplegia and hemiparesis following cerebral infarction affecting right dominant side: Secondary | ICD-10-CM | POA: Diagnosis not present

## 2017-08-15 DIAGNOSIS — M545 Low back pain: Secondary | ICD-10-CM | POA: Diagnosis not present

## 2017-08-15 DIAGNOSIS — I10 Essential (primary) hypertension: Secondary | ICD-10-CM | POA: Diagnosis not present

## 2017-08-15 DIAGNOSIS — M1991 Primary osteoarthritis, unspecified site: Secondary | ICD-10-CM | POA: Diagnosis not present

## 2017-08-20 DIAGNOSIS — M6281 Muscle weakness (generalized): Secondary | ICD-10-CM | POA: Diagnosis not present

## 2017-08-20 DIAGNOSIS — R41841 Cognitive communication deficit: Secondary | ICD-10-CM | POA: Diagnosis not present

## 2017-08-23 DIAGNOSIS — M6281 Muscle weakness (generalized): Secondary | ICD-10-CM | POA: Diagnosis not present

## 2017-08-23 DIAGNOSIS — R41841 Cognitive communication deficit: Secondary | ICD-10-CM | POA: Diagnosis not present

## 2017-09-19 DIAGNOSIS — R41841 Cognitive communication deficit: Secondary | ICD-10-CM | POA: Diagnosis not present

## 2017-09-19 DIAGNOSIS — M6281 Muscle weakness (generalized): Secondary | ICD-10-CM | POA: Diagnosis not present

## 2017-09-21 DIAGNOSIS — M6281 Muscle weakness (generalized): Secondary | ICD-10-CM | POA: Diagnosis not present

## 2017-09-21 DIAGNOSIS — R41841 Cognitive communication deficit: Secondary | ICD-10-CM | POA: Diagnosis not present

## 2017-10-04 DIAGNOSIS — M6281 Muscle weakness (generalized): Secondary | ICD-10-CM | POA: Diagnosis not present

## 2017-10-04 DIAGNOSIS — R41841 Cognitive communication deficit: Secondary | ICD-10-CM | POA: Diagnosis not present

## 2017-10-05 DIAGNOSIS — M6281 Muscle weakness (generalized): Secondary | ICD-10-CM | POA: Diagnosis not present

## 2017-10-05 DIAGNOSIS — R41841 Cognitive communication deficit: Secondary | ICD-10-CM | POA: Diagnosis not present

## 2017-10-19 DIAGNOSIS — R41841 Cognitive communication deficit: Secondary | ICD-10-CM | POA: Diagnosis not present

## 2017-10-19 DIAGNOSIS — M6281 Muscle weakness (generalized): Secondary | ICD-10-CM | POA: Diagnosis not present

## 2017-10-22 DIAGNOSIS — M6281 Muscle weakness (generalized): Secondary | ICD-10-CM | POA: Diagnosis not present

## 2017-10-22 DIAGNOSIS — R41841 Cognitive communication deficit: Secondary | ICD-10-CM | POA: Diagnosis not present

## 2017-10-25 DIAGNOSIS — R41841 Cognitive communication deficit: Secondary | ICD-10-CM | POA: Diagnosis not present

## 2017-10-25 DIAGNOSIS — M6281 Muscle weakness (generalized): Secondary | ICD-10-CM | POA: Diagnosis not present

## 2017-10-31 DIAGNOSIS — M6281 Muscle weakness (generalized): Secondary | ICD-10-CM | POA: Diagnosis not present

## 2017-10-31 DIAGNOSIS — R41841 Cognitive communication deficit: Secondary | ICD-10-CM | POA: Diagnosis not present

## 2017-11-02 DIAGNOSIS — R41841 Cognitive communication deficit: Secondary | ICD-10-CM | POA: Diagnosis not present

## 2017-11-02 DIAGNOSIS — M6281 Muscle weakness (generalized): Secondary | ICD-10-CM | POA: Diagnosis not present

## 2017-11-07 DIAGNOSIS — M6281 Muscle weakness (generalized): Secondary | ICD-10-CM | POA: Diagnosis not present

## 2017-11-07 DIAGNOSIS — R41841 Cognitive communication deficit: Secondary | ICD-10-CM | POA: Diagnosis not present

## 2017-11-09 DIAGNOSIS — R41841 Cognitive communication deficit: Secondary | ICD-10-CM | POA: Diagnosis not present

## 2017-11-09 DIAGNOSIS — M6281 Muscle weakness (generalized): Secondary | ICD-10-CM | POA: Diagnosis not present

## 2017-11-14 DIAGNOSIS — I10 Essential (primary) hypertension: Secondary | ICD-10-CM | POA: Diagnosis not present

## 2017-11-14 DIAGNOSIS — M1991 Primary osteoarthritis, unspecified site: Secondary | ICD-10-CM | POA: Diagnosis not present

## 2017-11-14 DIAGNOSIS — I69351 Hemiplegia and hemiparesis following cerebral infarction affecting right dominant side: Secondary | ICD-10-CM | POA: Diagnosis not present

## 2017-11-14 DIAGNOSIS — M545 Low back pain: Secondary | ICD-10-CM | POA: Diagnosis not present

## 2017-11-16 DIAGNOSIS — M6281 Muscle weakness (generalized): Secondary | ICD-10-CM | POA: Diagnosis not present

## 2017-11-16 DIAGNOSIS — R41841 Cognitive communication deficit: Secondary | ICD-10-CM | POA: Diagnosis not present

## 2017-11-20 DIAGNOSIS — M130 Polyarthritis, unspecified: Secondary | ICD-10-CM | POA: Diagnosis not present

## 2017-11-20 DIAGNOSIS — M48062 Spinal stenosis, lumbar region with neurogenic claudication: Secondary | ICD-10-CM | POA: Diagnosis not present

## 2017-11-20 DIAGNOSIS — M545 Low back pain: Secondary | ICD-10-CM | POA: Diagnosis not present

## 2017-11-20 DIAGNOSIS — M542 Cervicalgia: Secondary | ICD-10-CM | POA: Diagnosis not present

## 2017-11-21 DIAGNOSIS — M6281 Muscle weakness (generalized): Secondary | ICD-10-CM | POA: Diagnosis not present

## 2017-11-21 DIAGNOSIS — R41841 Cognitive communication deficit: Secondary | ICD-10-CM | POA: Diagnosis not present

## 2017-11-23 DIAGNOSIS — R41841 Cognitive communication deficit: Secondary | ICD-10-CM | POA: Diagnosis not present

## 2017-11-23 DIAGNOSIS — M6281 Muscle weakness (generalized): Secondary | ICD-10-CM | POA: Diagnosis not present

## 2017-11-26 DIAGNOSIS — M6281 Muscle weakness (generalized): Secondary | ICD-10-CM | POA: Diagnosis not present

## 2017-11-26 DIAGNOSIS — R41841 Cognitive communication deficit: Secondary | ICD-10-CM | POA: Diagnosis not present

## 2017-11-28 DIAGNOSIS — R41841 Cognitive communication deficit: Secondary | ICD-10-CM | POA: Diagnosis not present

## 2017-11-28 DIAGNOSIS — M6281 Muscle weakness (generalized): Secondary | ICD-10-CM | POA: Diagnosis not present

## 2017-12-06 DIAGNOSIS — M545 Low back pain: Secondary | ICD-10-CM | POA: Diagnosis not present

## 2017-12-06 DIAGNOSIS — M461 Sacroiliitis, not elsewhere classified: Secondary | ICD-10-CM | POA: Diagnosis not present

## 2017-12-12 DIAGNOSIS — R41841 Cognitive communication deficit: Secondary | ICD-10-CM | POA: Diagnosis not present

## 2017-12-12 DIAGNOSIS — M6281 Muscle weakness (generalized): Secondary | ICD-10-CM | POA: Diagnosis not present

## 2017-12-14 DIAGNOSIS — R41841 Cognitive communication deficit: Secondary | ICD-10-CM | POA: Diagnosis not present

## 2017-12-14 DIAGNOSIS — M6281 Muscle weakness (generalized): Secondary | ICD-10-CM | POA: Diagnosis not present

## 2017-12-17 DIAGNOSIS — M6281 Muscle weakness (generalized): Secondary | ICD-10-CM | POA: Diagnosis not present

## 2017-12-17 DIAGNOSIS — R41841 Cognitive communication deficit: Secondary | ICD-10-CM | POA: Diagnosis not present

## 2017-12-19 DIAGNOSIS — M6281 Muscle weakness (generalized): Secondary | ICD-10-CM | POA: Diagnosis not present

## 2017-12-19 DIAGNOSIS — R41841 Cognitive communication deficit: Secondary | ICD-10-CM | POA: Diagnosis not present

## 2017-12-24 DIAGNOSIS — M6281 Muscle weakness (generalized): Secondary | ICD-10-CM | POA: Diagnosis not present

## 2017-12-24 DIAGNOSIS — R41841 Cognitive communication deficit: Secondary | ICD-10-CM | POA: Diagnosis not present

## 2017-12-27 DIAGNOSIS — R41841 Cognitive communication deficit: Secondary | ICD-10-CM | POA: Diagnosis not present

## 2017-12-27 DIAGNOSIS — M6281 Muscle weakness (generalized): Secondary | ICD-10-CM | POA: Diagnosis not present

## 2018-01-01 DIAGNOSIS — R41841 Cognitive communication deficit: Secondary | ICD-10-CM | POA: Diagnosis not present

## 2018-01-01 DIAGNOSIS — M6281 Muscle weakness (generalized): Secondary | ICD-10-CM | POA: Diagnosis not present

## 2018-01-03 DIAGNOSIS — M6281 Muscle weakness (generalized): Secondary | ICD-10-CM | POA: Diagnosis not present

## 2018-01-03 DIAGNOSIS — R41841 Cognitive communication deficit: Secondary | ICD-10-CM | POA: Diagnosis not present

## 2018-01-08 DIAGNOSIS — R41841 Cognitive communication deficit: Secondary | ICD-10-CM | POA: Diagnosis not present

## 2018-01-08 DIAGNOSIS — M6281 Muscle weakness (generalized): Secondary | ICD-10-CM | POA: Diagnosis not present

## 2018-01-10 DIAGNOSIS — M6281 Muscle weakness (generalized): Secondary | ICD-10-CM | POA: Diagnosis not present

## 2018-01-10 DIAGNOSIS — R41841 Cognitive communication deficit: Secondary | ICD-10-CM | POA: Diagnosis not present

## 2018-01-15 DIAGNOSIS — M6281 Muscle weakness (generalized): Secondary | ICD-10-CM | POA: Diagnosis not present

## 2018-01-15 DIAGNOSIS — R41841 Cognitive communication deficit: Secondary | ICD-10-CM | POA: Diagnosis not present

## 2018-01-17 DIAGNOSIS — R41841 Cognitive communication deficit: Secondary | ICD-10-CM | POA: Diagnosis not present

## 2018-01-17 DIAGNOSIS — M6281 Muscle weakness (generalized): Secondary | ICD-10-CM | POA: Diagnosis not present

## 2018-01-18 DIAGNOSIS — Z79891 Long term (current) use of opiate analgesic: Secondary | ICD-10-CM | POA: Diagnosis not present

## 2018-01-18 DIAGNOSIS — M130 Polyarthritis, unspecified: Secondary | ICD-10-CM | POA: Diagnosis not present

## 2018-01-18 DIAGNOSIS — M542 Cervicalgia: Secondary | ICD-10-CM | POA: Diagnosis not present

## 2018-01-18 DIAGNOSIS — G894 Chronic pain syndrome: Secondary | ICD-10-CM | POA: Diagnosis not present

## 2018-01-18 DIAGNOSIS — M545 Low back pain: Secondary | ICD-10-CM | POA: Diagnosis not present

## 2018-01-18 DIAGNOSIS — M48062 Spinal stenosis, lumbar region with neurogenic claudication: Secondary | ICD-10-CM | POA: Diagnosis not present

## 2018-01-21 DIAGNOSIS — E785 Hyperlipidemia, unspecified: Secondary | ICD-10-CM | POA: Diagnosis not present

## 2018-01-21 DIAGNOSIS — R4182 Altered mental status, unspecified: Secondary | ICD-10-CM | POA: Diagnosis not present

## 2018-01-21 DIAGNOSIS — Z8673 Personal history of transient ischemic attack (TIA), and cerebral infarction without residual deficits: Secondary | ICD-10-CM | POA: Diagnosis not present

## 2018-01-21 DIAGNOSIS — F329 Major depressive disorder, single episode, unspecified: Secondary | ICD-10-CM | POA: Diagnosis not present

## 2018-01-21 DIAGNOSIS — Z7982 Long term (current) use of aspirin: Secondary | ICD-10-CM | POA: Diagnosis not present

## 2018-01-21 DIAGNOSIS — R531 Weakness: Secondary | ICD-10-CM | POA: Diagnosis not present

## 2018-01-21 DIAGNOSIS — R410 Disorientation, unspecified: Secondary | ICD-10-CM | POA: Diagnosis not present

## 2018-01-21 DIAGNOSIS — F1910 Other psychoactive substance abuse, uncomplicated: Secondary | ICD-10-CM | POA: Diagnosis not present

## 2018-01-21 DIAGNOSIS — F419 Anxiety disorder, unspecified: Secondary | ICD-10-CM | POA: Diagnosis not present

## 2018-01-21 DIAGNOSIS — Z79899 Other long term (current) drug therapy: Secondary | ICD-10-CM | POA: Diagnosis not present

## 2018-01-21 DIAGNOSIS — I10 Essential (primary) hypertension: Secondary | ICD-10-CM | POA: Diagnosis not present

## 2018-01-23 DIAGNOSIS — I10 Essential (primary) hypertension: Secondary | ICD-10-CM | POA: Diagnosis not present

## 2018-01-23 DIAGNOSIS — I69351 Hemiplegia and hemiparesis following cerebral infarction affecting right dominant side: Secondary | ICD-10-CM | POA: Diagnosis not present

## 2018-01-23 DIAGNOSIS — M545 Low back pain: Secondary | ICD-10-CM | POA: Diagnosis not present

## 2018-01-25 DIAGNOSIS — R410 Disorientation, unspecified: Secondary | ICD-10-CM | POA: Diagnosis not present

## 2018-01-25 DIAGNOSIS — R300 Dysuria: Secondary | ICD-10-CM | POA: Diagnosis not present

## 2018-01-29 DIAGNOSIS — M6281 Muscle weakness (generalized): Secondary | ICD-10-CM | POA: Diagnosis not present

## 2018-01-29 DIAGNOSIS — R41841 Cognitive communication deficit: Secondary | ICD-10-CM | POA: Diagnosis not present

## 2018-01-31 DIAGNOSIS — M6281 Muscle weakness (generalized): Secondary | ICD-10-CM | POA: Diagnosis not present

## 2018-01-31 DIAGNOSIS — R41841 Cognitive communication deficit: Secondary | ICD-10-CM | POA: Diagnosis not present

## 2018-02-05 DIAGNOSIS — M6281 Muscle weakness (generalized): Secondary | ICD-10-CM | POA: Diagnosis not present

## 2018-02-05 DIAGNOSIS — R41841 Cognitive communication deficit: Secondary | ICD-10-CM | POA: Diagnosis not present

## 2018-02-07 DIAGNOSIS — M6281 Muscle weakness (generalized): Secondary | ICD-10-CM | POA: Diagnosis not present

## 2018-02-07 DIAGNOSIS — R41841 Cognitive communication deficit: Secondary | ICD-10-CM | POA: Diagnosis not present

## 2018-02-12 DIAGNOSIS — I6990 Unspecified sequelae of unspecified cerebrovascular disease: Secondary | ICD-10-CM | POA: Diagnosis not present

## 2018-02-12 DIAGNOSIS — M1990 Unspecified osteoarthritis, unspecified site: Secondary | ICD-10-CM | POA: Diagnosis not present

## 2018-02-12 DIAGNOSIS — I10 Essential (primary) hypertension: Secondary | ICD-10-CM | POA: Diagnosis not present

## 2018-02-12 DIAGNOSIS — I69351 Hemiplegia and hemiparesis following cerebral infarction affecting right dominant side: Secondary | ICD-10-CM | POA: Diagnosis not present

## 2018-02-14 DIAGNOSIS — Z8673 Personal history of transient ischemic attack (TIA), and cerebral infarction without residual deficits: Secondary | ICD-10-CM | POA: Diagnosis not present

## 2018-02-14 DIAGNOSIS — R41841 Cognitive communication deficit: Secondary | ICD-10-CM | POA: Diagnosis not present

## 2018-02-15 DIAGNOSIS — R41841 Cognitive communication deficit: Secondary | ICD-10-CM | POA: Diagnosis not present

## 2018-02-15 DIAGNOSIS — Z8673 Personal history of transient ischemic attack (TIA), and cerebral infarction without residual deficits: Secondary | ICD-10-CM | POA: Diagnosis not present

## 2018-02-19 DIAGNOSIS — R41841 Cognitive communication deficit: Secondary | ICD-10-CM | POA: Diagnosis not present

## 2018-02-19 DIAGNOSIS — Z8673 Personal history of transient ischemic attack (TIA), and cerebral infarction without residual deficits: Secondary | ICD-10-CM | POA: Diagnosis not present

## 2018-02-21 DIAGNOSIS — R41841 Cognitive communication deficit: Secondary | ICD-10-CM | POA: Diagnosis not present

## 2018-02-21 DIAGNOSIS — Z8673 Personal history of transient ischemic attack (TIA), and cerebral infarction without residual deficits: Secondary | ICD-10-CM | POA: Diagnosis not present

## 2018-03-01 DIAGNOSIS — Z8673 Personal history of transient ischemic attack (TIA), and cerebral infarction without residual deficits: Secondary | ICD-10-CM | POA: Diagnosis not present

## 2018-03-01 DIAGNOSIS — R41841 Cognitive communication deficit: Secondary | ICD-10-CM | POA: Diagnosis not present

## 2018-03-05 DIAGNOSIS — M545 Low back pain: Secondary | ICD-10-CM | POA: Diagnosis not present

## 2018-03-05 DIAGNOSIS — F410 Panic disorder [episodic paroxysmal anxiety] without agoraphobia: Secondary | ICD-10-CM | POA: Diagnosis not present

## 2018-03-05 DIAGNOSIS — G4700 Insomnia, unspecified: Secondary | ICD-10-CM | POA: Diagnosis not present

## 2018-03-05 DIAGNOSIS — I69351 Hemiplegia and hemiparesis following cerebral infarction affecting right dominant side: Secondary | ICD-10-CM | POA: Diagnosis not present

## 2018-03-07 DIAGNOSIS — R41841 Cognitive communication deficit: Secondary | ICD-10-CM | POA: Diagnosis not present

## 2018-03-07 DIAGNOSIS — Z8673 Personal history of transient ischemic attack (TIA), and cerebral infarction without residual deficits: Secondary | ICD-10-CM | POA: Diagnosis not present

## 2018-06-24 DIAGNOSIS — M545 Low back pain: Secondary | ICD-10-CM | POA: Diagnosis not present

## 2018-06-24 DIAGNOSIS — M48062 Spinal stenosis, lumbar region with neurogenic claudication: Secondary | ICD-10-CM | POA: Diagnosis not present

## 2018-06-24 DIAGNOSIS — R4182 Altered mental status, unspecified: Secondary | ICD-10-CM | POA: Diagnosis not present

## 2018-06-24 DIAGNOSIS — M542 Cervicalgia: Secondary | ICD-10-CM | POA: Diagnosis not present

## 2018-06-24 DIAGNOSIS — Z79899 Other long term (current) drug therapy: Secondary | ICD-10-CM | POA: Diagnosis not present

## 2018-06-24 DIAGNOSIS — G894 Chronic pain syndrome: Secondary | ICD-10-CM | POA: Diagnosis not present

## 2018-06-24 DIAGNOSIS — M130 Polyarthritis, unspecified: Secondary | ICD-10-CM | POA: Diagnosis not present

## 2018-06-27 DIAGNOSIS — M130 Polyarthritis, unspecified: Secondary | ICD-10-CM | POA: Diagnosis not present

## 2018-06-27 DIAGNOSIS — M545 Low back pain: Secondary | ICD-10-CM | POA: Diagnosis not present

## 2018-06-27 DIAGNOSIS — G894 Chronic pain syndrome: Secondary | ICD-10-CM | POA: Diagnosis not present

## 2018-06-27 DIAGNOSIS — Z79899 Other long term (current) drug therapy: Secondary | ICD-10-CM | POA: Diagnosis not present

## 2018-07-17 IMAGING — MR MR LUMBAR SPINE W/O CM
4 of 5 series · 11 of 48 positions shown · non-contrast
Comparison: Prior radiograph from 08/27/2016.

CLINICAL DATA: Initial evaluation for chronic low back pain with
bilateral leg weakness.

EXAM:
MRI LUMBAR SPINE WITHOUT CONTRAST
TECHNIQUE: Multiplanar, multisequence MR imaging of the lumbar spine was
performed. No intravenous contrast was administered.

[Series 3: T2 · sagittal · 4.0mm · 0.45mm/px · 3 of 15 slices shown (1 of 3)]
[im 1/15]
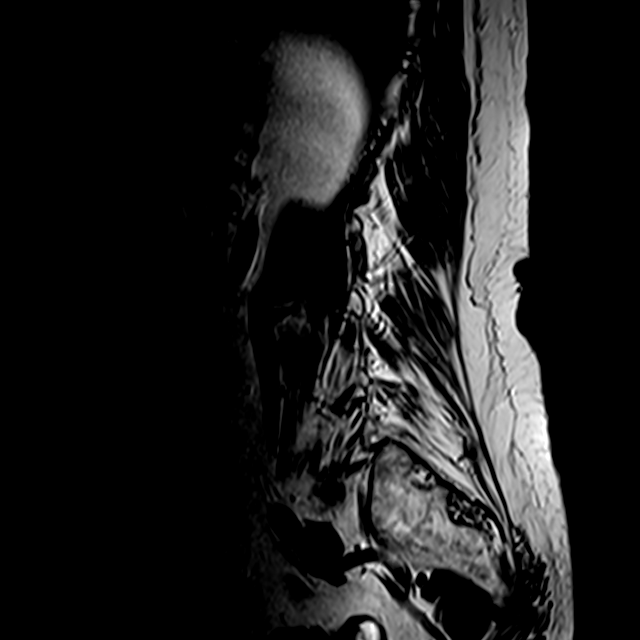
[im 10/15]
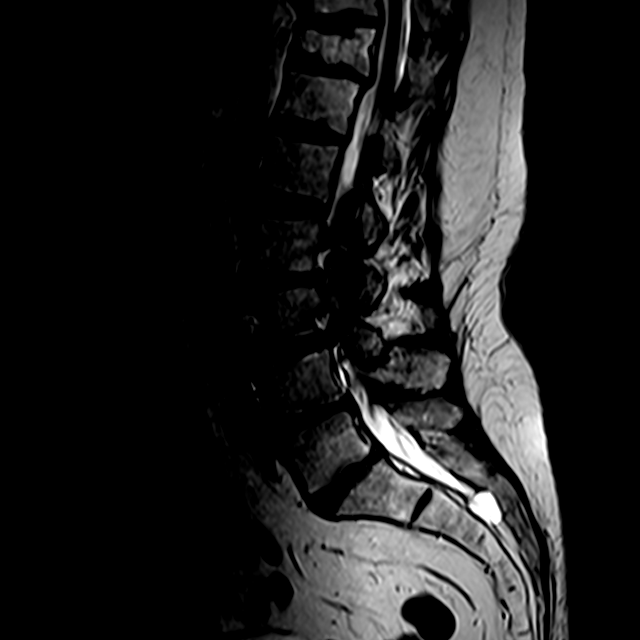
[im 15/15]
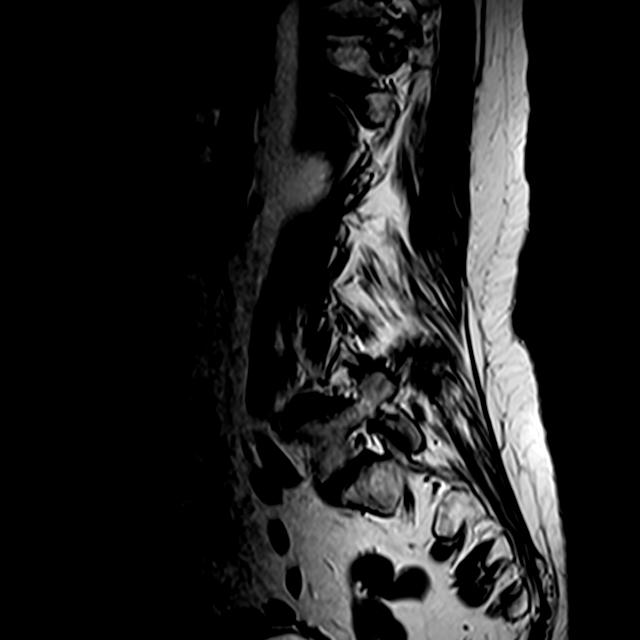

[Series 4: T1 · sagittal · 4.0mm · 0.45mm/px · 2 of 15 slices shown]
[im 1/15]
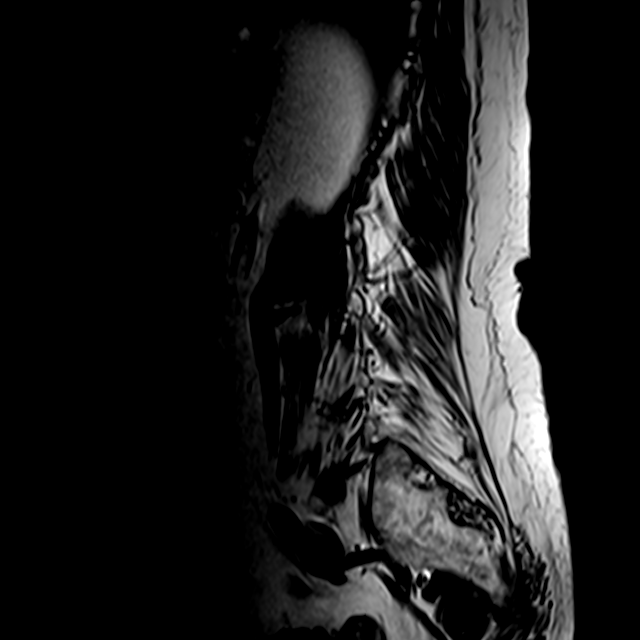
[im 8/15]
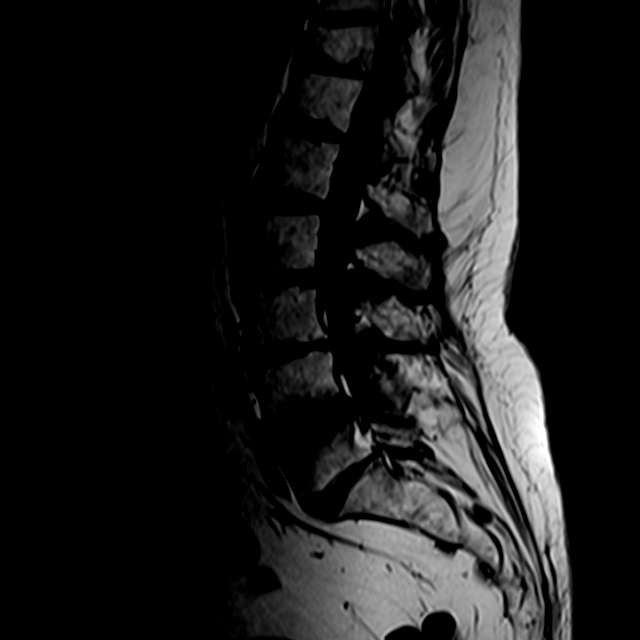

[Series 5: T2 · sagittal · 4.0mm · 0.45mm/px · 3 of 20 slices shown (2 of 3)]
[im 4/20]
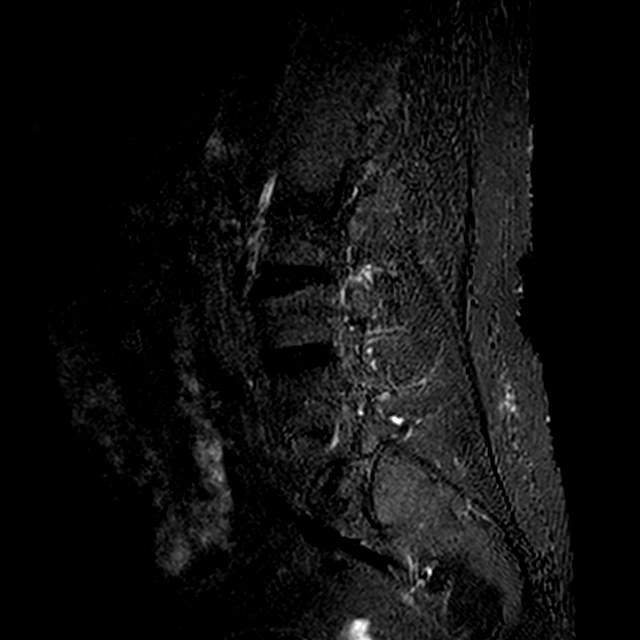
[im 10/20]
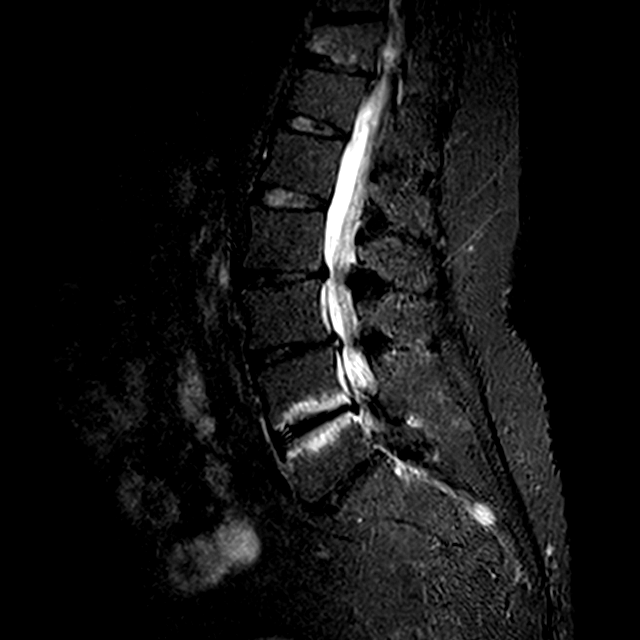
[im 16/20]
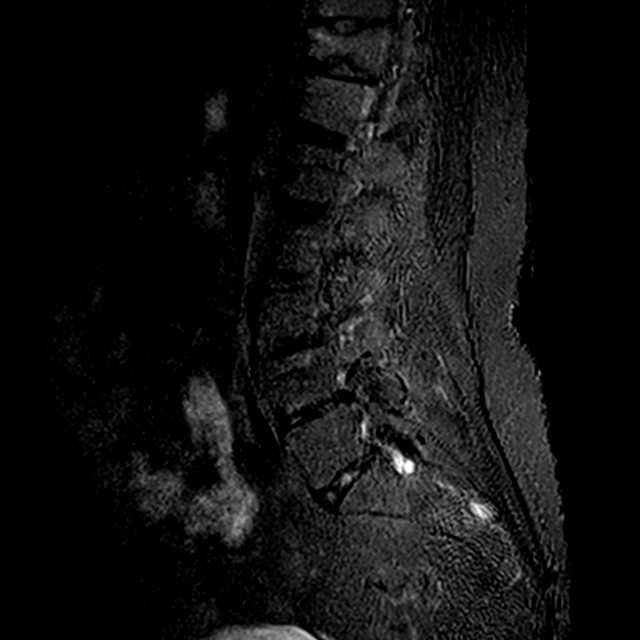

[Series 6: T2 · axial · 4.0mm · 0.28mm/px · z∈[-66,+131]mm · 3 of 47 slices shown (3 of 3)]
[im 7/47]
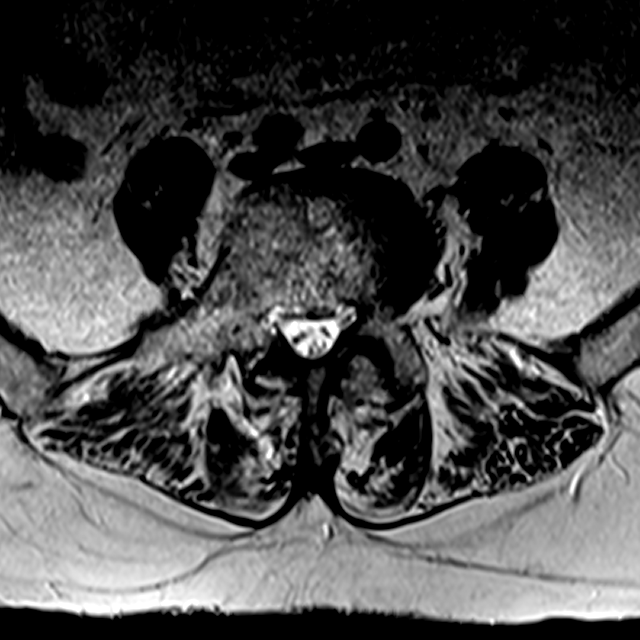
[im 25/47]
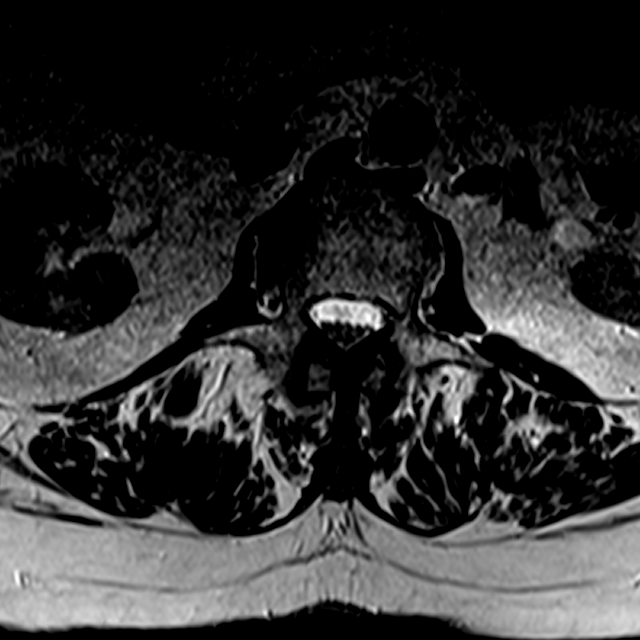
[im 40/47]
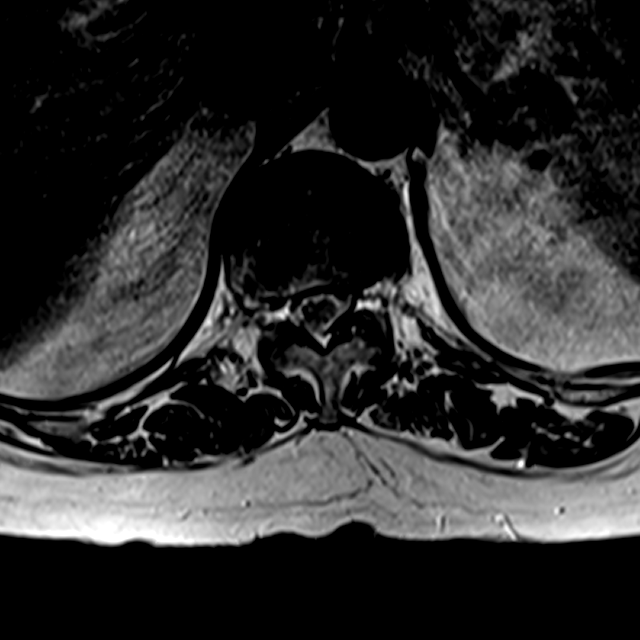

[11 of 48 positions shown; findings below may reference images not displayed]

FINDINGS: Segmentation: Normal segmentation. Lowest well-formed disc is
labeled the L5-S1 level.

Alignment: Levoscoliosis, stable from previous. Vertebral bodies
otherwise normally aligned with preservation of the normal lumbar
lordosis.

Vertebrae: Anterior wedging deformity of the T11 vertebral body with
approximately 40-50% height loss and minimal 3 mm bony retropulsion.
Minimal subtle STIR signal intensity within the inferior endplate of
T11 suspected to in large part reflect reactive endplate changes,
although a resolving subacute component to the compression fracture
not entirely excluded.

Vertebral body heights otherwise maintained. No other acute or
chronic fracture. Prominent reactive endplate changes noted about
the L4-5 interspace. Signal intensity within the vertebral body bone
marrow otherwise normal. No discrete or worrisome osseous lesions.

Conus medullaris: Extends to the L1 level and appears normal.

Paraspinal and other soft tissues: Paraspinous soft tissues within
normal limits. T2 hyperintense cyst noted within the right kidney.
Visualized vessel structures otherwise unremarkable.

Disc levels:

T11-12: Diffuse degenerative disc bulge with disc desiccation and
intervertebral disc space narrowing. Mild 3 mm bony retropulsion
related to the T11 compression fracture. Facet and ligamentum flavum
hypertrophy. No significant stenosis.

T12-L1: Normal interspace. Mild facet and ligamentum flavum
hypertrophy. No stenosis.

L1-2: Normal interspace. Facet and ligamentum flavum hypertrophy. No
significant stenosis.

L2-3: Chronic diffuse degenerative disc bulge with disc desiccation
and intervertebral disc space narrowing. Prominent bilateral facet
arthrosis with ligamentum flavum hypertrophy. Resultant moderate to
severe canal and bilateral subarticular stenosis. Thecal sac
measures 9 mm in AP diameter. Mild right foraminal stenosis. No
significant left foraminal encroachment.

L3-4: Chronic diffuse degenerative intervertebral disc space
narrowing with disc desiccation and diffuse disc bulge. Disc bulging
slightly centric to the right without focal disc protrusion. Right
worse than left facet arthrosis with ligamentum flavum hypertrophy.
Resultant severe canal and bilateral lateral recess narrowing, right
worse than left. Thecal sac measures 6 mm in AP diameter. Mild right
foraminal stenosis. No significant left foraminal encroachment.

L4-5: Chronic diffuse degenerative intervertebral disc space
narrowing with disc desiccation and diffuse disc bulge. Prominent
reactive endplate changes, greater on the left. The bilateral facet
arthrosis with ligamentum flavum hypertrophy. Resultant moderate
canal stenosis. Moderate to severe left subarticular stenosis. Mild
bilateral foraminal narrowing, left worse than right.

L5-S1: Chronic diffuse degenerative intervertebral disc space
narrowing with disc desiccation and mild diffuse disc bulge. Left
far lateral/ extraforaminal reactive endplate changes with endplate
osteophytic spurring. Mild bilateral facet arthrosis, slightly worse
on the left. No significant canal stenosis. Mild left foraminal
narrowing.
IMPRESSION: 1. Moderate multilevel degenerative spondylolysis and facet
arthrosis as detailed above. Resultant moderate to severe canal and
bilateral subarticular stenosis at L2-3 and L3-4 (most severe at
L3-4).
2. Chronic degenerative disc bulge and facet arthrosis at L4-5 with
resultant moderate canal and moderate to severe left subarticular
stenosis.
3. Compression deformity of the T11 vertebral body with 40-50%
height loss and trace 3 mm bony retropulsion. While this is largely
chronic in appearance, faint edema within the T11 vertebral body may
reflect reactive endplate changes and/or a superimposed subacute
component.
4. Levoscoliosis.

## 2018-08-19 DIAGNOSIS — M130 Polyarthritis, unspecified: Secondary | ICD-10-CM | POA: Diagnosis not present

## 2018-08-19 DIAGNOSIS — M545 Low back pain: Secondary | ICD-10-CM | POA: Diagnosis not present

## 2018-08-19 DIAGNOSIS — M542 Cervicalgia: Secondary | ICD-10-CM | POA: Diagnosis not present

## 2018-08-19 DIAGNOSIS — M48062 Spinal stenosis, lumbar region with neurogenic claudication: Secondary | ICD-10-CM | POA: Diagnosis not present

## 2018-09-07 ENCOUNTER — Encounter (HOSPITAL_COMMUNITY): Payer: Self-pay | Admitting: Emergency Medicine

## 2018-09-07 ENCOUNTER — Other Ambulatory Visit: Payer: Self-pay

## 2018-09-07 ENCOUNTER — Emergency Department (HOSPITAL_COMMUNITY)
Admission: EM | Admit: 2018-09-07 | Discharge: 2018-09-07 | Disposition: A | Discharge status: Home / Self Care | Payer: Medicare PPO | Attending: Emergency Medicine | Admitting: Emergency Medicine

## 2018-09-07 ENCOUNTER — Emergency Department (HOSPITAL_COMMUNITY): Payer: Medicare PPO

## 2018-09-07 DIAGNOSIS — Z8673 Personal history of transient ischemic attack (TIA), and cerebral infarction without residual deficits: Secondary | ICD-10-CM | POA: Insufficient documentation

## 2018-09-07 DIAGNOSIS — Z79899 Other long term (current) drug therapy: Secondary | ICD-10-CM | POA: Diagnosis not present

## 2018-09-07 DIAGNOSIS — M545 Low back pain, unspecified: Secondary | ICD-10-CM

## 2018-09-07 DIAGNOSIS — I10 Essential (primary) hypertension: Secondary | ICD-10-CM | POA: Diagnosis not present

## 2018-09-07 DIAGNOSIS — G8929 Other chronic pain: Secondary | ICD-10-CM | POA: Diagnosis not present

## 2018-09-07 HISTORY — DX: Cerebral infarction, unspecified: I63.9

## 2018-09-07 HISTORY — DX: Pelvic and perineal pain: R10.2

## 2018-09-07 HISTORY — DX: Paroxysmal atrial fibrillation: I48.0

## 2018-09-07 HISTORY — DX: Other chronic pain: G89.29

## 2018-09-07 HISTORY — DX: Unspecified convulsions: R56.9

## 2018-09-07 HISTORY — DX: Pain in unspecified knee: M25.569

## 2018-09-07 HISTORY — DX: Wedge compression fracture of T11-T12 vertebra, initial encounter for closed fracture: S22.080A

## 2018-09-07 HISTORY — DX: Spinal stenosis, lumbar region without neurogenic claudication: M48.061

## 2018-09-07 HISTORY — DX: Dorsalgia, unspecified: M54.9

## 2018-09-07 MED ORDER — ACETAMINOPHEN 325 MG PO TABS
650.0000 mg | ORAL_TABLET | Freq: Once | ORAL | Status: AC
  Administered 2018-09-07: 650 mg via ORAL
  Filled 2018-09-07: qty 2

## 2018-09-07 MED ORDER — METHOCARBAMOL 500 MG PO TABS
500.0000 mg | ORAL_TABLET | Freq: Once | ORAL | Status: AC
  Administered 2018-09-07: 500 mg via ORAL
  Filled 2018-09-07: qty 1

## 2018-09-07 MED ORDER — METHOCARBAMOL 500 MG PO TABS: 500.0000 mg | ORAL_TABLET | Freq: Four times a day (QID) | ORAL | 0 refills | Status: DC | PRN

## 2018-09-07 MED ORDER — PREDNISONE 10 MG (21) PO TBPK: ORAL_TABLET | Freq: Every day | ORAL | 0 refills | Status: DC

## 2018-09-07 NOTE — ED Triage Notes (Signed)
Pt presents with back pain x 2 days with hx of chronic back problems. Pt states "hurting all over", reports pain worse when she moves her legs, pain radiates down bilateral legs. Not taking any pain medication.

## 2018-09-07 NOTE — Discharge Instructions (Signed)
Take the prescriptions as directed.  Apply moist heat or ice to the area(s) of discomfort, for 15 minutes at a time, several times per day for the next few days.  Do not fall asleep on a heating or ice pack.  Call your regular medical doctor and your Pain Management doctor on Monday to schedule a follow up appointment this week.  Return to the Emergency Department immediately if worsening.

## 2018-09-07 NOTE — ED Provider Notes (Signed)
[facility] EMERGENCY DEPARTMENT Provider Note   CSN: [id number] Arrival date & time: 09/07/18  1304     History   Chief Complaint Chief Complaint  Patient presents with  . Back Pain    HPI [name] is a 67 y.o. female.  HPI  Pt was seen at 1340. Per pt, c/o gradual onset and persistence of constant acute flair of her chronic low back "pain" for the past 3 days.  Pain began after she was "folding and hanging laundry." Denies any change in her usual chronic pain pattern.  Pain worsens with palpation of the area and body position changes. Pt has not taken any medication to treat her symptoms. Denies incont/retention of bowel or bladder, no saddle anesthesia, no focal motor weakness, no tingling/numbness in extremities, no fevers, no direct injury, no abd pain.   The symptoms have been associated with no other complaints.    Past Medical History:  Diagnosis Date  . Anxiety   . Arthritis   . Chronic back pain    radiating to bilat legs/weakness  . Chronic knee pain   . Compression fracture of T11 vertebra ([place])   . Depression   . Hypertension   . PAF (paroxysmal atrial fibrillation) ([place])   . Panic attacks   . Pelvic pain   . Seizures ([employer])   . Spinal stenosis, lumbar   . Stroke [facility])     Patient Active Problem List   Diagnosis Date Noted  . Pelvic pain in female 11/19/2015  . Diarrhea 11/01/2015  . Arthritis of knee 06/24/2013  . HYPERKALEMIA 07/07/2009  . HYPERTENSION, UNSPECIFIED 07/07/2009  . BRADYCARDIA 07/07/2009  . DIASTOLIC HEART FAILURE, CHRONIC 07/07/2009  . DYSPNEA 07/07/2009    Past Surgical History:  Procedure Laterality Date  . BIOPSY N/A 12/02/2015   Procedure: BIOPSY;  Surgeon: Rogene Houston, MD;  Location: [facility] ENDO SUITE;  Service: Endoscopy;  Laterality: N/A;  Random colon biopsies  . CHOLECYSTECTOMY    . COLONOSCOPY    . COLONOSCOPY N/A 12/02/2015   Procedure: COLONOSCOPY;  Surgeon: Rogene Houston, MD;  Location: [facility] ENDO SUITE;  Service:  Endoscopy;  Laterality: N/A;  110  . EYE SURGERY     as a child  . GASTRIC BYPASS     in 1985 for obesity  . POLYPECTOMY N/A 12/02/2015   Procedure: POLYPECTOMY;  Surgeon: Rogene Houston, MD;  Location: [facility] ENDO SUITE;  Service: Endoscopy;  Laterality: N/A;  Splenic flexure polyp  . right foot surgery       OB History   None      Home Medications    Prior to Admission medications   Medication Sig Start Date End Date Taking? Authorizing Provider  ALPRAZolam Duanne Moron) 1 MG tablet Take 1 mg by mouth 4 (four) times daily as needed for anxiety.    [provider]  Aspirin-Acetaminophen-Caffeine (GOODY HEADACHE PO) Take 1 packet by mouth daily as needed (pain). Reported on 01/31/2016    [provider]  dicyclomine (BENTYL) 10 MG capsule Take 1 capsule (10 mg total) by mouth 3 (three) times daily before meals. 01/31/16   Setzer, Rona Ravens, NP  lisinopril (PRINIVIL,ZESTRIL) 20 MG tablet Take 20 mg by mouth. 06/16/16   [provider]  loperamide (IMODIUM) 2 MG capsule Take 1 capsule (2 mg total) by mouth 2 (two) times daily with breakfast and lunch. 12/02/15   Rogene Houston, MD  oxyCODONE-acetaminophen (PERCOCET) 10-325 MG tablet Take 1 tablet by mouth every  6 (six) hours as needed for pain.    [provider]  PARoxetine (PAXIL) 20 MG tablet Take 20 mg by mouth daily.    [provider]  trazodone (DESYREL) 300 MG tablet Take 300 mg by mouth at bedtime. Reported on 11/01/2015    [provider]  Wheat Dextrin (BENEFIBER DRINK MIX) PACK Take 4 g by mouth at bedtime. 12/02/15   Rogene Houston, MD    Family History No family history on file.  Social History Social History   Tobacco Use  . Smoking status: Never Smoker  . Smokeless tobacco: Never Used  Substance Use Topics  . Alcohol use: No    Alcohol/week: 0.0 standard drinks  . Drug use: No     Allergies   Patient has no known allergies.   Review of Systems Review of  Systems ROS: Statement: All systems negative except as marked or noted in the HPI; Constitutional: Negative for fever and chills. ; ; Eyes: Negative for eye pain, redness and discharge. ; ; ENMT: Negative for ear pain, hoarseness, nasal congestion, sinus pressure and sore throat. ; ; Cardiovascular: Negative for chest pain, palpitations, diaphoresis, dyspnea and peripheral edema. ; ; Respiratory: Negative for cough, wheezing and stridor. ; ; Gastrointestinal: Negative for nausea, vomiting, diarrhea, abdominal pain, blood in stool, hematemesis, jaundice and rectal bleeding. . ; ; Genitourinary: Negative for dysuria, flank pain and hematuria. ; ; Musculoskeletal: +LBP. Negative for neck pain. Negative for swelling and trauma.; ; Skin: Negative for pruritus, rash, abrasions, blisters, bruising and skin lesion.; ; Neuro: Negative for headache, lightheadedness and neck stiffness. Negative for weakness, altered level of consciousness, altered mental status, extremity weakness, paresthesias, involuntary movement, seizure and syncope.       Physical Exam Updated Vital Signs BP (!) 141/67 (BP Location: Left Arm)   Pulse 96   Temp 98.1 F (36.7 C) (Oral)   Resp 20   Ht 5\' 7"  (1.702 m)   Wt 113.4 kg   SpO2 98%   BMI 39.16 kg/m   Physical Exam 1345: Physical examination:  Nursing notes reviewed; Vital signs and O2 SAT reviewed;  Constitutional: Well developed, Well nourished, Well hydrated, In no acute distress; Head:  Normocephalic, atraumatic; Eyes: EOMI, PERRL, No scleral icterus; ENMT: Mouth and pharynx normal, Mucous membranes moist; Neck: Supple, Full range of motion, No lymphadenopathy; Cardiovascular: Regular rate and rhythm, No gallop; Respiratory: Breath sounds clear & equal bilaterally, No wheezes.  Speaking full sentences with ease, Normal respiratory effort/excursion; Chest: Nontender, Movement normal; Abdomen: Soft, Nontender, Nondistended, Normal bowel sounds; Genitourinary: No CVA  tenderness; Spine:  No midline CS, TS, LS tenderness. +TTP left lumbar paraspinal muscles;  Extremities: Peripheral pulses normal, No tenderness, No edema, No calf edema or asymmetry.; Neuro: AA&Ox3, Major CN grossly intact.  Speech clear. No gross focal motor or sensory deficits in extremities. Strength 5/5 equal bilat UE's and LE's, including great toe dorsiflexion.  DTR 2/4 equal bilat UE's and LE's.  No gross sensory deficits.  Pt stated her left back "hurt" when I lifted her LLE, othewise neg straight leg raises bilat..; Skin: Color normal, Warm, Dry.   ED Treatments / Results  Labs (all labs ordered are listed, but only abnormal results are displayed)   EKG None  Radiology   Procedures Procedures (including critical care time)  Medications Ordered in ED Medications  acetaminophen (TYLENOL) tablet 650 mg (has no administration in time range)  methocarbamol (ROBAXIN) tablet 500 mg (has no administration in time range)  Initial Impression / Assessment and Plan / ED Course  I have reviewed the triage vital signs and the nursing notes.  Pertinent labs & imaging results that were available during my care of the patient were reviewed by me and considered in my medical decision making (see chart for details).   MDM Reviewed: previous chart, nursing note and vitals Reviewed previous: MRI Interpretation: CT scan   Ct Lumbar Spine Wo Contrast Result Date: 09/07/2018 CLINICAL DATA:  Back pain for 2 days. Chronic back pain. No reported acute injury. EXAM: CT LUMBAR SPINE WITHOUT CONTRAST TECHNIQUE: Multidetector CT imaging of the lumbar spine was performed without intravenous contrast administration. Multiplanar CT image reconstructions were also generated. COMPARISON:  Lumbar MRI 02/27/2017. FINDINGS: Segmentation: Normal segmentation. Alignment: Mild convex left scoliosis centered at L3, similar to previous study. The lateral alignment is normal. Vertebrae: The T11 vertebral body  is only partially imaged and appears grossly stable. No evidence of acute lumbar spine fracture. There is multilevel spondylosis with advanced endplate sclerosis and vacuum phenomenon at L4-5. Mild sacroiliac degenerative changes bilaterally. Paraspinal and other soft tissues: No acute paraspinal abnormalities. Mild aortoiliac atherosclerosis. Disc levels: There is stable mild disc and endplate degeneration at T11-12. The T12-L1 and L1-2 discs appear normal. There is stable mild facet and ligamentous hypertrophy at those levels. L2-3: Disc degeneration with vacuum phenomenon, annular disc bulging and endplate osteophytes asymmetric to the right. There is moderate facet and ligamentous hypertrophy. These factors contribute to moderate multifactorial spinal stenosis which appears similar to the previous MRI. There is mild right foraminal narrowing. L3-4: Disc degeneration with vacuum phenomenon, annular disc bulging and endplate osteophytes asymmetric to the right. There is advanced facet and ligamentous hypertrophy, also worse on the right. These factors contribute to severe spinal stenosis with asymmetric moderate narrowing of the right lateral recess and right foramen. Overall appearance is similar to previous MRI. L4-5: Disc degeneration with vacuum phenomenon and marked loss of disc height. Endplate sclerosis and osteophytes are probably mildly progressive. There is advanced facet and ligamentous hypertrophy. There is moderate spinal stenosis and moderate left greater than right foraminal narrowing. L5-S1: Stable mild disc bulging with asymmetric left-sided facet hypertrophy. Mild left foraminal narrowing. IMPRESSION: 1. No acute findings identified. 2. Compared with the lumbar MRI from 02/27/2017, disc and endplate degeneration appears progressive at L4-5. 3. Multilevel spondylosis with disc degeneration, vacuum phenomenon and facet hypertrophy, otherwise similar to previous MRI. There is resulting multilevel  spinal stenosis and foraminal narrowing as detailed above. Spinal stenosis appears greatest at L3-4 where it is severe. Electronically Signed   By: Richardean Sale M.D.   On: 09/07/2018 15:14      1350:  Pt's family members x2 in exam room specifically told me NOT to give pt narcotics or benzodiazepines "because it messes with her stroke."  After they left, pt asked me what meds I was going to give her. When I replied a muscle relaxer and tylenol, she said "you can keep your tylenol, I need something stronger." I reminded her that her family did not want her to have narcotics. Pt stated "they don't understand my pain" and "[place] gives me percocet."   9678:  CT without acute surgical issue at this time. Tx symptomatically at this time. Pt continues to ask multiple ED staff for narcotics; reminded that her family asked not to give her narcotics or benzos. Long hx of chronic pain, and pt endorses acute flair of her usual long standing chronic pain today, no change  from her usual chronic pain pattern.  Pt encouraged to f/u with her PMD and Pain Management doctor for good continuity of care and control of her chronic pain.  Pt verb understanding. Dx and testing d/w pt.  Questions answered.  Verb understanding, agreeable to d/c home with outpt f/u.         Final Clinical Impressions(s) / ED Diagnoses   Final diagnoses:  None    ED Discharge Orders    None       Francine Graven, DO 09/12/18 1521

## 2018-09-19 DIAGNOSIS — R569 Unspecified convulsions: Secondary | ICD-10-CM | POA: Diagnosis not present

## 2018-09-23 DIAGNOSIS — I10 Essential (primary) hypertension: Secondary | ICD-10-CM | POA: Diagnosis not present

## 2018-09-23 DIAGNOSIS — I69351 Hemiplegia and hemiparesis following cerebral infarction affecting right dominant side: Secondary | ICD-10-CM | POA: Diagnosis not present

## 2018-09-23 DIAGNOSIS — I6990 Unspecified sequelae of unspecified cerebrovascular disease: Secondary | ICD-10-CM | POA: Diagnosis not present

## 2018-09-23 DIAGNOSIS — M545 Low back pain: Secondary | ICD-10-CM | POA: Diagnosis not present

## 2018-09-23 DIAGNOSIS — Z23 Encounter for immunization: Secondary | ICD-10-CM | POA: Diagnosis not present

## 2018-09-30 DIAGNOSIS — R41841 Cognitive communication deficit: Secondary | ICD-10-CM | POA: Diagnosis not present

## 2018-10-17 DIAGNOSIS — Z8673 Personal history of transient ischemic attack (TIA), and cerebral infarction without residual deficits: Secondary | ICD-10-CM | POA: Diagnosis not present

## 2018-10-24 DIAGNOSIS — Z8673 Personal history of transient ischemic attack (TIA), and cerebral infarction without residual deficits: Secondary | ICD-10-CM | POA: Diagnosis not present

## 2018-10-31 DIAGNOSIS — Z8673 Personal history of transient ischemic attack (TIA), and cerebral infarction without residual deficits: Secondary | ICD-10-CM | POA: Diagnosis not present

## 2018-11-07 DIAGNOSIS — Z8673 Personal history of transient ischemic attack (TIA), and cerebral infarction without residual deficits: Secondary | ICD-10-CM | POA: Diagnosis not present

## 2018-11-14 DIAGNOSIS — Z8673 Personal history of transient ischemic attack (TIA), and cerebral infarction without residual deficits: Secondary | ICD-10-CM | POA: Diagnosis not present

## 2018-11-20 DIAGNOSIS — M545 Low back pain: Secondary | ICD-10-CM | POA: Diagnosis not present

## 2018-11-20 DIAGNOSIS — M130 Polyarthritis, unspecified: Secondary | ICD-10-CM | POA: Diagnosis not present

## 2018-11-20 DIAGNOSIS — M48062 Spinal stenosis, lumbar region with neurogenic claudication: Secondary | ICD-10-CM | POA: Diagnosis not present

## 2018-11-20 DIAGNOSIS — M542 Cervicalgia: Secondary | ICD-10-CM | POA: Diagnosis not present

## 2018-12-19 DIAGNOSIS — Z8673 Personal history of transient ischemic attack (TIA), and cerebral infarction without residual deficits: Secondary | ICD-10-CM | POA: Diagnosis not present

## 2018-12-23 DIAGNOSIS — I69351 Hemiplegia and hemiparesis following cerebral infarction affecting right dominant side: Secondary | ICD-10-CM | POA: Diagnosis not present

## 2018-12-23 DIAGNOSIS — I10 Essential (primary) hypertension: Secondary | ICD-10-CM | POA: Diagnosis not present

## 2018-12-23 DIAGNOSIS — I6990 Unspecified sequelae of unspecified cerebrovascular disease: Secondary | ICD-10-CM | POA: Diagnosis not present

## 2018-12-23 DIAGNOSIS — G4700 Insomnia, unspecified: Secondary | ICD-10-CM | POA: Diagnosis not present

## 2019-02-04 DIAGNOSIS — L03119 Cellulitis of unspecified part of limb: Secondary | ICD-10-CM | POA: Diagnosis not present

## 2019-03-26 DIAGNOSIS — M48062 Spinal stenosis, lumbar region with neurogenic claudication: Secondary | ICD-10-CM | POA: Diagnosis not present

## 2019-03-26 DIAGNOSIS — M130 Polyarthritis, unspecified: Secondary | ICD-10-CM | POA: Diagnosis not present

## 2019-03-26 DIAGNOSIS — M542 Cervicalgia: Secondary | ICD-10-CM | POA: Diagnosis not present

## 2019-03-26 DIAGNOSIS — M545 Low back pain: Secondary | ICD-10-CM | POA: Diagnosis not present

## 2019-04-02 ENCOUNTER — Other Ambulatory Visit (HOSPITAL_COMMUNITY): Payer: Self-pay | Admitting: Pulmonary Disease

## 2019-04-02 DIAGNOSIS — M545 Low back pain, unspecified: Secondary | ICD-10-CM

## 2019-04-02 DIAGNOSIS — I6990 Unspecified sequelae of unspecified cerebrovascular disease: Secondary | ICD-10-CM | POA: Diagnosis not present

## 2019-04-02 DIAGNOSIS — M542 Cervicalgia: Secondary | ICD-10-CM

## 2019-04-02 DIAGNOSIS — I10 Essential (primary) hypertension: Secondary | ICD-10-CM | POA: Diagnosis not present

## 2019-04-02 DIAGNOSIS — I69351 Hemiplegia and hemiparesis following cerebral infarction affecting right dominant side: Secondary | ICD-10-CM | POA: Diagnosis not present

## 2019-04-10 DIAGNOSIS — R262 Difficulty in walking, not elsewhere classified: Secondary | ICD-10-CM | POA: Diagnosis not present

## 2019-04-10 DIAGNOSIS — M545 Low back pain: Secondary | ICD-10-CM | POA: Diagnosis not present

## 2019-04-10 DIAGNOSIS — M79621 Pain in right upper arm: Secondary | ICD-10-CM | POA: Diagnosis not present

## 2019-04-10 DIAGNOSIS — M6281 Muscle weakness (generalized): Secondary | ICD-10-CM | POA: Diagnosis not present

## 2019-04-16 DIAGNOSIS — M545 Low back pain: Secondary | ICD-10-CM | POA: Diagnosis not present

## 2019-04-16 DIAGNOSIS — R262 Difficulty in walking, not elsewhere classified: Secondary | ICD-10-CM | POA: Diagnosis not present

## 2019-04-16 DIAGNOSIS — M6281 Muscle weakness (generalized): Secondary | ICD-10-CM | POA: Diagnosis not present

## 2019-04-16 DIAGNOSIS — M79621 Pain in right upper arm: Secondary | ICD-10-CM | POA: Diagnosis not present

## 2019-04-16 DIAGNOSIS — G3184 Mild cognitive impairment, so stated: Secondary | ICD-10-CM | POA: Diagnosis not present

## 2019-04-17 DIAGNOSIS — M6281 Muscle weakness (generalized): Secondary | ICD-10-CM | POA: Diagnosis not present

## 2019-04-17 DIAGNOSIS — M545 Low back pain: Secondary | ICD-10-CM | POA: Diagnosis not present

## 2019-04-17 DIAGNOSIS — R262 Difficulty in walking, not elsewhere classified: Secondary | ICD-10-CM | POA: Diagnosis not present

## 2019-04-17 DIAGNOSIS — G3184 Mild cognitive impairment, so stated: Secondary | ICD-10-CM | POA: Diagnosis not present

## 2019-04-17 DIAGNOSIS — M79621 Pain in right upper arm: Secondary | ICD-10-CM | POA: Diagnosis not present

## 2019-04-22 DIAGNOSIS — R262 Difficulty in walking, not elsewhere classified: Secondary | ICD-10-CM | POA: Diagnosis not present

## 2019-04-22 DIAGNOSIS — M79621 Pain in right upper arm: Secondary | ICD-10-CM | POA: Diagnosis not present

## 2019-04-22 DIAGNOSIS — M6281 Muscle weakness (generalized): Secondary | ICD-10-CM | POA: Diagnosis not present

## 2019-04-22 DIAGNOSIS — G3184 Mild cognitive impairment, so stated: Secondary | ICD-10-CM | POA: Diagnosis not present

## 2019-04-22 DIAGNOSIS — M545 Low back pain: Secondary | ICD-10-CM | POA: Diagnosis not present

## 2019-04-25 DIAGNOSIS — R262 Difficulty in walking, not elsewhere classified: Secondary | ICD-10-CM | POA: Diagnosis not present

## 2019-04-25 DIAGNOSIS — G3184 Mild cognitive impairment, so stated: Secondary | ICD-10-CM | POA: Diagnosis not present

## 2019-04-25 DIAGNOSIS — M545 Low back pain: Secondary | ICD-10-CM | POA: Diagnosis not present

## 2019-04-25 DIAGNOSIS — M6281 Muscle weakness (generalized): Secondary | ICD-10-CM | POA: Diagnosis not present

## 2019-04-25 DIAGNOSIS — M79621 Pain in right upper arm: Secondary | ICD-10-CM | POA: Diagnosis not present

## 2019-04-29 DIAGNOSIS — M6281 Muscle weakness (generalized): Secondary | ICD-10-CM | POA: Diagnosis not present

## 2019-04-29 DIAGNOSIS — M545 Low back pain: Secondary | ICD-10-CM | POA: Diagnosis not present

## 2019-04-29 DIAGNOSIS — M79621 Pain in right upper arm: Secondary | ICD-10-CM | POA: Diagnosis not present

## 2019-04-29 DIAGNOSIS — R262 Difficulty in walking, not elsewhere classified: Secondary | ICD-10-CM | POA: Diagnosis not present

## 2019-04-29 DIAGNOSIS — G3184 Mild cognitive impairment, so stated: Secondary | ICD-10-CM | POA: Diagnosis not present

## 2019-05-02 DIAGNOSIS — R262 Difficulty in walking, not elsewhere classified: Secondary | ICD-10-CM | POA: Diagnosis not present

## 2019-05-02 DIAGNOSIS — M545 Low back pain: Secondary | ICD-10-CM | POA: Diagnosis not present

## 2019-05-02 DIAGNOSIS — G3184 Mild cognitive impairment, so stated: Secondary | ICD-10-CM | POA: Diagnosis not present

## 2019-05-02 DIAGNOSIS — M6281 Muscle weakness (generalized): Secondary | ICD-10-CM | POA: Diagnosis not present

## 2019-05-02 DIAGNOSIS — M79621 Pain in right upper arm: Secondary | ICD-10-CM | POA: Diagnosis not present

## 2019-05-05 ENCOUNTER — Other Ambulatory Visit (HOSPITAL_COMMUNITY): Payer: Self-pay | Admitting: Pulmonary Disease

## 2019-05-05 ENCOUNTER — Other Ambulatory Visit: Payer: Self-pay | Admitting: Pulmonary Disease

## 2019-05-05 DIAGNOSIS — M545 Low back pain, unspecified: Secondary | ICD-10-CM

## 2019-05-06 DIAGNOSIS — I69351 Hemiplegia and hemiparesis following cerebral infarction affecting right dominant side: Secondary | ICD-10-CM | POA: Diagnosis not present

## 2019-05-06 DIAGNOSIS — I10 Essential (primary) hypertension: Secondary | ICD-10-CM | POA: Diagnosis not present

## 2019-05-06 DIAGNOSIS — M545 Low back pain: Secondary | ICD-10-CM | POA: Diagnosis not present

## 2019-05-06 DIAGNOSIS — L03116 Cellulitis of left lower limb: Secondary | ICD-10-CM | POA: Diagnosis not present

## 2019-05-09 DIAGNOSIS — M6281 Muscle weakness (generalized): Secondary | ICD-10-CM | POA: Diagnosis not present

## 2019-05-09 DIAGNOSIS — R262 Difficulty in walking, not elsewhere classified: Secondary | ICD-10-CM | POA: Diagnosis not present

## 2019-05-09 DIAGNOSIS — M545 Low back pain: Secondary | ICD-10-CM | POA: Diagnosis not present

## 2019-05-09 DIAGNOSIS — M79621 Pain in right upper arm: Secondary | ICD-10-CM | POA: Diagnosis not present

## 2019-05-09 DIAGNOSIS — G3184 Mild cognitive impairment, so stated: Secondary | ICD-10-CM | POA: Diagnosis not present

## 2019-05-13 DIAGNOSIS — M545 Low back pain: Secondary | ICD-10-CM | POA: Diagnosis not present

## 2019-05-13 DIAGNOSIS — M6281 Muscle weakness (generalized): Secondary | ICD-10-CM | POA: Diagnosis not present

## 2019-05-13 DIAGNOSIS — G3184 Mild cognitive impairment, so stated: Secondary | ICD-10-CM | POA: Diagnosis not present

## 2019-05-13 DIAGNOSIS — M79621 Pain in right upper arm: Secondary | ICD-10-CM | POA: Diagnosis not present

## 2019-05-13 DIAGNOSIS — R262 Difficulty in walking, not elsewhere classified: Secondary | ICD-10-CM | POA: Diagnosis not present

## 2019-05-16 DIAGNOSIS — M6281 Muscle weakness (generalized): Secondary | ICD-10-CM | POA: Diagnosis not present

## 2019-05-16 DIAGNOSIS — M79621 Pain in right upper arm: Secondary | ICD-10-CM | POA: Diagnosis not present

## 2019-05-16 DIAGNOSIS — G3184 Mild cognitive impairment, so stated: Secondary | ICD-10-CM | POA: Diagnosis not present

## 2019-05-16 DIAGNOSIS — R262 Difficulty in walking, not elsewhere classified: Secondary | ICD-10-CM | POA: Diagnosis not present

## 2019-05-16 DIAGNOSIS — M545 Low back pain: Secondary | ICD-10-CM | POA: Diagnosis not present

## 2019-05-20 DIAGNOSIS — R262 Difficulty in walking, not elsewhere classified: Secondary | ICD-10-CM | POA: Diagnosis not present

## 2019-05-20 DIAGNOSIS — M545 Low back pain: Secondary | ICD-10-CM | POA: Diagnosis not present

## 2019-05-20 DIAGNOSIS — M79621 Pain in right upper arm: Secondary | ICD-10-CM | POA: Diagnosis not present

## 2019-05-20 DIAGNOSIS — G3184 Mild cognitive impairment, so stated: Secondary | ICD-10-CM | POA: Diagnosis not present

## 2019-05-20 DIAGNOSIS — M6281 Muscle weakness (generalized): Secondary | ICD-10-CM | POA: Diagnosis not present

## 2019-05-21 DIAGNOSIS — M25572 Pain in left ankle and joints of left foot: Secondary | ICD-10-CM | POA: Diagnosis not present

## 2019-05-21 DIAGNOSIS — M7989 Other specified soft tissue disorders: Secondary | ICD-10-CM | POA: Diagnosis not present

## 2019-05-21 DIAGNOSIS — M25472 Effusion, left ankle: Secondary | ICD-10-CM | POA: Diagnosis not present

## 2019-05-22 ENCOUNTER — Ambulatory Visit (HOSPITAL_COMMUNITY): Payer: Medicare Other | Attending: Pulmonary Disease

## 2019-05-22 ENCOUNTER — Encounter (HOSPITAL_COMMUNITY): Payer: Self-pay

## 2019-05-23 DIAGNOSIS — M79621 Pain in right upper arm: Secondary | ICD-10-CM | POA: Diagnosis not present

## 2019-05-23 DIAGNOSIS — M545 Low back pain: Secondary | ICD-10-CM | POA: Diagnosis not present

## 2019-05-23 DIAGNOSIS — M6281 Muscle weakness (generalized): Secondary | ICD-10-CM | POA: Diagnosis not present

## 2019-05-23 DIAGNOSIS — G3184 Mild cognitive impairment, so stated: Secondary | ICD-10-CM | POA: Diagnosis not present

## 2019-05-23 DIAGNOSIS — R262 Difficulty in walking, not elsewhere classified: Secondary | ICD-10-CM | POA: Diagnosis not present

## 2019-05-27 DIAGNOSIS — M6281 Muscle weakness (generalized): Secondary | ICD-10-CM | POA: Diagnosis not present

## 2019-05-27 DIAGNOSIS — M79621 Pain in right upper arm: Secondary | ICD-10-CM | POA: Diagnosis not present

## 2019-05-27 DIAGNOSIS — R262 Difficulty in walking, not elsewhere classified: Secondary | ICD-10-CM | POA: Diagnosis not present

## 2019-05-27 DIAGNOSIS — G3184 Mild cognitive impairment, so stated: Secondary | ICD-10-CM | POA: Diagnosis not present

## 2019-05-27 DIAGNOSIS — M545 Low back pain: Secondary | ICD-10-CM | POA: Diagnosis not present

## 2019-05-30 DIAGNOSIS — M79621 Pain in right upper arm: Secondary | ICD-10-CM | POA: Diagnosis not present

## 2019-05-30 DIAGNOSIS — G3184 Mild cognitive impairment, so stated: Secondary | ICD-10-CM | POA: Diagnosis not present

## 2019-05-30 DIAGNOSIS — M545 Low back pain: Secondary | ICD-10-CM | POA: Diagnosis not present

## 2019-05-30 DIAGNOSIS — M6281 Muscle weakness (generalized): Secondary | ICD-10-CM | POA: Diagnosis not present

## 2019-05-30 DIAGNOSIS — R262 Difficulty in walking, not elsewhere classified: Secondary | ICD-10-CM | POA: Diagnosis not present

## 2019-06-10 DIAGNOSIS — M545 Low back pain: Secondary | ICD-10-CM | POA: Diagnosis not present

## 2019-06-10 DIAGNOSIS — G3184 Mild cognitive impairment, so stated: Secondary | ICD-10-CM | POA: Diagnosis not present

## 2019-06-10 DIAGNOSIS — M79621 Pain in right upper arm: Secondary | ICD-10-CM | POA: Diagnosis not present

## 2019-06-10 DIAGNOSIS — M6281 Muscle weakness (generalized): Secondary | ICD-10-CM | POA: Diagnosis not present

## 2019-06-10 DIAGNOSIS — R262 Difficulty in walking, not elsewhere classified: Secondary | ICD-10-CM | POA: Diagnosis not present

## 2019-06-11 DIAGNOSIS — M545 Low back pain: Secondary | ICD-10-CM | POA: Diagnosis not present

## 2019-06-11 DIAGNOSIS — I639 Cerebral infarction, unspecified: Secondary | ICD-10-CM | POA: Diagnosis not present

## 2019-06-12 DIAGNOSIS — M79621 Pain in right upper arm: Secondary | ICD-10-CM | POA: Diagnosis not present

## 2019-06-12 DIAGNOSIS — G3184 Mild cognitive impairment, so stated: Secondary | ICD-10-CM | POA: Diagnosis not present

## 2019-06-12 DIAGNOSIS — M545 Low back pain: Secondary | ICD-10-CM | POA: Diagnosis not present

## 2019-06-12 DIAGNOSIS — R262 Difficulty in walking, not elsewhere classified: Secondary | ICD-10-CM | POA: Diagnosis not present

## 2019-06-12 DIAGNOSIS — M6281 Muscle weakness (generalized): Secondary | ICD-10-CM | POA: Diagnosis not present

## 2019-06-19 DIAGNOSIS — M545 Low back pain: Secondary | ICD-10-CM | POA: Diagnosis not present

## 2019-06-19 DIAGNOSIS — M79621 Pain in right upper arm: Secondary | ICD-10-CM | POA: Diagnosis not present

## 2019-06-19 DIAGNOSIS — R262 Difficulty in walking, not elsewhere classified: Secondary | ICD-10-CM | POA: Diagnosis not present

## 2019-06-19 DIAGNOSIS — M6281 Muscle weakness (generalized): Secondary | ICD-10-CM | POA: Diagnosis not present

## 2019-06-25 DIAGNOSIS — M545 Low back pain: Secondary | ICD-10-CM | POA: Diagnosis not present

## 2019-06-25 DIAGNOSIS — M79621 Pain in right upper arm: Secondary | ICD-10-CM | POA: Diagnosis not present

## 2019-06-25 DIAGNOSIS — M6281 Muscle weakness (generalized): Secondary | ICD-10-CM | POA: Diagnosis not present

## 2019-06-25 DIAGNOSIS — R262 Difficulty in walking, not elsewhere classified: Secondary | ICD-10-CM | POA: Diagnosis not present

## 2019-06-27 DIAGNOSIS — M6281 Muscle weakness (generalized): Secondary | ICD-10-CM | POA: Diagnosis not present

## 2019-06-27 DIAGNOSIS — R262 Difficulty in walking, not elsewhere classified: Secondary | ICD-10-CM | POA: Diagnosis not present

## 2019-06-27 DIAGNOSIS — M545 Low back pain: Secondary | ICD-10-CM | POA: Diagnosis not present

## 2019-06-27 DIAGNOSIS — M79621 Pain in right upper arm: Secondary | ICD-10-CM | POA: Diagnosis not present

## 2019-07-01 DIAGNOSIS — M6281 Muscle weakness (generalized): Secondary | ICD-10-CM | POA: Diagnosis not present

## 2019-07-01 DIAGNOSIS — M79621 Pain in right upper arm: Secondary | ICD-10-CM | POA: Diagnosis not present

## 2019-07-01 DIAGNOSIS — R262 Difficulty in walking, not elsewhere classified: Secondary | ICD-10-CM | POA: Diagnosis not present

## 2019-07-01 DIAGNOSIS — M545 Low back pain: Secondary | ICD-10-CM | POA: Diagnosis not present

## 2019-07-02 DIAGNOSIS — M47816 Spondylosis without myelopathy or radiculopathy, lumbar region: Secondary | ICD-10-CM | POA: Diagnosis not present

## 2019-07-02 DIAGNOSIS — M48062 Spinal stenosis, lumbar region with neurogenic claudication: Secondary | ICD-10-CM | POA: Diagnosis not present

## 2019-07-03 DIAGNOSIS — M1990 Unspecified osteoarthritis, unspecified site: Secondary | ICD-10-CM | POA: Diagnosis not present

## 2019-07-03 DIAGNOSIS — I69351 Hemiplegia and hemiparesis following cerebral infarction affecting right dominant side: Secondary | ICD-10-CM | POA: Diagnosis not present

## 2019-07-03 DIAGNOSIS — I10 Essential (primary) hypertension: Secondary | ICD-10-CM | POA: Diagnosis not present

## 2019-07-03 DIAGNOSIS — R21 Rash and other nonspecific skin eruption: Secondary | ICD-10-CM | POA: Diagnosis not present

## 2019-07-03 DIAGNOSIS — Z23 Encounter for immunization: Secondary | ICD-10-CM | POA: Diagnosis not present

## 2019-07-08 DIAGNOSIS — M79621 Pain in right upper arm: Secondary | ICD-10-CM | POA: Diagnosis not present

## 2019-07-08 DIAGNOSIS — R262 Difficulty in walking, not elsewhere classified: Secondary | ICD-10-CM | POA: Diagnosis not present

## 2019-07-08 DIAGNOSIS — M6281 Muscle weakness (generalized): Secondary | ICD-10-CM | POA: Diagnosis not present

## 2019-07-08 DIAGNOSIS — M545 Low back pain: Secondary | ICD-10-CM | POA: Diagnosis not present

## 2019-07-09 ENCOUNTER — Encounter: Payer: Self-pay | Admitting: Radiology

## 2019-07-09 DIAGNOSIS — M79621 Pain in right upper arm: Secondary | ICD-10-CM | POA: Diagnosis not present

## 2019-07-09 DIAGNOSIS — M545 Low back pain: Secondary | ICD-10-CM | POA: Diagnosis not present

## 2019-07-09 DIAGNOSIS — R262 Difficulty in walking, not elsewhere classified: Secondary | ICD-10-CM | POA: Diagnosis not present

## 2019-07-09 DIAGNOSIS — M6281 Muscle weakness (generalized): Secondary | ICD-10-CM | POA: Diagnosis not present

## 2019-07-16 DIAGNOSIS — R262 Difficulty in walking, not elsewhere classified: Secondary | ICD-10-CM | POA: Diagnosis not present

## 2019-07-16 DIAGNOSIS — M545 Low back pain: Secondary | ICD-10-CM | POA: Diagnosis not present

## 2019-07-16 DIAGNOSIS — M79621 Pain in right upper arm: Secondary | ICD-10-CM | POA: Diagnosis not present

## 2019-07-16 DIAGNOSIS — M6281 Muscle weakness (generalized): Secondary | ICD-10-CM | POA: Diagnosis not present

## 2019-07-18 DIAGNOSIS — R262 Difficulty in walking, not elsewhere classified: Secondary | ICD-10-CM | POA: Diagnosis not present

## 2019-07-18 DIAGNOSIS — M6281 Muscle weakness (generalized): Secondary | ICD-10-CM | POA: Diagnosis not present

## 2019-07-18 DIAGNOSIS — M79621 Pain in right upper arm: Secondary | ICD-10-CM | POA: Diagnosis not present

## 2019-07-18 DIAGNOSIS — M545 Low back pain: Secondary | ICD-10-CM | POA: Diagnosis not present

## 2019-07-30 DIAGNOSIS — M48062 Spinal stenosis, lumbar region with neurogenic claudication: Secondary | ICD-10-CM | POA: Diagnosis not present

## 2019-07-31 ENCOUNTER — Other Ambulatory Visit (HOSPITAL_COMMUNITY): Payer: Self-pay | Admitting: Physician Assistant

## 2019-07-31 ENCOUNTER — Other Ambulatory Visit: Payer: Self-pay | Admitting: Physician Assistant

## 2019-07-31 DIAGNOSIS — M48062 Spinal stenosis, lumbar region with neurogenic claudication: Secondary | ICD-10-CM

## 2019-08-06 DIAGNOSIS — M6281 Muscle weakness (generalized): Secondary | ICD-10-CM | POA: Diagnosis not present

## 2019-08-06 DIAGNOSIS — M79621 Pain in right upper arm: Secondary | ICD-10-CM | POA: Diagnosis not present

## 2019-08-06 DIAGNOSIS — M545 Low back pain: Secondary | ICD-10-CM | POA: Diagnosis not present

## 2019-08-06 DIAGNOSIS — R262 Difficulty in walking, not elsewhere classified: Secondary | ICD-10-CM | POA: Diagnosis not present

## 2019-08-07 ENCOUNTER — Ambulatory Visit (HOSPITAL_COMMUNITY)
Admission: RE | Admit: 2019-08-07 | Discharge: 2019-08-07 | Disposition: A | Discharge status: Home / Self Care | Payer: Medicare Other | Source: Ambulatory Visit | Attending: Physician Assistant | Admitting: Physician Assistant

## 2019-08-07 ENCOUNTER — Other Ambulatory Visit: Payer: Self-pay

## 2019-08-07 DIAGNOSIS — M5126 Other intervertebral disc displacement, lumbar region: Secondary | ICD-10-CM | POA: Diagnosis not present

## 2019-08-07 DIAGNOSIS — M48061 Spinal stenosis, lumbar region without neurogenic claudication: Secondary | ICD-10-CM | POA: Diagnosis not present

## 2019-08-07 DIAGNOSIS — M48062 Spinal stenosis, lumbar region with neurogenic claudication: Secondary | ICD-10-CM

## 2019-08-11 ENCOUNTER — Ambulatory Visit (HOSPITAL_COMMUNITY): Payer: Medicare Other

## 2019-08-13 DIAGNOSIS — M545 Low back pain: Secondary | ICD-10-CM | POA: Diagnosis not present

## 2019-08-13 DIAGNOSIS — R262 Difficulty in walking, not elsewhere classified: Secondary | ICD-10-CM | POA: Diagnosis not present

## 2019-08-13 DIAGNOSIS — M79621 Pain in right upper arm: Secondary | ICD-10-CM | POA: Diagnosis not present

## 2019-08-13 DIAGNOSIS — M6281 Muscle weakness (generalized): Secondary | ICD-10-CM | POA: Diagnosis not present

## 2019-08-20 DIAGNOSIS — M47816 Spondylosis without myelopathy or radiculopathy, lumbar region: Secondary | ICD-10-CM | POA: Diagnosis not present

## 2019-09-08 DIAGNOSIS — I69351 Hemiplegia and hemiparesis following cerebral infarction affecting right dominant side: Secondary | ICD-10-CM | POA: Diagnosis not present

## 2019-09-08 DIAGNOSIS — G4733 Obstructive sleep apnea (adult) (pediatric): Secondary | ICD-10-CM | POA: Diagnosis not present

## 2019-09-08 DIAGNOSIS — M545 Low back pain: Secondary | ICD-10-CM | POA: Diagnosis not present

## 2019-09-08 DIAGNOSIS — I10 Essential (primary) hypertension: Secondary | ICD-10-CM | POA: Diagnosis not present

## 2019-09-11 DIAGNOSIS — F411 Generalized anxiety disorder: Secondary | ICD-10-CM | POA: Insufficient documentation

## 2019-09-11 DIAGNOSIS — I10 Essential (primary) hypertension: Secondary | ICD-10-CM | POA: Insufficient documentation

## 2019-09-11 DIAGNOSIS — N390 Urinary tract infection, site not specified: Secondary | ICD-10-CM | POA: Insufficient documentation

## 2019-09-11 DIAGNOSIS — F419 Anxiety disorder, unspecified: Secondary | ICD-10-CM

## 2019-09-11 DIAGNOSIS — M109 Gout, unspecified: Secondary | ICD-10-CM | POA: Insufficient documentation

## 2019-09-11 DIAGNOSIS — R531 Weakness: Secondary | ICD-10-CM | POA: Insufficient documentation

## 2019-09-11 DIAGNOSIS — M1990 Unspecified osteoarthritis, unspecified site: Secondary | ICD-10-CM | POA: Insufficient documentation

## 2019-09-11 DIAGNOSIS — M545 Low back pain, unspecified: Secondary | ICD-10-CM | POA: Insufficient documentation

## 2019-09-11 DIAGNOSIS — I6990 Unspecified sequelae of unspecified cerebrovascular disease: Secondary | ICD-10-CM | POA: Insufficient documentation

## 2019-09-11 DIAGNOSIS — F321 Major depressive disorder, single episode, moderate: Secondary | ICD-10-CM

## 2019-09-11 DIAGNOSIS — Z8673 Personal history of transient ischemic attack (TIA), and cerebral infarction without residual deficits: Secondary | ICD-10-CM | POA: Insufficient documentation

## 2019-09-11 DIAGNOSIS — G4700 Insomnia, unspecified: Secondary | ICD-10-CM | POA: Insufficient documentation

## 2019-09-11 DIAGNOSIS — I69351 Hemiplegia and hemiparesis following cerebral infarction affecting right dominant side: Secondary | ICD-10-CM | POA: Insufficient documentation

## 2019-09-11 DIAGNOSIS — R609 Edema, unspecified: Secondary | ICD-10-CM | POA: Insufficient documentation

## 2019-09-11 DIAGNOSIS — F410 Panic disorder [episodic paroxysmal anxiety] without agoraphobia: Secondary | ICD-10-CM | POA: Insufficient documentation

## 2019-11-10 ENCOUNTER — Other Ambulatory Visit: Payer: Self-pay

## 2019-11-10 ENCOUNTER — Ambulatory Visit: Payer: Medicare Other | Attending: Internal Medicine

## 2019-11-10 DIAGNOSIS — Z20822 Contact with and (suspected) exposure to covid-19: Secondary | ICD-10-CM

## 2019-11-11 LAB — NOVEL CORONAVIRUS, NAA: SARS-CoV-2, NAA: NOT DETECTED

## 2019-11-13 ENCOUNTER — Telehealth: Payer: Self-pay

## 2019-11-13 NOTE — Telephone Encounter (Signed)
Pt notified of negative COVID-19 results. Understanding verbalized.  Chasta M Hopkins   

## 2019-11-17 DIAGNOSIS — M47816 Spondylosis without myelopathy or radiculopathy, lumbar region: Secondary | ICD-10-CM | POA: Diagnosis not present

## 2019-11-20 ENCOUNTER — Other Ambulatory Visit: Payer: Self-pay

## 2019-11-20 ENCOUNTER — Ambulatory Visit: Payer: Medicare Other | Admitting: Family Medicine

## 2019-11-20 ENCOUNTER — Other Ambulatory Visit: Payer: Self-pay | Admitting: Family Medicine

## 2019-11-21 ENCOUNTER — Ambulatory Visit (INDEPENDENT_AMBULATORY_CARE_PROVIDER_SITE_OTHER): Payer: Medicare Other | Admitting: Family Medicine

## 2019-11-21 ENCOUNTER — Encounter: Payer: Self-pay | Admitting: Family Medicine

## 2019-11-21 VITALS — BP 109/75 | HR 94 | Temp 97.5°F | Ht 67.0 in | Wt 216.8 lb

## 2019-11-21 DIAGNOSIS — F321 Major depressive disorder, single episode, moderate: Secondary | ICD-10-CM

## 2019-11-21 DIAGNOSIS — Z8673 Personal history of transient ischemic attack (TIA), and cerebral infarction without residual deficits: Secondary | ICD-10-CM | POA: Diagnosis not present

## 2019-11-21 DIAGNOSIS — G40909 Epilepsy, unspecified, not intractable, without status epilepticus: Secondary | ICD-10-CM

## 2019-11-21 DIAGNOSIS — F410 Panic disorder [episodic paroxysmal anxiety] without agoraphobia: Secondary | ICD-10-CM

## 2019-11-21 DIAGNOSIS — R2681 Unsteadiness on feet: Secondary | ICD-10-CM | POA: Insufficient documentation

## 2019-11-21 DIAGNOSIS — R6889 Other general symptoms and signs: Secondary | ICD-10-CM

## 2019-11-21 DIAGNOSIS — I480 Paroxysmal atrial fibrillation: Secondary | ICD-10-CM | POA: Diagnosis not present

## 2019-11-21 DIAGNOSIS — I10 Essential (primary) hypertension: Secondary | ICD-10-CM | POA: Diagnosis not present

## 2019-11-21 DIAGNOSIS — I5032 Chronic diastolic (congestive) heart failure: Secondary | ICD-10-CM

## 2019-11-21 DIAGNOSIS — R609 Edema, unspecified: Secondary | ICD-10-CM

## 2019-11-21 DIAGNOSIS — I69398 Other sequelae of cerebral infarction: Secondary | ICD-10-CM

## 2019-11-21 DIAGNOSIS — M545 Low back pain, unspecified: Secondary | ICD-10-CM

## 2019-11-21 DIAGNOSIS — M1A9XX0 Chronic gout, unspecified, without tophus (tophi): Secondary | ICD-10-CM

## 2019-11-21 DIAGNOSIS — G4700 Insomnia, unspecified: Secondary | ICD-10-CM

## 2019-11-21 NOTE — Progress Notes (Signed)
New Patient Office Visit  Assessment & Plan:  1-2. Difficulty writing/History of stroke - Ambulatory referral to Occupational Therapy - Lipid panel  3. Essential (primary) hypertension - Well controlled on current regimen.  - CMP14+EGFR - Lipid panel  4. Panic disorder without agoraphobia - Well controlled on current regimen.  - CMP14+EGFR  5. Major depressive disorder, single episode, moderate (HCC) - Well controlled on current regimen.  - CMP14+EGFR  6. Edema, unspecified type - Well controlled on current regimen.  - CBC with Differential/Platelet - CMP14+EGFR  7. Paroxysmal atrial fibrillation (HCC) - Well controlled on current regimen.  - CBC with Differential/Platelet  8. DIASTOLIC HEART FAILURE, CHRONIC - Well controlled on current regimen.   9. Seizure disorder as sequela of cerebrovascular accident [facility]) - Well controlled on current regimen.   10. Chronic gout without tophus, unspecified cause, unspecified site - Well controlled on current regimen.   11. Insomnia, unspecified type - Patient reports she does not sleep well with trazodone 50 mg at bedtime.  Discussed I wanted to review her medical records before changes were made.  12. Low back pain, unspecified back pain laterality, unspecified chronicity, unspecified whether sciatica present - Patient to continue to follow with [facility].   Discussed with patient that I am unwilling to change any of her medications at this time.  I would like to review her records from Dr. Juanetta Gosling, her previous PCP, before changes are made.   Follow-up: Return in about 6 weeks (around 01/02/2020) for follow-up of chronic medication conditions.   Molly Boston, MSN, APRN, FNP-C [facility]  Subjective:  Patient ID: [name], female    DOB: 04-27-1951  Age: 69 y.o. MRN: [id number]  Patient Care Team: Kari Baars, MD as PCP - General (Internal Medicine)  CC:  Chief  Complaint  Patient presents with  . New Patient (Initial Visit)    DR. Hawkins  . Establish Care  . Therapy    learning how to write again after 3 strokes    HPI [name] presents to establish care.  Patient is transferring care from Dr. Juanetta Gosling as he has recently retired.  She has not signed a release for medical records.   Patient's biggest concern today is a referral to therapy to learn how to write again after having multiple strokes.  She reports she has done physical and speech therapy but never occupational therapy.  She [facility] her therapy at [facility] in [place].   Patient states she would like to come off of any medication she does not need.  States she takes furosemide for swelling in her feet and ankles.  Patient has a history of diastolic heart failure.  She has no idea why she takes Lamictal but seizures is listed in her medical history.  She reports she does not recall ever having seizures.  Reports she saw Dr. Gerilyn Pilgrim after going to the stroke center.  She was last seen by him 4 to 5 months ago.  Patient wants something for pain.  She has a prescription for meloxicam but states it does not help.  She is established at [facility] and was just given 5 shots in her back on Monday.  Depression screen PHQ 2/9 11/21/2019  Decreased Interest 1  Down, Depressed, Hopeless 0  PHQ - 2 Score 1  Altered sleeping 3  Tired, decreased energy 0  Change in appetite 3  Feeling bad or failure about yourself  0  Trouble concentrating 0  Moving slowly or fidgety/restless 0  Suicidal thoughts 0  PHQ-9 Score 7  Difficult doing work/chores Somewhat difficult   GAD 7 : Generalized Anxiety Score 11/21/2019  Nervous, Anxious, on Edge 3  Control/stop worrying 2  Worry too much - different things 1  Trouble relaxing 3  Restless 0  Easily annoyed or irritable 1  Afraid - awful might happen 0  Total GAD 7 Score 10  Anxiety Difficulty  Somewhat difficult    Review of Systems  Constitutional: Negative for chills, fever, malaise/fatigue and weight loss.  HENT: Negative for congestion, ear discharge, ear pain, nosebleeds, sinus pain, sore throat and tinnitus.   Eyes: Negative for blurred vision, double vision, pain, discharge and redness.  Respiratory: Negative for cough, shortness of breath and wheezing.   Cardiovascular: Positive for leg swelling. Negative for chest pain and palpitations.  Gastrointestinal: Negative for abdominal pain, constipation, diarrhea, heartburn, nausea and vomiting.  Genitourinary: Negative for dysuria, frequency and urgency.  Musculoskeletal: Positive for back pain. Negative for myalgias.  Skin: Negative for rash.  Neurological: Negative for dizziness, seizures, weakness and headaches.  Psychiatric/Behavioral: Negative for depression, substance abuse and suicidal ideas. The patient is not nervous/anxious.     Current Outpatient Medications:  .  allopurinol (ZYLOPRIM) 300 MG tablet, Take 300 mg by mouth daily., Disp: , Rfl: 12 .  atorvastatin (LIPITOR) 40 MG tablet, Take 40 mg by mouth at bedtime., Disp: , Rfl: 12 .  busPIRone (BUSPAR) 10 MG tablet, Take 10 mg by mouth 2 (two) times daily., Disp: , Rfl:  .  calcium-vitamin D (OSCAL WITH D) 500-200 MG-UNIT tablet, Take 1 tablet by mouth 2 (two) times daily., Disp: , Rfl:  .  Cholecalciferol (VITAMIN D3) 25 MCG (1000 UT) CAPS, Take by mouth., Disp: , Rfl:  .  DULoxetine (CYMBALTA) 60 MG capsule, Take 60 mg by mouth every morning., Disp: , Rfl: 2 .  furosemide (LASIX) 40 MG tablet, Take 40 mg by mouth daily., Disp: , Rfl: 12 .  lamoTRIgine (LAMICTAL) 25 MG tablet, Take 50 mg by mouth 2 (two) times daily., Disp: , Rfl: 12 .  meloxicam (MOBIC) 7.5 MG tablet, Take 7.5 mg by mouth 2 (two) times daily., Disp: , Rfl: 12 .  OVER THE COUNTER MEDICATION, Daly- vite tablet, Disp: , Rfl:  .  pantoprazole (PROTONIX) 40 MG tablet, Take 40 mg by mouth 2 (two)  times daily., Disp: , Rfl: 12 .  potassium chloride SA (K-DUR,KLOR-CON) 20 MEQ tablet, Take 20 mEq by mouth daily., Disp: , Rfl: 12 .  QUEtiapine (SEROQUEL) 100 MG tablet, Take 100 mg by mouth daily., Disp: , Rfl: 12 .  traZODone (DESYREL) 50 MG tablet, Take 50 mg by mouth at bedtime., Disp: , Rfl:   No Known Allergies  Past Medical History:  Diagnosis Date  . Alcohol abuse   . Anxiety   . Anxiety disorder, unspecified   . Arthritis   . Chronic back pain    radiating to bilat legs/weakness  . Chronic knee pain   . Compression fracture of T11 vertebra (HCC)   . Depression   . Edema   . Edema, unspecified   . Essential (primary) hypertension   . Gout   . Hemiplegia and hemiparesis following cerebral infarction affecting right dominant side (HCC)   . History of dental problems   . Hypertension   . Insomnia, unspecified   . Low back pain   . Lumbago   . Major depressive  disorder, single episode, moderate (HCC)   . PAF (paroxysmal atrial fibrillation) (HCC)   . Pain in joint involving shoulder region   . Panic attacks   . Panic disorder without agoraphobia   . Pelvic pain   . Personal history of transient ischemic attack (TIA), and cerebral infarction without residual deficits   . Seizures (HCC)   . Spinal stenosis, lumbar   . Stroke (HCC)   . Subarachnoid hemorrhage (HCC)   . Unspecified osteoarthritis, unspecified site   . Unspecified sequelae of unspecified cerebrovascular disease   . Urinary tract infection, site not specified   . Weakness     Past Surgical History:  Procedure Laterality Date  . BARIATRIC SURGERY    . BIOPSY N/A 12/02/2015   Procedure: BIOPSY;  Surgeon: Malissa Hippo, MD;  Location: [facility];  Service: Endoscopy;  Laterality: N/A;  Random colon biopsies  . CHOLECYSTECTOMY    . COLONOSCOPY    . COLONOSCOPY N/A 12/02/2015   Procedure: COLONOSCOPY;  Surgeon: Malissa Hippo, MD;  Location: [facility];  Service: Endoscopy;  Laterality: N/A;   110  . EYE SURGERY     as a child  . GASTRIC BYPASS     in 1985 for obesity  . POLYPECTOMY N/A 12/02/2015   Procedure: POLYPECTOMY;  Surgeon: Malissa Hippo, MD;  Location: [facility];  Service: Endoscopy;  Laterality: N/A;  Splenic flexure polyp  . right foot surgery      Family History  Problem Relation Age of Onset  . Healthy Mother   . Hodgkin's lymphoma Father   . Arthritis Sister     Social History   Socioeconomic History  . Marital status: Married    Spouse name: Not on file  . Number of children: Not on file  . Years of education: Not on file  . Highest education level: Not on file  Occupational History  . Not on file  Tobacco Use  . Smoking status: Light Tobacco Smoker    Types: Cigarettes  . Smokeless tobacco: Never Used  . Tobacco comment: Patient has quit but not sure when   Substance and Sexual Activity  . Alcohol use: No    Alcohol/week: 0.0 standard drinks  . Drug use: No  . Sexual activity: Not Currently  Other Topics Concern  . Not on file  Social History Narrative  . Not on file   Social Determinants of Health   Financial Resource Strain:   . Difficulty of Paying Living Expenses: Not on file  Food Insecurity:   . Worried About Programme researcher, broadcasting/film/video in the Last Year: Not on file  . Ran Out of Food in the Last Year: Not on file  Transportation Needs:   . Lack of Transportation (Medical): Not on file  . Lack of Transportation (Non-Medical): Not on file  Physical Activity:   . Days of Exercise per Week: Not on file  . Minutes of Exercise per Session: Not on file  Stress:   . Feeling of Stress : Not on file  Social Connections:   . Frequency of Communication with Friends and Family: Not on file  . Frequency of Social Gatherings with Friends and Family: Not on file  . Attends Religious Services: Not on file  . Active Member of Clubs or Organizations: Not on file  . Attends Banker Meetings: Not on file  . Marital Status: Not on  file  Intimate Partner Violence:   . Fear of Current  or Ex-Partner: Not on file  . Emotionally Abused: Not on file  . Physically Abused: Not on file  . Sexually Abused: Not on file    Objective:   Today's Vitals: BP 109/75   Pulse 94   Temp (!) 97.5 F (36.4 C) (Temporal)   Ht 5\' 7"  (1.702 m)   Wt 216 lb 12.8 oz (98.3 kg)   SpO2 93%   BMI 33.96 kg/m   Physical Exam Vitals reviewed.  Constitutional:      General: She is not in acute distress.    Appearance: Normal appearance. She is obese. She is not ill-appearing, toxic-appearing or diaphoretic.  HENT:     Head: Normocephalic and atraumatic.  Eyes:     General: No scleral icterus.       Right eye: No discharge.        Left eye: No discharge.     Conjunctiva/sclera: Conjunctivae normal.  Cardiovascular:     Rate and Rhythm: Normal rate and regular rhythm.     Heart sounds: Normal heart sounds. No murmur. No friction rub. No gallop.   Pulmonary:     Effort: Pulmonary effort is normal. No respiratory distress.     Breath sounds: Normal breath sounds. No stridor. No wheezing, rhonchi or rales.  Musculoskeletal:        General: Normal range of motion.     Cervical back: Normal range of motion.  Skin:    General: Skin is warm and dry.     Capillary Refill: Capillary refill takes less than 2 seconds.  Neurological:     General: No focal deficit present.     Mental Status: She is alert and oriented to person, place, and time. Mental status is at baseline.  Psychiatric:        Mood and Affect: Mood normal.        Behavior: Behavior normal.        Thought Content: Thought content normal.        Judgment: Judgment normal.

## 2019-11-21 NOTE — Patient Instructions (Addendum)
[facility]: Drive-through at [place] on Tuesdays and Thursdays from 10 AM - 2 PM. Look for the big white tent. [place], [place] [address]. Patient must bring consent form already filled out, which is online at [email or web address]  [place]:  . [place] offering at the [facility] 8 AM - 1 PM, then will move to the [employer] on 11/12/19. To schedule visit [email or web address] . Public Health offering at the following: (Schedule appt: call [phone] and select option 2) o [facility]: [place], [place] [address] o [facility]: [id number] [place], [place] [address] o  [facility]: [place] [address], [place] [address]  [place]: Hotel manager in [place]. This is a first-come, first-serve drive thru clinic OR call their [place] hotline at [phone]. They soon will offer testing and vaccination at [facility].  [facility]: Call [place]: [facility] Department [phone], option 7.   Obesity, Adult Obesity is having too much body fat. Being obese means that your weight is more than what is healthy for you. BMI is a number that explains how much body fat you have. If you have a BMI of 30 or more, you are obese. Obesity is often caused by eating or drinking more calories than your body uses. Changing your lifestyle can help you lose weight. Obesity can cause serious health problems, such as:  Stroke.  Coronary artery disease (CAD).  Type 2 diabetes.  Some types of cancer, including cancers of the colon, breast, uterus, and gallbladder.  Osteoarthritis.  High blood pressure (hypertension).  High cholesterol.  Sleep apnea.  Gallbladder stones.  Infertility problems. What are the  causes?  Eating meals each day that are high in calories, sugar, and fat.  Being born with genes that may make you more likely to become obese.  Having a medical condition that causes obesity.  Taking certain medicines.  Sitting a lot (having a sedentary lifestyle).  Not getting enough sleep.  Drinking a lot of drinks that have sugar in them. What increases the risk?  Having a family history of obesity.  Being an [place] American woman.  Being a Hispanic man.  Living in an area with limited access to: ? Romilda Garret, recreation centers, or sidewalks. ? Healthy food choices, such as grocery stores and farmers' markets. What are the signs or symptoms? The main sign is having too much body fat. How is this treated?  Treatment for this condition often includes changing your lifestyle. Treatment may include: ? Changing your diet. This may include making a healthy meal plan. ? Exercise. This may include activity that causes your heart to beat faster (aerobic exercise) and strength training. Work with your doctor to design a program that works for you. ? Medicine to help you lose weight. This may be used if you are not able to lose 1 pound a week after 6 weeks of healthy eating and more exercise. ? Treating conditions that cause the obesity. ? Surgery. Options may include gastric banding and gastric bypass. This may be done if:  Other treatments have not helped to improve your condition.  You have a BMI of 40 or higher.  You have life-threatening health problems related to obesity. Follow these instructions at home: Eating and drinking   Follow advice from your doctor about what to eat and drink. Your  doctor may tell you to: ? Limit fast food, sweets, and processed snack foods. ? Choose low-fat options. For example, choose low-fat milk instead of whole milk. ? Eat 5 or more servings of fruits or vegetables each day. ? Eat at home more often. This gives you more control over what  you eat. ? Choose healthy foods when you eat out. ? Learn to read food labels. This will help you learn how much food is in 1 serving. ? Keep low-fat snacks available. ? Avoid drinks that have a lot of sugar in them. These include soda, fruit juice, iced tea with sugar, and flavored milk.  Drink enough water to keep your pee (urine) pale yellow.  Do not go on fad diets. Physical activity  Exercise often, as told by your doctor. Most adults should get up to 150 minutes of moderate-intensity exercise every week.Ask your doctor: ? What types of exercise are safe for you. ? How often you should exercise.  Warm up and stretch before being active.  Do slow stretching after being active (cool down).  Rest between times of being active. Lifestyle  Work with your doctor and a food expert (dietitian) to set a weight-loss goal that is best for you.  Limit your screen time.  Find ways to reward yourself that do not involve food.  Do not drink alcohol if: ? Your doctor tells you not to drink. ? You are pregnant, may be pregnant, or are planning to become pregnant.  If you drink alcohol: ? Limit how much you use to:  0-1 drink a day for women.  0-2 drinks a day for men. ? Be aware of how much alcohol is in your drink. In the [place] one drink equals one 12 oz bottle of beer (355 mL), one 5 oz glass of wine (148 mL), or one 1 oz glass of hard liquor (44 mL). General instructions  Keep a weight-loss journal. This can help you keep track of: ? The food that you eat. ? How much exercise you get.  Take over-the-counter and prescription medicines only as told by your doctor.  Take vitamins and supplements only as told by your doctor.  Think about joining a support group.  Keep all follow-up visits as told by your doctor. This is important. Contact a doctor if:  You cannot meet your weight loss goal after you have changed your diet and lifestyle for 6 weeks. Get help right away if  you:  Are having trouble breathing.  Are having thoughts of harming yourself. Summary  Obesity is having too much body fat.  Being obese means that your weight is more than what is healthy for you.  Work with your doctor to set a weight-loss goal.  Get regular exercise as told by your doctor. This information is not intended to replace advice given to you by your health care provider. Make sure you discuss any questions you have with your health care provider. Document Revised: 06/06/2018 Document Reviewed: 06/06/2018 Elsevier Patient Education  2020 [employer].

## 2019-11-22 LAB — CBC WITH DIFFERENTIAL/PLATELET
Basophils Absolute: 0.1 10*3/uL (ref 0.0–0.2)
Basos: 1 %
EOS (ABSOLUTE): 0.2 10*3/uL (ref 0.0–0.4)
Eos: 2 %
Hematocrit: 39.3 % (ref 34.0–46.6)
Hemoglobin: 12.8 g/dL (ref 11.1–15.9)
Immature Grans (Abs): 0 10*3/uL (ref 0.0–0.1)
Immature Granulocytes: 0 %
Lymphocytes Absolute: 1.9 10*3/uL (ref 0.7–3.1)
Lymphs: 25 %
MCH: 30.4 pg (ref 26.6–33.0)
MCHC: 32.6 g/dL (ref 31.5–35.7)
MCV: 93 fL (ref 79–97)
Monocytes Absolute: 0.8 10*3/uL (ref 0.1–0.9)
Monocytes: 11 %
Neutrophils Absolute: 4.6 10*3/uL (ref 1.4–7.0)
Neutrophils: 61 %
Platelets: 273 10*3/uL (ref 150–450)
RBC: 4.21 x10E6/uL (ref 3.77–5.28)
RDW: 15.5 % — ABNORMAL HIGH (ref 11.7–15.4)
WBC: 7.4 10*3/uL (ref 3.4–10.8)

## 2019-11-22 LAB — LIPID PANEL
Chol/HDL Ratio: 3 ratio (ref 0.0–4.4)
Cholesterol, Total: 170 mg/dL (ref 100–199)
HDL: 57 mg/dL (ref >39)
LDL Chol Calc (NIH): 65 mg/dL (ref 0–99)
Triglycerides: 305 mg/dL — ABNORMAL HIGH (ref 0–149)
VLDL Cholesterol Cal: 48 mg/dL — ABNORMAL HIGH (ref 5–40)

## 2019-11-22 LAB — CMP14+EGFR
ALT: 18 IU/L (ref 0–32)
AST: 22 IU/L (ref 0–40)
Albumin/Globulin Ratio: 1.8 (ref 1.2–2.2)
Albumin: 4.1 g/dL (ref 3.8–4.8)
Alkaline Phosphatase: 136 IU/L — ABNORMAL HIGH (ref 39–117)
BUN/Creatinine Ratio: 19 (ref 12–28)
BUN: 17 mg/dL (ref 8–27)
Bilirubin Total: 0.3 mg/dL (ref 0.0–1.2)
CO2: 20 mmol/L (ref 20–29)
Calcium: 9.3 mg/dL (ref 8.7–10.3)
Chloride: 108 mmol/L — ABNORMAL HIGH (ref 96–106)
Creatinine, Ser: 0.88 mg/dL (ref 0.57–1.00)
GFR calc Af Amer: 79 mL/min/{1.73_m2} (ref >59)
GFR calc non Af Amer: 68 mL/min/{1.73_m2} (ref >59)
Globulin, Total: 2.3 g/dL (ref 1.5–4.5)
Glucose: 106 mg/dL — ABNORMAL HIGH (ref 65–99)
Potassium: 4.2 mmol/L (ref 3.5–5.2)
Sodium: 144 mmol/L (ref 134–144)
Total Protein: 6.4 g/dL (ref 6.0–8.5)

## 2019-11-23 ENCOUNTER — Encounter: Payer: Self-pay | Admitting: Family Medicine

## 2019-11-24 ENCOUNTER — Other Ambulatory Visit: Payer: Self-pay | Admitting: Family Medicine

## 2019-11-25 ENCOUNTER — Encounter: Payer: Self-pay | Admitting: Family Medicine

## 2019-12-05 ENCOUNTER — Encounter: Payer: Self-pay | Admitting: Family Medicine

## 2019-12-08 ENCOUNTER — Ambulatory Visit (INDEPENDENT_AMBULATORY_CARE_PROVIDER_SITE_OTHER): Payer: Medicare Other | Admitting: Family

## 2019-12-08 ENCOUNTER — Other Ambulatory Visit: Payer: Self-pay

## 2019-12-08 NOTE — Progress Notes (Signed)
PT called at 5:13 pm and 5:20 pm no answer.

## 2019-12-15 DIAGNOSIS — M48062 Spinal stenosis, lumbar region with neurogenic claudication: Secondary | ICD-10-CM | POA: Diagnosis not present

## 2019-12-22 ENCOUNTER — Other Ambulatory Visit: Payer: Self-pay | Admitting: Family Medicine

## 2019-12-30 ENCOUNTER — Ambulatory Visit (INDEPENDENT_AMBULATORY_CARE_PROVIDER_SITE_OTHER): Payer: Medicare Other | Admitting: *Deleted

## 2019-12-30 DIAGNOSIS — Z0000 Encounter for general adult medical examination without abnormal findings: Secondary | ICD-10-CM

## 2019-12-30 NOTE — Progress Notes (Signed)
MEDICARE ANNUAL WELLNESS VISIT  12/30/2019  Telephone Visit Disclaimer This Medicare AWV was conducted by telephone due to national recommendations for restrictions regarding the COVID-19 Pandemic (e.g. social distancing).  I verified, using two identifiers, that I am speaking with [name] or their authorized healthcare agent. I discussed the limitations, risks, security, and privacy concerns of performing an evaluation and management service by telephone and the potential availability of an in-person appointment in the future. The patient expressed understanding and agreed to proceed.   Subjective:  [name] is a 69 y.o. female patient of Molly Fudge, FNP who had a Medicare Annual Wellness Visit today via telephone. [name] is retired and lives with her husband [name]. She has 1 daughter who lives at the [employer]. She reports that she is socially active and does interact with friends/family regularly. She is not currently physically active due to back pain and enjoys crocheting.   Patient Care Team: Molly Fudge, FNP as PCP - General (Family Medicine)  Advanced Directives 12/30/2019 09/07/2018 07/27/2016 07/24/2016 07/13/2016 12/02/2015  Does Patient Have a Medical Advance Directive? No No No No No No  Would patient like information on creating a medical advance directive? No - Patient declined No - Patient declined No - patient declined information No - patient declined information No - patient declined information Yes - Educational materials given    Hospital Utilization Over the Past 12 Months: # of hospitalizations or ER visits: 0 # of surgeries: 0  Review of Systems    Patient reports that her overall health is unchanged compared to last year.  History obtained from the patient.  Patient Reported Readings (BP, Pulse, CBG, Weight, etc) none  Pain Assessment Pain : 0-10 Pain Score: 10-Worst pain ever Pain Type: Chronic pain Pain Location: Back Pain Orientation:  Lower Pain Descriptors / Indicators: Aching Pain Onset: More than a month ago Pain Frequency: Constant Pain Relieving Factors: sometimes laying down Effect of Pain on Daily Activities: makes things difficult  Pain Relieving Factors: sometimes laying down  Current Medications & Allergies (verified) Allergies as of 12/30/2019   No Known Allergies     Medication List       Accurate as of December 30, 2019  1:58 PM. If you have any questions, ask your nurse or doctor.        allopurinol 300 MG tablet Commonly known as: ZYLOPRIM Take 300 mg by mouth daily.   atorvastatin 40 MG tablet Commonly known as: LIPITOR Take 40 mg by mouth at bedtime.   busPIRone 10 MG tablet Commonly known as: BUSPAR Take 10 mg by mouth 2 (two) times daily.   calcium-vitamin D 500-200 MG-UNIT tablet Commonly known as: OSCAL WITH D Take 1 tablet by mouth 2 (two) times daily.   DULoxetine 60 MG capsule Commonly known as: CYMBALTA Take 60 mg by mouth every morning.   furosemide 40 MG tablet Commonly known as: LASIX Take 40 mg by mouth daily.   lamoTRIgine 25 MG tablet Commonly known as: LAMICTAL Take 50 mg by mouth 2 (two) times daily.   meloxicam 7.5 MG tablet Commonly known as: MOBIC Take 7.5 mg by mouth 2 (two) times daily.   OVER THE COUNTER MEDICATION Daly- vite tablet   pantoprazole 40 MG tablet Commonly known as: PROTONIX Take 40 mg by mouth 2 (two) times daily.   potassium chloride SA 20 MEQ tablet Commonly known as: KLOR-CON Take 20 mEq by mouth daily.   QUEtiapine 100  MG tablet Commonly known as: SEROQUEL Take 100 mg by mouth daily.   traZODone 50 MG tablet Commonly known as: DESYREL Take 50 mg by mouth at bedtime.   Vitamin D3 25 MCG (1000 UT) Caps Take by mouth.       History (reviewed): Past Medical History:  Diagnosis Date  . Alcohol abuse   . Anxiety   . Arthritis   . Chronic back pain    radiating to bilat legs/weakness  . Chronic knee pain   .  Compression fracture of T11 vertebra (HCC)   . Edema   . Gout   . Hemiplegia and hemiparesis following cerebral infarction affecting right dominant side (HCC)   . History of dental problems   . Hypertension   . Insomnia, unspecified   . Low back pain   . Major depressive disorder, single episode, moderate (HCC)   . PAF (paroxysmal atrial fibrillation) (HCC)   . Pain in joint involving shoulder region   . Panic disorder without agoraphobia   . Pelvic pain   . Seizures (HCC)   . Spinal stenosis, lumbar   . 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 Subarachnoid hemorrhage (HCC)   . Unspecified sequelae of unspecified cerebrovascular disease    Past Surgical History:  Procedure Laterality Date  . BARIATRIC SURGERY    . BIOPSY N/A 12/02/2015   Procedure: BIOPSY;  Surgeon: Malissa Hippo, MD;  Location: [facility];  Service: Endoscopy;  Laterality: N/A;  Random colon biopsies  . CHOLECYSTECTOMY    . COLONOSCOPY    . COLONOSCOPY N/A 12/02/2015   Procedure: COLONOSCOPY;  Surgeon: Malissa Hippo, MD;  Location: [facility];  Service: Endoscopy;  Laterality: N/A;  110  . EYE SURGERY     as a child  . GASTRIC BYPASS     in 1985 for obesity  . POLYPECTOMY N/A 12/02/2015   Procedure: POLYPECTOMY;  Surgeon: Malissa Hippo, MD;  Location: [facility];  Service: Endoscopy;  Laterality: N/A;  Splenic flexure polyp  . right foot surgery     Family History  Problem Relation Age of Onset  . Healthy Mother   . Hodgkin's lymphoma Father   . Arthritis Sister    Social History   Socioeconomic History  . Marital status: Married    Spouse name: [name]  . Number of children: 1  . Years of education: 48  . Highest education level: Some college, no degree  Occupational History  . Occupation: retired    Comment: Designer, fashion/clothing, cna  Tobacco Use  . Smoking status: Former Smoker    Types: Cigarettes  . Smokeless  tobacco: Never Used  . Tobacco comment: Patient has quit but not sure when   Substance and Sexual Activity  . Alcohol use: No    Alcohol/week: 0.0 standard drinks  . Drug use: No  . Sexual activity: Not Currently  Other Topics Concern  . Not on file  Social History Narrative  . Not on file   Social Determinants of Health   Financial Resource Strain:   . Difficulty of Paying Living Expenses:   Food Insecurity:   . Worried About Programme researcher, broadcasting/film/video in the Last Year:   . Barista in the Last Year:   Transportation Needs:   . Freight forwarder (Medical):   Marland Kitchen Lack of Transportation (Non-Medical):   Physical Activity:   . Days  of Exercise per Week:   . Minutes of Exercise per Session:   Stress:   . Feeling of Stress :   Social Connections:   . Frequency of Communication with Friends and Family:   . Frequency of Social Gatherings with Friends and Family:   . Attends Religious Services:   . Active Member of Clubs or Organizations:   . Attends Banker Meetings:   Marland Kitchen Marital Status:     Activities of Daily Living In your present state of health, do you have any difficulty performing the following activities: 12/30/2019  Hearing? N  Vision? N  Difficulty concentrating or making decisions? N  Walking or climbing stairs? N  Dressing or bathing? N  Doing errands, shopping? N  Preparing Food and eating ? N  Using the Toilet? N  In the past six months, have you accidently leaked urine? N  Do you have problems with loss of bowel control? N  Managing your Medications? N  Managing your Finances? N  Some recent data might be hidden    Patient Education/ Literacy How often do you need to have someone help you when you read instructions, pamphlets, or other written materials from your doctor or pharmacy?: 1 - Never What is the last grade level you completed in school?: some college  Exercise Current Exercise Habits: The patient does not participate in  regular exercise at present, Exercise limited by: orthopedic condition(s)  Diet Patient reports consuming 3 meals a day and 2 snack(s) a day Patient reports that her primary diet is: Regular Patient reports that she does have regular access to food.   Depression Screen PHQ 2/9 Scores 12/30/2019 11/21/2019  PHQ - 2 Score 0 1  PHQ- 9 Score - 7     Fall Risk Fall Risk  12/30/2019 11/21/2019  Falls in the past year? 0 0     Objective:  [name] seemed alert and oriented and she participated appropriately during our telephone visit.  Blood Pressure Weight BMI  BP Readings from Last 3 Encounters:  11/21/19 109/75  09/07/18 128/74  07/02/17 (!) 84/61   Wt Readings from Last 3 Encounters:  11/21/19 216 lb 12.8 oz (98.3 kg)  09/07/18 250 lb (113.4 kg)  01/31/16 237 lb 3.2 oz (107.6 kg)   BMI Readings from Last 1 Encounters:  11/21/19 33.96 kg/m    *Unable to obtain current vital signs, weight, and BMI due to telephone visit type  Hearing/Vision  . [name] did not seem to have difficulty with hearing/understanding during the telephone conversation . Reports that she has not had a formal eye exam by an eye care professional within the past year . Reports that she has not had a formal hearing evaluation within the past year *Unable to fully assess hearing and vision during telephone visit type  Cognitive Function: 6CIT Screen 12/30/2019  What Year? 0 points  What month? 0 points  What time? 0 points  Count back from 20 0 points  Months in reverse 4 points  Repeat phrase 0 points  Total Score 4   (Normal:0-7, Significant for Dysfunction: >8)  Normal Cognitive Function Screening: Yes   Immunization & Health Maintenance Record Immunization History  Administered Date(s) Administered  . Influenza Inj Mdck Quad Pf 07/05/2016  . Influenza, High Dose Seasonal PF 09/23/2018  . Influenza,inj,Quad PF,6+ Mos 07/03/2019  . Pneumococcal Polysaccharide-23 09/23/2018    Health  Maintenance  Topic Date Due  . MAMMOGRAM  11/20/2011  . DEXA SCAN  11/20/2020 (Originally 10/14/2017)  . Hepatitis C Screening  11/20/2020 (Originally Feb 17, 1951)  . PNA vac Low Risk Adult (2 of 2 - PCV13) 11/20/2020 (Originally 09/24/2019)  . TETANUS/TDAP  11/22/2020 (Originally [birth year]/1971)  . COLONOSCOPY  12/01/2025  . INFLUENZA VACCINE  Completed       Assessment  This is a routine wellness examination for [name].  Health Maintenance: Due or Overdue Health Maintenance Due  Topic Date Due  . MAMMOGRAM  11/20/2011    [name] does not need a referral for Community Assistance: Care Management:   no Social Work:    no Prescription Assistance:  no Nutrition/Diabetes Education:  no   Plan:  Personalized Goals Goals Addressed            This Visit's Progress   . Patient Stated       12/30/2019 AWV Goal: Exercise for General Health   Patient will verbalize understanding of the benefits of increased physical activity:  Exercising regularly is important. It will improve your overall fitness, flexibility, and endurance.  Regular exercise also will improve your overall health. It can help you control your weight, reduce stress, and improve your bone density.  Over the next year, patient will increase physical activity as tolerated with a goal of at least 150 minutes of moderate physical activity per week.   You can tell that you are exercising at a moderate intensity if your heart starts beating faster and you start breathing faster but can still hold a conversation.  Moderate-intensity exercise ideas include:  Walking 1 mile (1.6 km) in about 15 minutes  Biking  Hiking  Golfing  Dancing  Water aerobics  Patient will verbalize understanding of everyday activities that increase physical activity by providing examples like the following: ? Yard work, such as: ? Pushing a Surveyor, mining ? Raking and bagging leaves ? Washing your car ? Pushing a  stroller ? Shoveling snow ? Gardening ? Washing windows or floors  Patient will be able to explain general safety guidelines for exercising:   Before you start a new exercise program, talk with your health care provider.  Do not exercise so much that you hurt yourself, feel dizzy, or get very short of breath.  Wear comfortable clothes and wear shoes with good support.  Drink plenty of water while you exercise to prevent dehydration or heat stroke.  Work out until your breathing and your heartbeat get faster.       Personalized Health Maintenance & Screening Recommendations  Screening mammography  Lung Cancer Screening Recommended: no (Low Dose CT Chest recommended if Age 70-80 years, 30 pack-year currently smoking OR have quit w/in past 15 years) Hepatitis C Screening recommended: no HIV Screening recommended: no  Advanced Directives: Written information was not prepared per patient's request.  Referrals & Orders No orders of the defined types were placed in this encounter.   Follow-up Plan . Follow-up with Molly Fudge, FNP as planned . Schedule mammogram .    I have personally reviewed and noted the following in the patient's chart:   . Medical and social history . Use of alcohol, tobacco or illicit drugs  . Current medications and supplements . Functional ability and status . Nutritional status . Physical activity . Advanced directives . List of other physicians . Hospitalizations, surgeries, and ER visits in previous 12 months . Vitals . Screenings to include cognitive, depression, and falls . Referrals and appointments  In addition, I have reviewed and discussed with Alvino Chapel  A Gaspar certain preventive protocols, quality metrics, and best practice recommendations. A written personalized care plan for preventive services as well as general preventive health recommendations is available and can be mailed to the patient at her request.      Adella Hare,  LPN [id number]

## 2020-01-02 ENCOUNTER — Other Ambulatory Visit: Payer: Self-pay

## 2020-01-02 ENCOUNTER — Ambulatory Visit: Payer: Medicare Other | Admitting: Family Medicine

## 2020-01-02 ENCOUNTER — Ambulatory Visit (INDEPENDENT_AMBULATORY_CARE_PROVIDER_SITE_OTHER): Payer: Medicare Other | Admitting: Family Medicine

## 2020-01-02 ENCOUNTER — Encounter: Payer: Self-pay | Admitting: Family Medicine

## 2020-01-02 VITALS — BP 157/86 | HR 85 | Temp 96.6°F | Ht 67.0 in | Wt 219.2 lb

## 2020-01-02 DIAGNOSIS — K529 Noninfective gastroenteritis and colitis, unspecified: Secondary | ICD-10-CM | POA: Diagnosis not present

## 2020-01-02 DIAGNOSIS — G4700 Insomnia, unspecified: Secondary | ICD-10-CM | POA: Diagnosis not present

## 2020-01-02 DIAGNOSIS — F331 Major depressive disorder, recurrent, moderate: Secondary | ICD-10-CM

## 2020-01-02 DIAGNOSIS — L989 Disorder of the skin and subcutaneous tissue, unspecified: Secondary | ICD-10-CM | POA: Diagnosis not present

## 2020-01-02 DIAGNOSIS — Z23 Encounter for immunization: Secondary | ICD-10-CM

## 2020-01-02 DIAGNOSIS — F411 Generalized anxiety disorder: Secondary | ICD-10-CM

## 2020-01-02 DIAGNOSIS — I69398 Other sequelae of cerebral infarction: Secondary | ICD-10-CM

## 2020-01-02 DIAGNOSIS — G40909 Epilepsy, unspecified, not intractable, without status epilepticus: Secondary | ICD-10-CM

## 2020-01-02 MED ORDER — SACCHAROMYCES BOULARDII 250 MG PO CAPS: 250.0000 mg | ORAL_CAPSULE | Freq: Two times a day (BID) | ORAL | 2 refills | Status: AC

## 2020-01-02 MED ORDER — BUSPIRONE HCL 15 MG PO TABS: 15.0000 mg | ORAL_TABLET | Freq: Two times a day (BID) | ORAL | 2 refills | Status: DC

## 2020-01-02 NOTE — Progress Notes (Signed)
Assessment & Plan:  1. Insomnia, unspecified type - D/C'd Trazodone since patient does not feel it is helping now, nor did it ever. I meant to discuss a sleep study with her before she left but we got off subject. I have tried to call a few times but have been unable to reach her.   2. Chronic diarrhea - Advised to start Benefiber and Florastor.  - saccharomyces boulardii (FLORASTOR) 250 MG capsule; Take 1 capsule (250 mg total) by mouth 2 (two) times daily.  Dispense: 60 capsule; Refill: 2  3. Skin lesion of face - Ambulatory referral to Dermatology  4-5. Generalized anxiety disorder/Major depressive disorder, recurrent episode, moderate (HCC) - Uncontrolled. Increased buspirone.  - busPIRone (BUSPAR) 15 MG tablet; Take 1 tablet (15 mg total) by mouth 2 (two) times daily.  Dispense: 60 tablet; Refill: 2  6. Seizure disorder as sequela of cerebrovascular accident [facility]) - Well controlled on current regimen.   7. Immunization due - Pneumococcal conjugate vaccine 13-valent   Return in about 4 weeks (around 01/30/2020) for additional concerns.  Deliah Boston, MSN, APRN, FNP-C [facility]  Subjective:    Patient ID: [name], female    DOB: 02/09/1951, 69 y.o.   MRN: [id number]  Patient Care Team: Gwenlyn Fudge, FNP as PCP - General (Family Medicine)   Chief Complaint:  Chief Complaint  Patient presents with  . Insomnia    6 week follow up- Patient states it is getting worse   . Diarrhea    ongoing  . spot on face    left cheek- states that it has been there one   . Anxiety    States that it is so bad her hands shake    HPI: [name] is a 69 y.o. female presenting on 01/02/2020 for Insomnia (6 week follow up- Patient states it is getting worse ), Diarrhea (ongoing), spot on face (left cheek- states that it has been there one ), and Anxiety (States that it is so bad her hands shake)  Patient feels like taking trazodone is "like spitting in  the wind".  She has not been taking it recently as she no longer has a prescription for it, but states she was on a higher dose for years.  She did not feel like it was helpful even when she was on the highest dose.    Patient reports she has had diarrhea for a very long time - years.  She takes between 2 and 4 Imodium per day.  Denies constipation even with use of Imodium.  She had a colonoscopy with Dr. Karilyn Cota in 2017 due to the diarrhea.  There was no evidence of endoscopic colitis, random biopsies were taken as well as a 5 mm polyp, and she did have small internal hemorrhoids.  She was instructed to take Imodium 2 mg by mouth before breakfast and once daily and Benefiber 4 g by mouth daily at bedtime.  She was then given dicyclomine 10 mg 3 times daily and advised to follow-up in 6 months.  Patient never went back for follow-up and she states the dicyclomine was not helpful.  Patient is concerned about a spot on her left cheek that has been present for the past year.  She reports it is getting bigger and thicker.  She does not know if there has been any color change.  She did spend a lot of time in the sun with no sunscreen when she lived in [place].  Denies ever lying in a tanning bed.  Patient reports she feels like her anxiety is getting worse.  She is currently taking Cymbalta 60 mg QD, Lamitcal 50 mg BID, Seroquel 100 mg QD, and Buspar 10 mg BID. States she did well when she had Xanax.   Depression screen [facility] 2/9 01/02/2020 12/30/2019 11/21/2019  Decreased Interest 0 0 1  Down, Depressed, Hopeless 0 0 0  PHQ - 2 Score 0 0 1  Altered sleeping 3 - 3  Tired, decreased energy 3 - 0  Change in appetite 3 - 3  Feeling bad or failure about yourself  0 - 0  Trouble concentrating 0 - 0  Moving slowly or fidgety/restless 2 - 0  Suicidal thoughts 0 - 0  PHQ-9 Score 11 - 7  Difficult doing work/chores Somewhat difficult - Somewhat difficult   GAD 7 : Generalized Anxiety Score 01/02/2020 11/21/2019    Nervous, Anxious, on Edge 3 3  Control/stop worrying 1 2  Worry too much - different things 1 1  Trouble relaxing 1 3  Restless 0 0  Easily annoyed or irritable 0 1  Afraid - awful might happen 0 0  Total GAD 7 Score 6 10  Anxiety Difficulty Somewhat difficult Somewhat difficult    New complaints: Patient reports she is decided to go forward with surgery at [facility].  She has one of her doctors cards and states he asked her to give it to me so that I could give him a call (Dr. Conchita Paris).    Social history:  Relevant past medical, surgical, family and social history reviewed and updated as indicated. Interim medical history since our last visit reviewed.  Allergies and medications reviewed and updated.  DATA REVIEWED: CHART IN EPIC  ROS: Negative unless specifically indicated above in HPI.    Current Outpatient Medications:  .  allopurinol (ZYLOPRIM) 300 MG tablet, Take 300 mg by mouth daily., Disp: , Rfl: 12 .  atorvastatin (LIPITOR) 40 MG tablet, Take 40 mg by mouth at bedtime., Disp: , Rfl: 12 .  busPIRone (BUSPAR) 15 MG tablet, Take 1 tablet (15 mg total) by mouth 2 (two) times daily., Disp: 60 tablet, Rfl: 2 .  calcium-vitamin D (OSCAL WITH D) 500-200 MG-UNIT tablet, Take 1 tablet by mouth 2 (two) times daily., Disp: , Rfl:  .  Cholecalciferol (VITAMIN D3) 25 MCG (1000 UT) CAPS, Take by mouth., Disp: , Rfl:  .  DULoxetine (CYMBALTA) 60 MG capsule, Take 60 mg by mouth every morning., Disp: , Rfl: 2 .  furosemide (LASIX) 40 MG tablet, Take 40 mg by mouth daily., Disp: , Rfl: 12 .  lamoTRIgine (LAMICTAL) 25 MG tablet, Take 50 mg by mouth 2 (two) times daily., Disp: , Rfl: 12 .  meloxicam (MOBIC) 7.5 MG tablet, Take 7.5 mg by mouth 2 (two) times daily., Disp: , Rfl: 12 .  OVER THE COUNTER MEDICATION, Daly- vite tablet, Disp: , Rfl:  .  pantoprazole (PROTONIX) 40 MG tablet, Take 40 mg by mouth 2 (two) times daily., Disp: , Rfl: 12 .  potassium chloride  SA (K-DUR,KLOR-CON) 20 MEQ tablet, Take 20 mEq by mouth daily., Disp: , Rfl: 12 .  QUEtiapine (SEROQUEL) 100 MG tablet, Take 100 mg by mouth daily., Disp: , Rfl: 12 .  saccharomyces boulardii (FLORASTOR) 250 MG capsule, Take 1 capsule (250 mg total) by mouth 2 (two) times daily., Disp: 60 capsule, Rfl: 2   No Known Allergies Past Medical History:  Diagnosis Date  .  Alcohol abuse   . Anxiety   . Arthritis   . Chronic back pain    radiating to bilat legs/weakness  . Chronic knee pain   . Compression fracture of T11 vertebra (HCC)   . Edema   . Gout   . Hemiplegia and hemiparesis following cerebral infarction affecting right dominant side (HCC)   . History of dental problems   . Hypertension   . Insomnia, unspecified   . Low back pain   . Major depressive disorder, single episode, moderate (HCC)   . PAF (paroxysmal atrial fibrillation) (HCC)   . Pain in joint involving shoulder region   . Panic disorder without agoraphobia   . Pelvic pain   . Seizures (HCC)   . Spinal stenosis, lumbar   . 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 Subarachnoid hemorrhage (HCC)   . Unspecified sequelae of unspecified cerebrovascular disease     Past Surgical History:  Procedure Laterality Date  . BARIATRIC SURGERY    . BIOPSY N/A 12/02/2015   Procedure: BIOPSY;  Surgeon: Malissa Hippo, MD;  [facility];  Service: Endoscopy;  Laterality: N/A;  Random colon biopsies  . CHOLECYSTECTOMY    . COLONOSCOPY    . COLONOSCOPY N/A 12/02/2015   Procedure: COLONOSCOPY;  Surgeon: Malissa Hippo, MD;  [facility];  Service: Endoscopy;  Laterality: N/A;  110  . EYE SURGERY     as a child  . GASTRIC BYPASS     in 1985 for obesity  . POLYPECTOMY N/A 12/02/2015   Procedure: POLYPECTOMY;  Surgeon: Malissa Hippo, MD;  [facility];  Service: Endoscopy;  Laterality: N/A;  Splenic flexure polyp  . right  foot surgery      Social History   Socioeconomic History  . Marital status: Married    Spouse name: Jake Shark  . Number of children: 1  . Years of education: 62  . Highest education level: Some college, no degree  Occupational History  . Occupation: retired    Comment: Designer, fashion/clothing, cna  Tobacco Use  . Smoking status: Former Smoker    Types: Cigarettes  . Smokeless tobacco: Never Used  . Tobacco comment: Patient has quit but not sure when   Substance and Sexual Activity  . Alcohol use: No    Alcohol/week: 0.0 standard drinks  . Drug use: No  . Sexual activity: Not Currently  Other Topics Concern  . Not on file  Social History Narrative  . Not on file   Social Determinants of Health   Financial Resource Strain:   . Difficulty of Paying Living Expenses:   Food Insecurity:   . Worried About Programme researcher, broadcasting/film/video in the Last Year:   . Barista in the Last Year:   Transportation Needs:   . Freight forwarder (Medical):   Marland Kitchen Lack of Transportation (Non-Medical):   Physical Activity:   . Days of Exercise per Week:   . Minutes of Exercise per Session:   Stress:   . Feeling of Stress :   Social Connections:   . Frequency of Communication with Friends and Family:   . Frequency of Social Gatherings with Friends and Family:   . Attends Religious Services:   . Active Member of Clubs or Organizations:   . Attends Banker Meetings:   Marland Kitchen Marital Status:   Intimate Partner Violence:   .  Fear of Current or Ex-Partner:   . Emotionally Abused:   Marland Kitchen Physically Abused:   . Sexually Abused:         Objective:    BP (!) 157/86   Pulse 85   Temp (!) 96.6 F (35.9 C) (Temporal)   Ht 5\' 7"  (1.702 m)   Wt 219 lb 3.2 oz (99.4 kg)   SpO2 94%   BMI 34.33 kg/m   Wt Readings from Last 3 Encounters:  01/02/20 219 lb 3.2 oz (99.4 kg)  11/21/19 216 lb 12.8 oz (98.3 kg)  09/07/18 250 lb (113.4 kg)    Physical Exam Vitals reviewed.  Constitutional:      General:  She is not in acute distress.    Appearance: Normal appearance. She is obese. She is not ill-appearing, toxic-appearing or diaphoretic.  HENT:     Head: Normocephalic and atraumatic.  Eyes:     General: No scleral icterus.       Right eye: No discharge.        Left eye: No discharge.     Conjunctiva/sclera: Conjunctivae normal.  Cardiovascular:     Rate and Rhythm: Normal rate and regular rhythm.     Heart sounds: Normal heart sounds. No murmur. No friction rub. No gallop.   Pulmonary:     Effort: Pulmonary effort is normal. No respiratory distress.     Breath sounds: Normal breath sounds. No stridor. No wheezing, rhonchi or rales.  Musculoskeletal:        General: Normal range of motion.     Cervical back: Normal range of motion.  Skin:    General: Skin is warm and dry.     Capillary Refill: Capillary refill takes less than 2 seconds.     Findings: Lesion (flaky, irregular to left cheek) present.  Neurological:     General: No focal deficit present.     Mental Status: She is alert and oriented to person, place, and time. Mental status is at baseline.  Psychiatric:        Mood and Affect: Mood normal.        Behavior: Behavior normal.        Thought Content: Thought content normal.        Judgment: Judgment normal.     Lab Results  Component Value Date   TSH 2.83 05/18/2017   Lab Results  Component Value Date   WBC 7.4 11/21/2019   HGB 12.8 11/21/2019   HCT 39.3 11/21/2019   MCV 93 11/21/2019   PLT 273 11/21/2019   Lab Results  Component Value Date   NA 144 11/21/2019   K 4.2 11/21/2019   CO2 20 11/21/2019   GLUCOSE 106 (H) 11/21/2019   BUN 17 11/21/2019   CREATININE 0.88 11/21/2019   BILITOT 0.3 11/21/2019   ALKPHOS 136 (H) 11/21/2019   AST 22 11/21/2019   ALT 18 11/21/2019   PROT 6.4 11/21/2019   ALBUMIN 4.1 11/21/2019   CALCIUM 9.3 11/21/2019   Lab Results  Component Value Date   CHOL 170 11/21/2019   Lab Results  Component Value Date   HDL 57  11/21/2019   Lab Results  Component Value Date   LDLCALC 65 11/21/2019   Lab Results  Component Value Date   TRIG 305 (H) 11/21/2019   Lab Results  Component Value Date   CHOLHDL 3.0 11/21/2019   Lab Results  Component Value Date   HGBA1C 5.6 08/21/2013

## 2020-01-02 NOTE — Patient Instructions (Signed)
Buy some Benefiber gummies over the counter to take as directed on bottle.

## 2020-01-03 DIAGNOSIS — F331 Major depressive disorder, recurrent, moderate: Secondary | ICD-10-CM | POA: Insufficient documentation

## 2020-01-04 ENCOUNTER — Encounter: Payer: Self-pay | Admitting: Family Medicine

## 2020-01-05 NOTE — Progress Notes (Signed)
Left detailed message for pharmacy to fax over list medications they are filling.

## 2020-01-19 ENCOUNTER — Encounter: Payer: Self-pay | Admitting: *Deleted

## 2020-01-19 ENCOUNTER — Ambulatory Visit: Payer: Medicare Other | Admitting: Family Medicine

## 2020-01-22 ENCOUNTER — Telehealth: Payer: Self-pay | Admitting: Family Medicine

## 2020-01-22 MED ORDER — POTASSIUM CHLORIDE CRYS ER 20 MEQ PO TBCR: 20.0000 meq | EXTENDED_RELEASE_TABLET | Freq: Every day | ORAL | 2 refills | Status: AC

## 2020-01-22 MED ORDER — MELOXICAM 7.5 MG PO TABS: 7.5000 mg | ORAL_TABLET | Freq: Two times a day (BID) | ORAL | 2 refills | Status: DC

## 2020-01-22 MED ORDER — DULOXETINE HCL 60 MG PO CPEP: 60.0000 mg | ORAL_CAPSULE | Freq: Every morning | ORAL | 2 refills | Status: AC

## 2020-01-22 MED ORDER — FUROSEMIDE 40 MG PO TABS: 40.0000 mg | ORAL_TABLET | Freq: Every day | ORAL | 2 refills | Status: DC

## 2020-01-22 MED ORDER — PANTOPRAZOLE SODIUM 40 MG PO TBEC: 40.0000 mg | DELAYED_RELEASE_TABLET | Freq: Two times a day (BID) | ORAL | 2 refills | Status: DC

## 2020-01-22 MED ORDER — QUETIAPINE FUMARATE 100 MG PO TABS: 100.0000 mg | ORAL_TABLET | Freq: Every day | ORAL | 2 refills | Status: DC

## 2020-01-22 NOTE — Telephone Encounter (Signed)
Medications refilled

## 2020-01-22 NOTE — Telephone Encounter (Signed)
  Prescription Request  01/22/2020  What is the name of the medication or equipment?  pantoprazole (PROTONIX) 40 MG tablet potassium chloride SA (K-DUR,KLOR-CON) 20 MEQ tablet furosemide (LASIX) 40 MG tablet meloxicam (MOBIC) 7.5 MG tablet DULoxetine (CYMBALTA) 60 MG capsule QUEtiapine (SEROQUEL) 100 MG tablet  Have you contacted your pharmacy to request a refill? (if applicable) No--Cassie from pharmacy called office  Which pharmacy would you like this sent to? [facility]   Patient notified that their request is being sent to the clinical staff for review and that they should receive a response within 2 business days.

## 2020-01-22 NOTE — Telephone Encounter (Signed)
Patient aware, per message left on her voice mail,   script are ready.

## 2020-01-22 NOTE — Telephone Encounter (Signed)
February 5 th lab work was done . Please advise on refills.

## 2020-02-02 NOTE — Progress Notes (Deleted)
Assessment & Plan:  ***  No follow-ups on file.  Molly Boston, [name], [name], [name] [facility]  Subjective:    Patient ID: [name], female    DOB: 28-May-1951, 69 y.o.   MRN: [id number]  Patient Care Team: Gwenlyn Fudge, FNP as PCP - General (Family Medicine)   Chief Complaint: No chief complaint on file.   HPI: [name] is a 69 y.o. female presenting on 02/03/2020 for No chief complaint on file.  Discuss sleep study with patient.  Upon review of medical records it appears patient complained of snoring, daytime sleepiness, restless leg syndrome, and insomnia to her previous PCP.  It is unclear if a sleep study was ever performed.  New complaints: ***  Social history:  Relevant past medical, surgical, family and social history reviewed and updated as indicated. Interim medical history since our last visit reviewed.  Allergies and medications reviewed and updated.  DATA REVIEWED: CHART IN EPIC  ROS: Negative unless specifically indicated above in HPI.    Current Outpatient Medications:  .  allopurinol (ZYLOPRIM) 300 MG tablet, Take 300 mg by mouth daily., Disp: , Rfl: 12 .  atorvastatin (LIPITOR) 40 MG tablet, Take 40 mg by mouth at bedtime., Disp: , Rfl: 12 .  busPIRone (BUSPAR) 15 MG tablet, Take 1 tablet (15 mg total) by mouth 2 (two) times daily., Disp: 60 tablet, Rfl: 2 .  calcium-vitamin D (OSCAL WITH D) 500-200 MG-UNIT tablet, Take 1 tablet by mouth 2 (two) times daily., Disp: , Rfl:  .  Cholecalciferol (VITAMIN D3) 25 MCG (1000 UT) CAPS, Take by mouth., Disp: , Rfl:  .  DULoxetine (CYMBALTA) 60 MG capsule, Take 1 capsule (60 mg total) by mouth every morning., Disp: 90 capsule, Rfl: 2 .  furosemide (LASIX) 40 MG tablet, Take 1 tablet (40 mg total) by mouth daily., Disp: 90 tablet, Rfl: 2 .  lamoTRIgine (LAMICTAL) 25 MG tablet, Take 50 mg by mouth 2 (two) times daily., Disp: , Rfl: 12 .  meloxicam (MOBIC) 7.5 MG tablet, Take 1 tablet  (7.5 mg total) by mouth 2 (two) times daily., Disp: 180 tablet, Rfl: 2 .  OVER THE COUNTER MEDICATION, Daly- vite tablet, Disp: , Rfl:  .  pantoprazole (PROTONIX) 40 MG tablet, Take 1 tablet (40 mg total) by mouth 2 (two) times daily., Disp: 180 tablet, Rfl: 2 .  potassium chloride SA (KLOR-CON) 20 MEQ tablet, Take 1 tablet (20 mEq total) by mouth daily., Disp: 90 tablet, Rfl: 2 .  QUEtiapine (SEROQUEL) 100 MG tablet, Take 1 tablet (100 mg total) by mouth daily., Disp: 90 tablet, Rfl: 2 .  saccharomyces boulardii (FLORASTOR) 250 MG capsule, Take 1 capsule (250 mg total) by mouth 2 (two) times daily., Disp: 60 capsule, Rfl: 2   No Known Allergies Past Medical History:  Diagnosis Date  . Alcohol abuse   . Anxiety   . Arthritis   . Chronic back pain    radiating to bilat legs/weakness  . Chronic knee pain   . Compression fracture of T11 vertebra (HCC)   . Edema   . Gout   . Hemiplegia and hemiparesis following cerebral infarction affecting right dominant side (HCC)   . History of dental problems   . Hypertension   . Insomnia, unspecified   . Low back pain   . Major depressive disorder, single episode, moderate (HCC)   . PAF (paroxysmal atrial fibrillation) (HCC)   . Pain in joint involving shoulder region   .  Panic disorder without agoraphobia   . Pelvic pain   . Seizures (HCC)   . Spinal stenosis, lumbar   . 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 Subarachnoid hemorrhage (HCC)   . Unspecified sequelae of unspecified cerebrovascular disease     Past Surgical History:  Procedure Laterality Date  . BARIATRIC SURGERY    . BIOPSY N/A 12/02/2015   Procedure: BIOPSY;  Surgeon: Malissa Hippo, MD;  [facility];  Service: Endoscopy;  Laterality: N/A;  Random colon biopsies  . CHOLECYSTECTOMY    . COLONOSCOPY    . COLONOSCOPY N/A 12/02/2015   Procedure: COLONOSCOPY;  Surgeon: Malissa Hippo, MD;   [facility];  Service: Endoscopy;  Laterality: N/A;  110  . EYE SURGERY     as a child  . GASTRIC BYPASS     in 1985 for obesity  . POLYPECTOMY N/A 12/02/2015   Procedure: POLYPECTOMY;  Surgeon: Malissa Hippo, MD;  [facility];  Service: Endoscopy;  Laterality: N/A;  Splenic flexure polyp  . right foot surgery      Social History   Socioeconomic History  . Marital status: Married    Spouse name: Jake Shark  . Number of children: 1  . Years of education: 62  . Highest education level: Some college, no degree  Occupational History  . Occupation: retired    Comment: Designer, fashion/clothing, cna  Tobacco Use  . Smoking status: Former Smoker    Types: Cigarettes  . Smokeless tobacco: Never Used  . Tobacco comment: Patient has quit but not sure when   Substance and Sexual Activity  . Alcohol use: No    Alcohol/week: 0.0 standard drinks  . Drug use: No  . Sexual activity: Not Currently  Other Topics Concern  . Not on file  Social History Narrative  . Not on file   Social Determinants of Health   Financial Resource Strain:   . Difficulty of Paying Living Expenses:   Food Insecurity:   . Worried About Programme researcher, broadcasting/film/video in the Last Year:   . Barista in the Last Year:   Transportation Needs:   . Freight forwarder (Medical):   Marland Kitchen Lack of Transportation (Non-Medical):   Physical Activity:   . Days of Exercise per Week:   . Minutes of Exercise per Session:   Stress:   . Feeling of Stress :   Social Connections:   . Frequency of Communication with Friends and Family:   . Frequency of Social Gatherings with Friends and Family:   . Attends Religious Services:   . Active Member of Clubs or Organizations:   . Attends Banker Meetings:   Marland Kitchen Marital Status:   Intimate Partner Violence:   . Fear of Current or Ex-Partner:   . Emotionally Abused:   Marland Kitchen Physically Abused:   . Sexually Abused:         Objective:    There were no vitals taken for  this visit.  Wt Readings from Last 3 Encounters:  01/02/20 219 lb 3.2 oz (99.4 kg)  11/21/19 216 lb 12.8 oz (98.3 kg)  09/07/18 250 lb (113.4 kg)    Physical Exam  Lab Results  Component Value Date   TSH 2.83 05/18/2017   Lab Results  Component Value Date   WBC 7.4 11/21/2019   HGB 12.8 11/21/2019   HCT 39.3 11/21/2019  MCV 93 11/21/2019   PLT 273 11/21/2019   Lab Results  Component Value Date   NA 144 11/21/2019   K 4.2 11/21/2019   CO2 20 11/21/2019   GLUCOSE 106 (H) 11/21/2019   BUN 17 11/21/2019   CREATININE 0.88 11/21/2019   BILITOT 0.3 11/21/2019   ALKPHOS 136 (H) 11/21/2019   AST 22 11/21/2019   ALT 18 11/21/2019   PROT 6.4 11/21/2019   ALBUMIN 4.1 11/21/2019   CALCIUM 9.3 11/21/2019   Lab Results  Component Value Date   CHOL 170 11/21/2019   Lab Results  Component Value Date   HDL 57 11/21/2019   Lab Results  Component Value Date   LDLCALC 65 11/21/2019   Lab Results  Component Value Date   TRIG 305 (H) 11/21/2019   Lab Results  Component Value Date   CHOLHDL 3.0 11/21/2019   Lab Results  Component Value Date   HGBA1C 5.6 08/21/2013

## 2020-02-03 ENCOUNTER — Ambulatory Visit: Payer: Medicare Other | Admitting: Family Medicine

## 2020-02-05 ENCOUNTER — Encounter: Payer: Self-pay | Admitting: Family Medicine

## 2020-02-12 ENCOUNTER — Ambulatory Visit: Payer: Medicare Other | Admitting: Family Medicine

## 2020-02-12 ENCOUNTER — Encounter: Payer: Self-pay | Admitting: Family Medicine

## 2020-02-17 DIAGNOSIS — I10 Essential (primary) hypertension: Secondary | ICD-10-CM | POA: Diagnosis not present

## 2020-02-17 DIAGNOSIS — M10029 Idiopathic gout, unspecified elbow: Secondary | ICD-10-CM | POA: Diagnosis not present

## 2020-02-17 DIAGNOSIS — K219 Gastro-esophageal reflux disease without esophagitis: Secondary | ICD-10-CM | POA: Diagnosis not present

## 2020-02-17 DIAGNOSIS — E785 Hyperlipidemia, unspecified: Secondary | ICD-10-CM | POA: Diagnosis not present

## 2020-02-17 DIAGNOSIS — M545 Low back pain: Secondary | ICD-10-CM | POA: Diagnosis not present

## 2020-02-27 DIAGNOSIS — Z79899 Other long term (current) drug therapy: Secondary | ICD-10-CM | POA: Diagnosis not present

## 2020-02-27 DIAGNOSIS — D519 Vitamin B12 deficiency anemia, unspecified: Secondary | ICD-10-CM | POA: Diagnosis not present

## 2020-02-27 DIAGNOSIS — M545 Low back pain: Secondary | ICD-10-CM | POA: Diagnosis not present

## 2020-02-27 DIAGNOSIS — K219 Gastro-esophageal reflux disease without esophagitis: Secondary | ICD-10-CM | POA: Diagnosis not present

## 2020-02-27 DIAGNOSIS — E559 Vitamin D deficiency, unspecified: Secondary | ICD-10-CM | POA: Diagnosis not present

## 2020-02-27 DIAGNOSIS — I10 Essential (primary) hypertension: Secondary | ICD-10-CM | POA: Diagnosis not present

## 2020-02-27 DIAGNOSIS — M10029 Idiopathic gout, unspecified elbow: Secondary | ICD-10-CM | POA: Diagnosis not present

## 2020-02-27 DIAGNOSIS — E785 Hyperlipidemia, unspecified: Secondary | ICD-10-CM | POA: Diagnosis not present

## 2020-03-29 DIAGNOSIS — R0602 Shortness of breath: Secondary | ICD-10-CM | POA: Diagnosis not present

## 2020-03-29 DIAGNOSIS — M25569 Pain in unspecified knee: Secondary | ICD-10-CM | POA: Diagnosis not present

## 2020-03-29 DIAGNOSIS — M545 Low back pain: Secondary | ICD-10-CM | POA: Diagnosis not present

## 2020-03-29 DIAGNOSIS — R5383 Other fatigue: Secondary | ICD-10-CM | POA: Diagnosis not present

## 2020-04-03 DIAGNOSIS — H40033 Anatomical narrow angle, bilateral: Secondary | ICD-10-CM | POA: Diagnosis not present

## 2020-04-03 DIAGNOSIS — H2513 Age-related nuclear cataract, bilateral: Secondary | ICD-10-CM | POA: Diagnosis not present

## 2020-04-08 ENCOUNTER — Other Ambulatory Visit: Payer: Self-pay | Admitting: Family Medicine

## 2020-04-08 DIAGNOSIS — F411 Generalized anxiety disorder: Secondary | ICD-10-CM

## 2020-04-14 ENCOUNTER — Other Ambulatory Visit: Payer: Self-pay | Admitting: Family Medicine

## 2020-04-14 DIAGNOSIS — F411 Generalized anxiety disorder: Secondary | ICD-10-CM

## 2020-04-14 NOTE — Telephone Encounter (Signed)
ntbs for refills on Buspar Pt has cancelled or No Showed the last few appts

## 2020-04-15 ENCOUNTER — Other Ambulatory Visit: Payer: Self-pay | Admitting: Family Medicine

## 2020-04-15 DIAGNOSIS — F411 Generalized anxiety disorder: Secondary | ICD-10-CM

## 2020-04-16 NOTE — Telephone Encounter (Signed)
lmtcb to schedule follow up appt

## 2020-05-06 ENCOUNTER — Other Ambulatory Visit: Payer: Self-pay | Admitting: Family Medicine

## 2020-05-06 DIAGNOSIS — F411 Generalized anxiety disorder: Secondary | ICD-10-CM

## 2020-05-06 NOTE — Telephone Encounter (Signed)
Last OV 01/02/20-has cancelled or No Show last couple of visits  Needs appointment for refills

## 2020-05-06 NOTE — Telephone Encounter (Signed)
Attempted to contact patient - NA °

## 2020-05-20 DIAGNOSIS — E785 Hyperlipidemia, unspecified: Secondary | ICD-10-CM | POA: Diagnosis not present

## 2020-05-20 DIAGNOSIS — K219 Gastro-esophageal reflux disease without esophagitis: Secondary | ICD-10-CM | POA: Diagnosis not present

## 2020-05-20 DIAGNOSIS — M10029 Idiopathic gout, unspecified elbow: Secondary | ICD-10-CM | POA: Diagnosis not present

## 2020-05-20 DIAGNOSIS — I10 Essential (primary) hypertension: Secondary | ICD-10-CM | POA: Diagnosis not present

## 2020-05-20 DIAGNOSIS — M545 Low back pain: Secondary | ICD-10-CM | POA: Diagnosis not present

## 2020-05-25 DIAGNOSIS — M545 Low back pain: Secondary | ICD-10-CM | POA: Diagnosis not present

## 2020-05-25 DIAGNOSIS — R5383 Other fatigue: Secondary | ICD-10-CM | POA: Diagnosis not present

## 2020-05-25 DIAGNOSIS — M2550 Pain in unspecified joint: Secondary | ICD-10-CM | POA: Diagnosis not present

## 2020-06-26 DIAGNOSIS — R404 Transient alteration of awareness: Secondary | ICD-10-CM | POA: Diagnosis not present

## 2020-06-26 DIAGNOSIS — Z79899 Other long term (current) drug therapy: Secondary | ICD-10-CM | POA: Diagnosis not present

## 2020-06-26 DIAGNOSIS — Z8673 Personal history of transient ischemic attack (TIA), and cerebral infarction without residual deficits: Secondary | ICD-10-CM | POA: Diagnosis not present

## 2020-06-26 DIAGNOSIS — Z7982 Long term (current) use of aspirin: Secondary | ICD-10-CM | POA: Diagnosis not present

## 2020-06-26 DIAGNOSIS — R001 Bradycardia, unspecified: Secondary | ICD-10-CM | POA: Diagnosis not present

## 2020-06-26 DIAGNOSIS — G459 Transient cerebral ischemic attack, unspecified: Secondary | ICD-10-CM | POA: Diagnosis not present

## 2020-06-26 DIAGNOSIS — I959 Hypotension, unspecified: Secondary | ICD-10-CM | POA: Diagnosis not present

## 2020-06-26 DIAGNOSIS — R1310 Dysphagia, unspecified: Secondary | ICD-10-CM | POA: Diagnosis not present

## 2020-06-26 DIAGNOSIS — E876 Hypokalemia: Secondary | ICD-10-CM | POA: Diagnosis not present

## 2020-06-26 DIAGNOSIS — R410 Disorientation, unspecified: Secondary | ICD-10-CM | POA: Diagnosis not present

## 2020-06-26 DIAGNOSIS — R0689 Other abnormalities of breathing: Secondary | ICD-10-CM | POA: Diagnosis not present

## 2020-06-26 DIAGNOSIS — R262 Difficulty in walking, not elsewhere classified: Secondary | ICD-10-CM | POA: Diagnosis not present

## 2020-06-26 DIAGNOSIS — E870 Hyperosmolality and hypernatremia: Secondary | ICD-10-CM | POA: Diagnosis not present

## 2020-06-27 DIAGNOSIS — R410 Disorientation, unspecified: Secondary | ICD-10-CM | POA: Diagnosis not present

## 2020-06-27 DIAGNOSIS — E876 Hypokalemia: Secondary | ICD-10-CM | POA: Diagnosis not present

## 2020-06-27 DIAGNOSIS — E871 Hypo-osmolality and hyponatremia: Secondary | ICD-10-CM | POA: Diagnosis not present

## 2020-06-28 DIAGNOSIS — Z8673 Personal history of transient ischemic attack (TIA), and cerebral infarction without residual deficits: Secondary | ICD-10-CM | POA: Diagnosis not present

## 2020-06-28 DIAGNOSIS — I6389 Other cerebral infarction: Secondary | ICD-10-CM | POA: Diagnosis not present

## 2020-06-28 DIAGNOSIS — I618 Other nontraumatic intracerebral hemorrhage: Secondary | ICD-10-CM | POA: Diagnosis not present

## 2020-06-28 DIAGNOSIS — E876 Hypokalemia: Secondary | ICD-10-CM | POA: Diagnosis not present

## 2020-06-28 DIAGNOSIS — R410 Disorientation, unspecified: Secondary | ICD-10-CM | POA: Diagnosis not present

## 2020-06-29 ENCOUNTER — Telehealth: Payer: Self-pay | Admitting: *Deleted

## 2020-06-29 NOTE — Telephone Encounter (Signed)
    Transitional Care Management  Contact Attempt Attempt Date:06/29/2020 Attempted By: Zannie Cove, LPN    1st unsuccessful TCM contact attempt.   I reached out to [name] on her preferred telephone number to discuss Transitional Care Management, medication reconciliation, and to schedule a TCM hospital follow-up with her PCP at [facility].  Discharge Date: 06/28/20  Location: [facility] ER to Admission   Discharge Dx: altered mental status  Recommendations for Outpatient Follow-up:  Follow up with PCP at [place] I left a HIPAA compliant message for her to return my call.  Will attempt to contact again within the 2 business day post discharge window if she does not return my call.

## 2020-06-30 NOTE — Telephone Encounter (Signed)
    Transitional Care Management  Contact Attempt Attempt Date:06/30/2020 Attempted By: Izetta Dakin, lpn   2nd unsuccessful TCM contact attempt.   I reached out to Ronne Binning on her preferred telephone number to discuss Transitional Care Management, medication reconciliation, and to schedule a TCM hospital follow-up with her PCP at [facility].  Discharge Date: 06/28/20  Location: [facility] - admission  Discharge Dx: altered mental status  Recommendations for Outpatient Follow-up:  Follow up with PCP at [place] I left a HIPAA compliant message for her to return my call.  Will attempt to contact again within the 2 business day post discharge window if she does not return my call.

## 2020-07-22 DIAGNOSIS — L600 Ingrowing nail: Secondary | ICD-10-CM | POA: Diagnosis not present

## 2020-08-11 DIAGNOSIS — L600 Ingrowing nail: Secondary | ICD-10-CM | POA: Diagnosis not present

## 2020-08-11 DIAGNOSIS — M10029 Idiopathic gout, unspecified elbow: Secondary | ICD-10-CM | POA: Diagnosis not present

## 2020-08-11 DIAGNOSIS — R5383 Other fatigue: Secondary | ICD-10-CM | POA: Diagnosis not present

## 2020-08-11 DIAGNOSIS — Z79899 Other long term (current) drug therapy: Secondary | ICD-10-CM | POA: Diagnosis not present

## 2020-08-11 DIAGNOSIS — E785 Hyperlipidemia, unspecified: Secondary | ICD-10-CM | POA: Diagnosis not present

## 2020-08-11 DIAGNOSIS — K219 Gastro-esophageal reflux disease without esophagitis: Secondary | ICD-10-CM | POA: Diagnosis not present

## 2020-08-11 DIAGNOSIS — Z0000 Encounter for general adult medical examination without abnormal findings: Secondary | ICD-10-CM | POA: Diagnosis not present

## 2020-08-23 DIAGNOSIS — M2550 Pain in unspecified joint: Secondary | ICD-10-CM | POA: Diagnosis not present

## 2020-08-23 DIAGNOSIS — M25569 Pain in unspecified knee: Secondary | ICD-10-CM | POA: Diagnosis not present

## 2020-08-23 DIAGNOSIS — R5383 Other fatigue: Secondary | ICD-10-CM | POA: Diagnosis not present

## 2020-09-15 DIAGNOSIS — M47816 Spondylosis without myelopathy or radiculopathy, lumbar region: Secondary | ICD-10-CM | POA: Diagnosis not present

## 2020-09-15 DIAGNOSIS — M48062 Spinal stenosis, lumbar region with neurogenic claudication: Secondary | ICD-10-CM | POA: Diagnosis not present

## 2020-09-29 ENCOUNTER — Telehealth: Payer: Self-pay

## 2020-09-29 NOTE — Telephone Encounter (Signed)
Per Dr. Darnell Level pt needs to be scheduled in next available spot with any provider. Tiffany or Ardeen Fillers will have something sooner.   Called pt. No answer. Left message for pt to return call. Form is at [employer].

## 2020-10-04 ENCOUNTER — Other Ambulatory Visit: Payer: Self-pay | Admitting: Family Medicine

## 2020-10-07 NOTE — Telephone Encounter (Signed)
Lmtcb  No call back - this encounter will be closed  

## 2020-10-10 ENCOUNTER — Emergency Department (HOSPITAL_COMMUNITY): Payer: Medicare Other

## 2020-10-10 ENCOUNTER — Encounter (HOSPITAL_COMMUNITY): Payer: Self-pay

## 2020-10-10 ENCOUNTER — Observation Stay (HOSPITAL_COMMUNITY)
Admission: EM | Admit: 2020-10-10 | Discharge: 2020-10-12 | Disposition: A | Discharge status: Home / Self Care | Payer: Medicare Other | Attending: Family Medicine | Admitting: Family Medicine

## 2020-10-10 ENCOUNTER — Other Ambulatory Visit: Payer: Self-pay

## 2020-10-10 DIAGNOSIS — R000 Tachycardia, unspecified: Secondary | ICD-10-CM | POA: Diagnosis not present

## 2020-10-10 DIAGNOSIS — Z20822 Contact with and (suspected) exposure to covid-19: Secondary | ICD-10-CM | POA: Diagnosis not present

## 2020-10-10 DIAGNOSIS — Z79899 Other long term (current) drug therapy: Secondary | ICD-10-CM | POA: Diagnosis not present

## 2020-10-10 DIAGNOSIS — N3000 Acute cystitis without hematuria: Secondary | ICD-10-CM | POA: Insufficient documentation

## 2020-10-10 DIAGNOSIS — R58 Hemorrhage, not elsewhere classified: Secondary | ICD-10-CM | POA: Diagnosis not present

## 2020-10-10 DIAGNOSIS — R4182 Altered mental status, unspecified: Principal | ICD-10-CM | POA: Insufficient documentation

## 2020-10-10 DIAGNOSIS — I480 Paroxysmal atrial fibrillation: Secondary | ICD-10-CM | POA: Diagnosis present

## 2020-10-10 DIAGNOSIS — I10 Essential (primary) hypertension: Secondary | ICD-10-CM | POA: Diagnosis present

## 2020-10-10 DIAGNOSIS — I69351 Hemiplegia and hemiparesis following cerebral infarction affecting right dominant side: Secondary | ICD-10-CM

## 2020-10-10 DIAGNOSIS — G9341 Metabolic encephalopathy: Secondary | ICD-10-CM | POA: Diagnosis not present

## 2020-10-10 DIAGNOSIS — I5032 Chronic diastolic (congestive) heart failure: Secondary | ICD-10-CM | POA: Diagnosis not present

## 2020-10-10 DIAGNOSIS — I110 Hypertensive heart disease with heart failure: Secondary | ICD-10-CM | POA: Insufficient documentation

## 2020-10-10 DIAGNOSIS — R404 Transient alteration of awareness: Secondary | ICD-10-CM | POA: Diagnosis not present

## 2020-10-10 DIAGNOSIS — R0902 Hypoxemia: Secondary | ICD-10-CM | POA: Diagnosis not present

## 2020-10-10 DIAGNOSIS — G40909 Epilepsy, unspecified, not intractable, without status epilepticus: Secondary | ICD-10-CM

## 2020-10-10 DIAGNOSIS — G459 Transient cerebral ischemic attack, unspecified: Secondary | ICD-10-CM | POA: Diagnosis not present

## 2020-10-10 DIAGNOSIS — F331 Major depressive disorder, recurrent, moderate: Secondary | ICD-10-CM | POA: Diagnosis present

## 2020-10-10 DIAGNOSIS — Z8673 Personal history of transient ischemic attack (TIA), and cerebral infarction without residual deficits: Secondary | ICD-10-CM

## 2020-10-10 DIAGNOSIS — N390 Urinary tract infection, site not specified: Secondary | ICD-10-CM | POA: Diagnosis present

## 2020-10-10 DIAGNOSIS — R4781 Slurred speech: Secondary | ICD-10-CM | POA: Diagnosis not present

## 2020-10-10 DIAGNOSIS — Z87891 Personal history of nicotine dependence: Secondary | ICD-10-CM | POA: Diagnosis not present

## 2020-10-10 HISTORY — DX: Nontraumatic intracerebral hemorrhage, unspecified: I61.9

## 2020-10-10 LAB — DIFFERENTIAL
Abs Immature Granulocytes: 0.01 10*3/uL (ref 0.00–0.07)
Basophils Absolute: 0 10*3/uL (ref 0.0–0.1)
Basophils Relative: 1 %
Eosinophils Absolute: 0.1 10*3/uL (ref 0.0–0.5)
Eosinophils Relative: 1 %
Immature Granulocytes: 0 %
Lymphocytes Relative: 28 %
Lymphs Abs: 1.7 10*3/uL (ref 0.7–4.0)
Monocytes Absolute: 0.5 10*3/uL (ref 0.1–1.0)
Monocytes Relative: 8 %
Neutro Abs: 3.7 10*3/uL (ref 1.7–7.7)
Neutrophils Relative %: 62 %

## 2020-10-10 LAB — CBC
HCT: 42.1 % (ref 36.0–46.0)
Hemoglobin: 13.4 g/dL (ref 12.0–15.0)
MCH: 31.5 pg (ref 26.0–34.0)
MCHC: 31.8 g/dL (ref 30.0–36.0)
MCV: 99.1 fL (ref 80.0–100.0)
Platelets: 273 10*3/uL (ref 150–400)
RBC: 4.25 MIL/uL (ref 3.87–5.11)
RDW: 16.6 % — ABNORMAL HIGH (ref 11.5–15.5)
WBC: 6 10*3/uL (ref 4.0–10.5)
nRBC: 0 % (ref 0.0–0.2)

## 2020-10-10 LAB — COMPREHENSIVE METABOLIC PANEL
ALT: 17 U/L (ref 0–44)
AST: 26 U/L (ref 15–41)
Albumin: 4.1 g/dL (ref 3.5–5.0)
Alkaline Phosphatase: 111 U/L (ref 38–126)
Anion gap: 14 (ref 5–15)
BUN: 18 mg/dL (ref 8–23)
CO2: 16 mmol/L — ABNORMAL LOW (ref 22–32)
Calcium: 9.6 mg/dL (ref 8.9–10.3)
Chloride: 106 mmol/L (ref 98–111)
Creatinine, Ser: 0.89 mg/dL (ref 0.44–1.00)
GFR, Estimated: >60 mL/min (ref >60)
Glucose, Bld: 110 mg/dL — ABNORMAL HIGH (ref 70–99)
Potassium: 4.5 mmol/L (ref 3.5–5.1)
Sodium: 136 mmol/L (ref 135–145)
Total Bilirubin: 0.9 mg/dL (ref 0.3–1.2)
Total Protein: 7.2 g/dL (ref 6.5–8.1)

## 2020-10-10 LAB — RAPID URINE DRUG SCREEN, HOSP PERFORMED
Amphetamines: NOT DETECTED
Barbiturates: NOT DETECTED
Benzodiazepines: POSITIVE — AB
Cocaine: NOT DETECTED
Opiates: NOT DETECTED
Tetrahydrocannabinol: NOT DETECTED

## 2020-10-10 LAB — URINALYSIS, ROUTINE W REFLEX MICROSCOPIC
Bilirubin Urine: NEGATIVE
Glucose, UA: NEGATIVE mg/dL
Hgb urine dipstick: NEGATIVE
Ketones, ur: 80 mg/dL — AB
Nitrite: NEGATIVE
Protein, ur: 30 mg/dL — AB
Specific Gravity, Urine: 1.018 (ref 1.005–1.030)
WBC, UA: >50 WBC/hpf — ABNORMAL HIGH (ref 0–5)
pH: 6 (ref 5.0–8.0)

## 2020-10-10 LAB — ETHANOL: Alcohol, Ethyl (B): <10 mg/dL (ref <10)

## 2020-10-10 LAB — PROTIME-INR
INR: 1.1 (ref 0.8–1.2)
Prothrombin Time: 13.3 seconds (ref 11.4–15.2)

## 2020-10-10 LAB — AMMONIA: Ammonia: 23 umol/L (ref 9–35)

## 2020-10-10 LAB — LIPASE, BLOOD: Lipase: 22 U/L (ref 11–51)

## 2020-10-10 LAB — RESP PANEL BY RT-PCR (FLU A&B, COVID) ARPGX2
Influenza A by PCR: NEGATIVE
Influenza B by PCR: NEGATIVE
SARS Coronavirus 2 by RT PCR: NEGATIVE

## 2020-10-10 LAB — APTT: aPTT: 28 seconds (ref 24–36)

## 2020-10-10 LAB — HIV ANTIBODY (ROUTINE TESTING W REFLEX): HIV Screen 4th Generation wRfx: NONREACTIVE

## 2020-10-10 MED ORDER — POLYETHYLENE GLYCOL 3350 17 G PO PACK: 17.0000 g | PACK | Freq: Every day | ORAL | Status: DC | PRN

## 2020-10-10 MED ORDER — ATORVASTATIN CALCIUM 40 MG PO TABS
40.0000 mg | ORAL_TABLET | Freq: Every day | ORAL | Status: DC
Start: 2020-10-10 — End: 2020-10-12
  Administered 2020-10-10 – 2020-10-11 (×2): 40 mg via ORAL
  Filled 2020-10-10 (×2): qty 1

## 2020-10-10 MED ORDER — SODIUM CHLORIDE 0.9 % IV SOLN: 250.0000 mL | INTRAVENOUS | Status: DC | PRN

## 2020-10-10 MED ORDER — SODIUM CHLORIDE 0.9 % IV SOLN
1.0000 g | INTRAVENOUS | Status: DC
  Administered 2020-10-10 – 2020-10-12 (×3): 1 g via INTRAVENOUS
  Filled 2020-10-10 (×3): qty 10

## 2020-10-10 MED ORDER — HALOPERIDOL LACTATE 5 MG/ML IJ SOLN
INTRAMUSCULAR | Status: AC
  Administered 2020-10-10: 16:00:00 2 mg via INTRAMUSCULAR
  Filled 2020-10-10: qty 1

## 2020-10-10 MED ORDER — HEPARIN SODIUM (PORCINE) 5000 UNIT/ML IJ SOLN
5000.0000 [IU] | Freq: Three times a day (TID) | INTRAMUSCULAR | Status: DC
  Administered 2020-10-10 – 2020-10-12 (×6): 5000 [IU] via SUBCUTANEOUS
  Filled 2020-10-10 (×6): qty 1

## 2020-10-10 MED ORDER — BUSPIRONE HCL 5 MG PO TABS
15.0000 mg | ORAL_TABLET | Freq: Two times a day (BID) | ORAL | Status: DC
  Administered 2020-10-10 – 2020-10-12 (×4): 15 mg via ORAL
  Filled 2020-10-10 (×4): qty 3

## 2020-10-10 MED ORDER — SODIUM CHLORIDE 0.9% FLUSH
3.0000 mL | Freq: Two times a day (BID) | INTRAVENOUS | Status: DC
  Administered 2020-10-12: 09:00:00 3 mL via INTRAVENOUS

## 2020-10-10 MED ORDER — ACETAMINOPHEN 325 MG PO TABS
650.0000 mg | ORAL_TABLET | Freq: Four times a day (QID) | ORAL | Status: DC | PRN
  Administered 2020-10-11: 650 mg via ORAL
  Filled 2020-10-10: qty 2

## 2020-10-10 MED ORDER — FOLIC ACID 1 MG PO TABS
1.0000 mg | ORAL_TABLET | Freq: Every day | ORAL | Status: DC
  Administered 2020-10-10 – 2020-10-12 (×3): 1 mg via ORAL
  Filled 2020-10-10 (×3): qty 1

## 2020-10-10 MED ORDER — ADULT MULTIVITAMIN W/MINERALS CH
1.0000 | ORAL_TABLET | Freq: Every day | ORAL | Status: DC
  Administered 2020-10-10 – 2020-10-12 (×3): 1 via ORAL
  Filled 2020-10-10 (×3): qty 1

## 2020-10-10 MED ORDER — ONDANSETRON HCL 4 MG/2ML IJ SOLN: 4.0000 mg | Freq: Four times a day (QID) | INTRAMUSCULAR | Status: DC | PRN

## 2020-10-10 MED ORDER — BISACODYL 10 MG RE SUPP: 10.0000 mg | Freq: Every day | RECTAL | Status: DC | PRN

## 2020-10-10 MED ORDER — CALCIUM CARBONATE-VITAMIN D 500-200 MG-UNIT PO TABS
1.0000 | ORAL_TABLET | Freq: Two times a day (BID) | ORAL | Status: DC
  Administered 2020-10-11 – 2020-10-12 (×3): 1 via ORAL
  Filled 2020-10-10 (×4): qty 1

## 2020-10-10 MED ORDER — THIAMINE HCL 100 MG PO TABS
100.0000 mg | ORAL_TABLET | Freq: Every day | ORAL | Status: DC
  Administered 2020-10-11 – 2020-10-12 (×2): 100 mg via ORAL
  Filled 2020-10-10 (×2): qty 1

## 2020-10-10 MED ORDER — QUETIAPINE FUMARATE 100 MG PO TABS
100.0000 mg | ORAL_TABLET | Freq: Every day | ORAL | Status: DC
  Administered 2020-10-10 – 2020-10-12 (×3): 100 mg via ORAL
  Filled 2020-10-10 (×3): qty 1

## 2020-10-10 MED ORDER — ASPIRIN 300 MG RE SUPP
300.0000 mg | Freq: Every day | RECTAL | Status: DC
  Administered 2020-10-11: 11:00:00 300 mg via RECTAL
  Filled 2020-10-10 (×2): qty 1

## 2020-10-10 MED ORDER — LORAZEPAM 2 MG/ML IJ SOLN: 1.0000 mg | Freq: Four times a day (QID) | INTRAMUSCULAR | Status: DC | PRN

## 2020-10-10 MED ORDER — PANTOPRAZOLE SODIUM 40 MG PO TBEC
40.0000 mg | DELAYED_RELEASE_TABLET | Freq: Two times a day (BID) | ORAL | Status: DC
  Administered 2020-10-10 – 2020-10-12 (×4): 40 mg via ORAL
  Filled 2020-10-10 (×4): qty 1

## 2020-10-10 MED ORDER — ONDANSETRON HCL 4 MG PO TABS: 4.0000 mg | ORAL_TABLET | Freq: Four times a day (QID) | ORAL | Status: DC | PRN

## 2020-10-10 MED ORDER — LORAZEPAM 1 MG PO TABS: 1.0000 mg | ORAL_TABLET | ORAL | Status: DC | PRN

## 2020-10-10 MED ORDER — SODIUM CHLORIDE 0.9% FLUSH: 3.0000 mL | INTRAVENOUS | Status: DC | PRN

## 2020-10-10 MED ORDER — HALOPERIDOL LACTATE 5 MG/ML IJ SOLN
5.0000 mg | Freq: Once | INTRAMUSCULAR | Status: AC
  Administered 2020-10-10: 20:00:00 5 mg via INTRAMUSCULAR
  Filled 2020-10-10: qty 1

## 2020-10-10 MED ORDER — LORAZEPAM 2 MG/ML IJ SOLN
1.0000 mg | Freq: Once | INTRAMUSCULAR | Status: AC
  Administered 2020-10-10: 16:00:00 1 mg via INTRAVENOUS
  Filled 2020-10-10: qty 1

## 2020-10-10 MED ORDER — LORAZEPAM 2 MG/ML IJ SOLN
1.0000 mg | Freq: Once | INTRAMUSCULAR | Status: AC
  Administered 2020-10-10: 12:00:00 1 mg via INTRAVENOUS
  Filled 2020-10-10: qty 1

## 2020-10-10 MED ORDER — ACETAMINOPHEN 650 MG RE SUPP: 650.0000 mg | Freq: Four times a day (QID) | RECTAL | Status: DC | PRN

## 2020-10-10 MED ORDER — HALOPERIDOL LACTATE 5 MG/ML IJ SOLN: 2.0000 mg | Freq: Four times a day (QID) | INTRAMUSCULAR | Status: DC | PRN

## 2020-10-10 MED ORDER — DULOXETINE HCL 60 MG PO CPEP
60.0000 mg | ORAL_CAPSULE | Freq: Every morning | ORAL | Status: DC
  Administered 2020-10-11 – 2020-10-12 (×2): 60 mg via ORAL
  Filled 2020-10-10 (×2): qty 1

## 2020-10-10 MED ORDER — TRAZODONE HCL 50 MG PO TABS
150.0000 mg | ORAL_TABLET | Freq: Once | ORAL | Status: AC
  Administered 2020-10-10: 21:00:00 150 mg via ORAL
  Filled 2020-10-10: qty 3

## 2020-10-10 MED ORDER — LORAZEPAM 2 MG/ML IJ SOLN: 1.0000 mg | INTRAMUSCULAR | Status: DC | PRN

## 2020-10-10 MED ORDER — SODIUM CHLORIDE 0.9 % IV SOLN: INTRAVENOUS | Status: DC

## 2020-10-10 MED ORDER — SODIUM CHLORIDE 0.9% FLUSH
3.0000 mL | Freq: Two times a day (BID) | INTRAVENOUS | Status: DC
  Administered 2020-10-11 – 2020-10-12 (×2): 3 mL via INTRAVENOUS

## 2020-10-10 MED ORDER — THIAMINE HCL 100 MG/ML IJ SOLN
100.0000 mg | Freq: Every day | INTRAMUSCULAR | Status: DC
  Administered 2020-10-10: 21:00:00 100 mg via INTRAVENOUS
  Filled 2020-10-10: qty 2

## 2020-10-10 MED ORDER — TOPIRAMATE 25 MG PO TABS
50.0000 mg | ORAL_TABLET | Freq: Two times a day (BID) | ORAL | Status: DC
  Administered 2020-10-10 – 2020-10-12 (×4): 50 mg via ORAL
  Filled 2020-10-10 (×4): qty 2

## 2020-10-10 MED ORDER — LAMOTRIGINE 100 MG PO TABS
50.0000 mg | ORAL_TABLET | Freq: Two times a day (BID) | ORAL | Status: DC
  Administered 2020-10-10 – 2020-10-12 (×4): 50 mg via ORAL
  Filled 2020-10-10: qty 2
  Filled 2020-10-10 (×3): qty 1

## 2020-10-10 NOTE — ED Provider Notes (Addendum)
[facility] EMERGENCY DEPARTMENT Provider Note   CSN: [id number] Arrival date & time: 10/10/20  1021     History Chief Complaint  Patient presents with  . Dysphagia    [name] is a 69 y.o. female.  Patient arrived by EMS for concerns for stroke.  There apparently was some speech problems but upon arrival patient seemed to be speaking properly in short sentences.  There definitely seem to be some memory problem.  Patient has past history of alcohol abuse.  Other family members started stated that symptoms started about 3 days ago.  But EMS was related to the patient and felt that things started around 7 PM last night.  Patient without evidence of any extremity weakness.  So code stroke was not formally initiated but stroke order set was initiated.        Past Medical History:  Diagnosis Date  . Alcohol abuse   . Anxiety   . Arthritis   . Chronic back pain    radiating to bilat legs/weakness  . Chronic knee pain   . Compression fracture of T11 vertebra ([place])   . Edema   . Gout   . Hemiplegia and hemiparesis following cerebral infarction affecting right dominant side ([place])   . Hemorrhagic stroke ([place])   . History of dental problems   . Hypertension   . Insomnia, unspecified   . Low back pain   . Major depressive disorder, single episode, moderate ([place])   . PAF (paroxysmal atrial fibrillation) ([place])   . Pain in joint involving shoulder region   . Panic disorder without agoraphobia   . Pelvic pain   . Seizures ([place])   . Spinal stenosis, lumbar   . Stroke [facility])    old left posterior parietal/temporal/occipital lobe infarct; TIA 5 - 2018 with aphasia, given tPA  . Subarachnoid hemorrhage ([place])   . Unspecified sequelae of unspecified cerebrovascular disease     Patient Active Problem List   Diagnosis Date Noted  . TIA (transient ischemic attack) 10/10/2020  . Major depressive disorder, recurrent episode, moderate ([place]) 01/03/2020  . Unsteadiness on feet  11/21/2019  . Generalized anxiety disorder   . Edema, unspecified   . Gout   . Low back pain   . Essential (primary) hypertension   . 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 ([place])   . Insomnia, unspecified   . Panic disorder without agoraphobia   . Personal history of transient ischemic attack (TIA), and cerebral infarction without residual deficits   . Acute lower UTI   . Weakness   . Paroxysmal atrial fibrillation ([place]) 03/05/2017  . Stroke aborted by administration of thrombolytic agent ([place]) 03/05/2017  . Former smoker 03/03/2017  . Gastric bypass status for obesity 03/03/2017  . Seizure disorder as sequela of cerebrovascular accident ([place]) 03/03/2017  . Chronic bilateral thoracic back pain 06/16/2016  . Chronic diarrhea 06/16/2016  . Impaired mobility and ADLs 06/05/2016  . Neurocognitive deficits 06/05/2016  . History of stroke 06/05/2016  . Pelvic pain in female 11/19/2015  . Arthritis of knee 06/24/2013  . HYPERKALEMIA 07/07/2009  . DIASTOLIC HEART FAILURE, CHRONIC 07/07/2009  . DYSPNEA 07/07/2009    Past Surgical History:  Procedure Laterality Date  . BARIATRIC SURGERY    . BIOPSY N/A 12/02/2015   Procedure: BIOPSY;  Surgeon: Rogene Houston, MD;  Location: [facility] ENDO SUITE;  Service: Endoscopy;  Laterality: N/A;  Random colon biopsies  . CHOLECYSTECTOMY    .  COLONOSCOPY    . COLONOSCOPY N/A 12/02/2015   Procedure: COLONOSCOPY;  Surgeon: Rogene Houston, MD;  Location: [facility] ENDO SUITE;  Service: Endoscopy;  Laterality: N/A;  110  . EYE SURGERY     as a child  . GASTRIC BYPASS     in 1985 for obesity  . POLYPECTOMY N/A 12/02/2015   Procedure: POLYPECTOMY;  Surgeon: Rogene Houston, MD;  Location: [facility] ENDO SUITE;  Service: Endoscopy;  Laterality: N/A;  Splenic flexure polyp  . right foot surgery       OB History   No obstetric history on file.     Family History  Problem Relation Age of  Onset  . Healthy Mother   . Hodgkin's lymphoma Father   . Arthritis Sister     Social History   Tobacco Use  . Smoking status: Former Smoker    Types: Cigarettes  . Smokeless tobacco: Never Used  . Tobacco comment: Patient has quit but not sure when   Vaping Use  . Vaping Use: Never used  Substance Use Topics  . Alcohol use: No    Alcohol/week: 0.0 standard drinks  . Drug use: No    Home Medications Prior to Admission medications   Medication Sig Start Date End Date Taking? Authorizing Provider  traZODone (DESYREL) 50 MG tablet Take by mouth. 06/17/20  Yes [provider]  allopurinol (ZYLOPRIM) 300 MG tablet Take 300 mg by mouth daily. 08/29/18   [provider]  atorvastatin (LIPITOR) 40 MG tablet Take 40 mg by mouth at bedtime. 08/29/18   [provider]  busPIRone (BUSPAR) 15 MG tablet Take 1 tablet (15 mg total) by mouth 2 (two) times daily. Need appointment for further refills. 04/15/20   Loman Brooklyn, FNP  calcium-vitamin D (OSCAL WITH D) 500-200 MG-UNIT tablet Take 1 tablet by mouth 2 (two) times daily.    [provider]  Cholecalciferol (VITAMIN D3) 25 MCG (1000 UT) CAPS Take by mouth.    [provider]  DULoxetine (CYMBALTA) 60 MG capsule Take 1 capsule (60 mg total) by mouth every morning. 01/22/20   Loman Brooklyn, FNP  fluconazole (DIFLUCAN) 150 MG tablet Take 150 mg by mouth every other day. 04/23/20   [provider]  furosemide (LASIX) 40 MG tablet Take 1 tablet (40 mg total) by mouth daily. (Needs to be seen before next refill) 10/04/20   Dettinger, Fransisca Kaufmann, MD  lamoTRIgine (LAMICTAL) 25 MG tablet Take 50 mg by mouth 2 (two) times daily. 08/29/18   [provider]  meloxicam (MOBIC) 7.5 MG tablet Take 1 tablet (7.5 mg total) by mouth 2 (two) times daily. 01/22/20   Loman Brooklyn, FNP  OVER THE COUNTER MEDICATION Duffy Bruce- vite tablet    [provider]  pantoprazole (PROTONIX) 40 MG tablet Take  1 tablet (40 mg total) by mouth 2 (two) times daily. (Needs to be seen before next refill) 10/04/20   Dettinger, Fransisca Kaufmann, MD  potassium chloride SA (KLOR-CON) 20 MEQ tablet Take 1 tablet (20 mEq total) by mouth daily. 01/22/20   Loman Brooklyn, FNP  QUEtiapine (SEROQUEL) 100 MG tablet Take 1 tablet (100 mg total) by mouth daily. 01/22/20   Loman Brooklyn, FNP  saccharomyces boulardii (FLORASTOR) 250 MG capsule Take 1 capsule (250 mg total) by mouth 2 (two) times daily. 01/02/20   Loman Brooklyn, FNP  topiramate (TOPAMAX) 100 MG tablet Take 100 mg by mouth 2 (two) times daily. 09/20/20  [provider]  topiramate (TOPAMAX) 25 MG tablet Take 25 mg by mouth 2 (two) times daily. 07/16/20   [provider]  topiramate (TOPAMAX) 50 MG tablet Take 50 mg by mouth 2 (two) times daily. 05/25/20   [provider]    Allergies    Patient has no known allergies.  Review of Systems   Review of Systems  Constitutional: Negative for chills and fever.  HENT: Negative for rhinorrhea and sore throat.   Eyes: Negative for visual disturbance.  Respiratory: Negative for cough and shortness of breath.   Cardiovascular: Negative for chest pain and leg swelling.  Gastrointestinal: Negative for abdominal pain, diarrhea, nausea and vomiting.  Genitourinary: Negative for dysuria.  Musculoskeletal: Negative for back pain and neck pain.  Skin: Negative for rash.  Neurological: Positive for speech difficulty. Negative for dizziness, light-headedness and headaches.  Hematological: Does not bruise/bleed easily.  Psychiatric/Behavioral: Positive for confusion.    Physical Exam Updated Vital Signs BP 135/83   Pulse 80   Temp 97.7 F (36.5 C) (Oral)   Resp (!) 27   Ht 1.727 m (5\' 8" )   Wt 99.8 kg   SpO2 100%   BMI 33.45 kg/m   Physical Exam Vitals and nursing note reviewed.  Constitutional:      General: She is not in acute distress.    Appearance: Normal appearance. She is  well-developed and well-nourished.  HENT:     Head: Normocephalic and atraumatic.     Mouth/Throat:     Mouth: Mucous membranes are dry.  Eyes:     Extraocular Movements: Extraocular movements intact.     Conjunctiva/sclera: Conjunctivae normal.     Pupils: Pupils are equal, round, and reactive to light.  Cardiovascular:     Rate and Rhythm: Normal rate and regular rhythm.     Heart sounds: No murmur heard.   Pulmonary:     Effort: Pulmonary effort is normal. No respiratory distress.     Breath sounds: Normal breath sounds.  Abdominal:     Palpations: Abdomen is soft.     Tenderness: There is no abdominal tenderness.  Musculoskeletal:        General: No edema. Normal range of motion.     Cervical back: Normal range of motion and neck supple.  Skin:    General: Skin is warm and dry.  Neurological:     Mental Status: She is alert.     Comments: Patient awake and talk speech appears normal.  But there seems to be some memory problems.  Or some degree of confusion.  No weakness.  Moving all 4 extremities.  Facial muscles and cranial nerves appear to be intact.  Psychiatric:        Mood and Affect: Mood and affect normal.     ED Results / Procedures / Treatments   Labs (all labs ordered are listed, but only abnormal results are displayed) Labs Reviewed  CBC - Abnormal; Notable for the following components:      Result Value   RDW 16.6 (*)    All other components within normal limits  COMPREHENSIVE METABOLIC PANEL - Abnormal; Notable for the following components:   CO2 16 (*)    Glucose, Bld 110 (*)    All other components within normal limits  RAPID URINE DRUG SCREEN, HOSP PERFORMED - Abnormal; Notable for the following components:   Benzodiazepines POSITIVE (*)    All other components within normal limits  URINALYSIS, ROUTINE W REFLEX MICROSCOPIC - Abnormal;  Notable for the following components:   Color, Urine AMBER (*)    APPearance HAZY (*)    Ketones, ur 80 (*)     Protein, ur 30 (*)    Leukocytes,Ua LARGE (*)    WBC, UA >50 (*)    Bacteria, UA MANY (*)    All other components within normal limits  URINE CULTURE  ETHANOL  PROTIME-INR  APTT  DIFFERENTIAL  AMMONIA  LIPASE, BLOOD    EKG None  Radiology DG Chest 1 View  Result Date: 10/10/2020 CLINICAL DATA:  Slow speech, possible stroke, history of stroke EXAM: CHEST  1 VIEW COMPARISON:  Portable exam 1029 hours compared 06/06/2013 FINDINGS: Normal heart size, mediastinal contours, and pulmonary vascularity. Lungs clear. No acute infiltrate, pleural effusion, or pneumothorax. Bones demineralized. Chronic RIGHT rotator cuff tear. IMPRESSION: No acute abnormalities. Electronically Signed   By: Lavonia Dana M.D.   On: 10/10/2020 11:02   CT HEAD WO CONTRAST  Result Date: 10/10/2020 CLINICAL DATA:  Mental status changes squamous slowed speech, history of hemorrhagic stroke EXAM: CT HEAD WITHOUT CONTRAST TECHNIQUE: Contiguous axial images were obtained from the base of the skull through the vertex without intravenous contrast. COMPARISON:  06/26/2020 FINDINGS: Brain: Chronic left occipital infarct and encephalomalacia. Patchy hypoattenuation in deep and periventricular white matter bilaterally as before. No evidence of acute infarct, acute hemorrhage, midline shift, mass, or mass effect. There is mild ex vacuo dilatation of the occipital horn left lateral ventricle as before. Mild diffuse parenchymal atrophy. Vascular: No hyperdense vessel or unexpected calcification. Skull: Normal. Negative for fracture or focal lesion. Sinuses/Orbits: No acute finding. Other: None IMPRESSION: 1. No acute intracranial process. 2. Chronic left occipital infarct and encephalomalacia. 3. Atrophy and nonspecific white matter changes. Electronically Signed   By: Lucrezia Europe M.D.   On: 10/10/2020 11:27    Procedures Procedures (including critical care time)  CRITICAL CARE Performed by: Fredia Sorrow Total critical care  time: 35 minutes Critical care time was exclusive of separately billable procedures and treating other patients. Critical care was necessary to treat or prevent imminent or life-threatening deterioration. Critical care was time spent personally by me on the following activities: development of treatment plan with patient and/or surrogate as well as nursing, discussions with consultants, evaluation of patient's response to treatment, examination of patient, obtaining history from patient or surrogate, ordering and performing treatments and interventions, ordering and review of laboratory studies, ordering and review of radiographic studies, pulse oximetry and re-evaluation of patient's condition.   Medications Ordered in ED Medications  0.9 %  sodium chloride infusion ( Intravenous New Bag/Given 10/10/20 1204)  cefTRIAXone (ROCEPHIN) 1 g in sodium chloride 0.9 % 100 mL IVPB (has no administration in time range)  LORazepam (ATIVAN) injection 1 mg (1 mg Intravenous Given 10/10/20 1154)    ED Course  I have reviewed the triage vital signs and the nursing notes.  Pertinent labs & imaging results that were available during my care of the patient were reviewed by me and considered in my medical decision making (see chart for details).    MDM Rules/Calculators/A&P                          Patient with potential stroke.  But seem to have more of altered mental status and some degree of confusion.  There was some history of difficulty speaking.  No motor weakness here.  Stroke order set initiated.  But full code stroke was not  initiated.  Patient's head CT without acute findings.  Patient's work-up urine drug screen negative.  And urinalysis which was pending is now back.  Seems to be consistent with urinary tract infection.  Patient will need antibiotics for that.  Already consulted the hospitalist.  Patient may require MRI in the morning if there is any concerns about a CVA.  No leukocytosis.  Lecture  lites without significant abnormalities other than CO2 was 16. Liver function tests were normal. Chest x-ray without acute findings.  Ammonia level was normal.     Final Clinical Impression(s) / ED Diagnoses Final diagnoses:  Altered mental status, unspecified altered mental status type  Acute cystitis without hematuria    Rx / DC Orders ED Discharge Orders    None       Fredia Sorrow, MD 10/10/20 1508    Fredia Sorrow, MD 10/10/20 [phone]

## 2020-10-10 NOTE — ED Notes (Signed)
Called floor to give report and spoke with [facility] who requests this nurse to return call in 10 minutes as she is currently with a patient.

## 2020-10-10 NOTE — H&P (Signed)
Patient Demographics:    [name], is a 69 y.o. 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 (HCC)   Acute lower UTI   DIASTOLIC HEART FAILURE, CHRONIC   Essential (primary) hypertension   Personal history of transient ischemic attack (TIA), and cerebral infarction without residual deficits   Paroxysmal atrial fibrillation (HCC)   Seizure disorder as sequela of cerebrovascular accident (HCC)   Major depressive disorder, recurrent episode, moderate (HCC)  Brief Summary:- 69 year old female who is a reformed smoker with past medical history relevant for prior stroke with encephalomalacia and brain atrophy, dementia,, diastolic dysfunction CHF, HTN, paroxysmal atrial fibrillation, history of seizures as well as major depressive disorder and history of EtOH abuse presents to the ED with altered mentation/confusion--- when patient arrived by EMS there was concern for possible stroke however patient was found to have normal speech, and she was moving all extremities appropriately patient was just globally confused, disoriented, agitated and restless being admitted for acute metabolic encephalopathy with concerns for UTI and possible stroke    A/p 1)Acute metabolic encephalopathy--- suspect most likely related to UTI may need to rule out acute stroke -CT head without acute findings, MRI brain and carotid artery Dopplers pending -Serum ammonia is not elevated -UDS positive for benzo by patient received lorazepam in the  ED  2)Presumed UTI--- probably contributing to #1 above, IV Rocephin as ordered pending culture results  3)Social/Ethics--- -I called and discussed case with patient's daughter [name] at [phone] -Attempted to reach patient's husband at [phone]--- unable to leave message  4)History of seizure disorder/history of prior EtOH abuse--- lorazepam as ordered -Multivitamin as ordered -Continue Lamictal  5)Depressive disorder and dementia----CT head with atrophy -Continue trazodone 150 mg nightly, Seroquel 100 mg daily, -Cymbalta and BuSpar as ordered  6) history of prior CVA--- aspirin and Lipitor as ordered, stroke work-up as above #1   Disposition/Need for in-Hospital Stay- patient unable to be discharged at this time due to --- acute metabolic encephalopathy and agitation requiring further neuro work-up and IV antibiotics pending urine culture results*  Dispo: The patient is from: Home              Anticipated d/c is to: Home              Anticipated d/c date is: 1 day              Patient currently is not medically stable to d/c. Barriers: Not Clinically Stable-    With History of - Reviewed by me  Past Medical History:  Diagnosis Date  . Alcohol abuse   . Anxiety   . Arthritis   .  Chronic back pain    radiating to bilat legs/weakness  . Chronic knee pain   . Compression fracture of T11 vertebra (HCC)   . Edema   . Gout   . Hemiplegia and hemiparesis following cerebral infarction affecting right dominant side (HCC)   . Hemorrhagic stroke (HCC)   . History of dental problems   . Hypertension   . Insomnia, unspecified   . Low back pain   . Major depressive disorder, single episode, moderate (HCC)   . PAF (paroxysmal atrial fibrillation) (HCC)   . Pain in joint involving shoulder region   . Panic disorder without agoraphobia   . Pelvic pain   . Seizures (HCC)   . Spinal stenosis, lumbar   . Stroke [facility])    old left posterior parietal/temporal/occipital  lobe infarct; TIA 5 - 2018 with aphasia, given tPA  . Subarachnoid hemorrhage (HCC)   . Unspecified sequelae of unspecified cerebrovascular disease       Past Surgical History:  Procedure Laterality Date  . BARIATRIC SURGERY    . BIOPSY N/A 12/02/2015   Procedure: BIOPSY;  Surgeon: Malissa Hippo, MD;  Location: [facility];  Service: Endoscopy;  Laterality: N/A;  Random colon biopsies  . CHOLECYSTECTOMY    . COLONOSCOPY    . COLONOSCOPY N/A 12/02/2015   Procedure: COLONOSCOPY;  Surgeon: Malissa Hippo, MD;  Location: [facility];  Service: Endoscopy;  Laterality: N/A;  110  . EYE SURGERY     as a child  . GASTRIC BYPASS     in 1985 for obesity  . POLYPECTOMY N/A 12/02/2015   Procedure: POLYPECTOMY;  Surgeon: Malissa Hippo, MD;  Location: [facility];  Service: Endoscopy;  Laterality: N/A;  Splenic flexure polyp  . right foot surgery        Chief Complaint  Patient presents with  . Dysphagia      HPI:    [name]  is a 69 y.o. female who is a reformed smoker with past medical history relevant for prior stroke with encephalomalacia and brain atrophy, dementia,, diastolic dysfunction CHF, HTN, paroxysmal atrial fibrillation, history of seizures as well as major depressive disorder and history of EtOH abuse presents to the ED with altered mentation/confusion--- when patient arrived by EMS there was concern for possible stroke however patient was found to have normal speech, and she was moving all extremities appropriately patient was just globally confused, disoriented, agitated and restless -EDP gave lorazepam for sedation -UA suspicious for UTI -UDS with benzos -Serum ammonia is only 23, BAL < 10 -CMP is mostly unremarkable, CBC WNL, INR is 1.1 -Chest x-ray without acute cardiopulmonary findings -CT head without acute intracranial process however patient does have chronic left occipital infarct and encephalomalacia with atrophy and nonspecific white matter changes  -I  called and discussed case with patient's daughter [name] at [phone] - Attempted to reach patient's husband at [phone]--- unable to leave message     Review of systems:    In addition to the HPI above,   A full Review of  Systems was done, all other systems reviewed are negative except as noted above in HPI , .    Social History:  Reviewed by me    Social History   Tobacco Use  . Smoking status: Former Smoker    Types: Cigarettes  . Smokeless tobacco: Never Used  . Tobacco comment: Patient has quit but not sure when   Substance Use Topics  . Alcohol use:  No    Alcohol/week: 0.0 standard drinks     Family History :  Reviewed by me    Family History  Problem Relation Age of Onset  . Healthy Mother   . Hodgkin's lymphoma Father   . Arthritis Sister      Home Medications:   Prior to Admission medications   Medication Sig Start Date End Date Taking? Authorizing Provider  traZODone (DESYREL) 50 MG tablet Take by mouth. 06/17/20  Yes [provider]  allopurinol (ZYLOPRIM) 300 MG tablet Take 300 mg by mouth daily. 08/29/18   [provider]  atorvastatin (LIPITOR) 40 MG tablet Take 40 mg by mouth at bedtime. 08/29/18   [provider]  busPIRone (BUSPAR) 15 MG tablet Take 1 tablet (15 mg total) by mouth 2 (two) times daily. Need appointment for further refills. 04/15/20   Gwenlyn Fudge, FNP  calcium-vitamin D (OSCAL WITH D) 500-200 MG-UNIT tablet Take 1 tablet by mouth 2 (two) times daily.    [provider]  Cholecalciferol (VITAMIN D3) 25 MCG (1000 UT) CAPS Take by mouth.    [provider]  DULoxetine (CYMBALTA) 60 MG capsule Take 1 capsule (60 mg total) by mouth every morning. 01/22/20   Gwenlyn Fudge, FNP  fluconazole (DIFLUCAN) 150 MG tablet Take 150 mg by mouth every other day. 04/23/20   [provider]  furosemide (LASIX) 40 MG tablet Take 1 tablet (40 mg total) by mouth daily. (Needs to be seen  before next refill) 10/04/20   Dettinger, Elige Radon, MD  lamoTRIgine (LAMICTAL) 25 MG tablet Take 50 mg by mouth 2 (two) times daily. 08/29/18   [provider]  meloxicam (MOBIC) 7.5 MG tablet Take 1 tablet (7.5 mg total) by mouth 2 (two) times daily. 01/22/20   Gwenlyn Fudge, FNP  OVER THE COUNTER MEDICATION Antony Madura- vite tablet    [provider]  pantoprazole (PROTONIX) 40 MG tablet Take 1 tablet (40 mg total) by mouth 2 (two) times daily. (Needs to be seen before next refill) 10/04/20   Dettinger, Elige Radon, MD  potassium chloride SA (KLOR-CON) 20 MEQ tablet Take 1 tablet (20 mEq total) by mouth daily. 01/22/20   Gwenlyn Fudge, FNP  QUEtiapine (SEROQUEL) 100 MG tablet Take 1 tablet (100 mg total) by mouth daily. 01/22/20   Gwenlyn Fudge, FNP  saccharomyces boulardii (FLORASTOR) 250 MG capsule Take 1 capsule (250 mg total) by mouth 2 (two) times daily. 01/02/20   Gwenlyn Fudge, FNP  topiramate (TOPAMAX) 100 MG tablet Take 100 mg by mouth 2 (two) times daily. 09/20/20   [provider]  topiramate (TOPAMAX) 25 MG tablet Take 25 mg by mouth 2 (two) times daily. 07/16/20   [provider]  topiramate (TOPAMAX) 50 MG tablet Take 50 mg by mouth 2 (two) times daily. 05/25/20   [provider]     Allergies:    No Known Allergies   Physical Exam:   Vitals  Blood pressure 135/83, pulse 80, temperature 97.7 F (36.5 C), temperature source Oral, resp. rate (!) 27, height 5\' 8"  (1.727 m), weight 99.8 kg, SpO2 100 %.  Physical Examination: General appearance -confused and agitated,  mental status -significant confusion, agitation and restlessness Eyes - sclera anicteric Neck - supple, no JVD elevation , Chest - clear  to auscultation bilaterally, symmetrical air movement,  Heart - S1 and S2 normal, regular  Abdomen - soft, nontender, nondistended, no masses or organomegaly Neurological -moving all extremities, but  globally very confused  extremities -  no pedal edema noted, intact peripheral pulses  Skin - warm, dry     Data Review:    CBC Recent Labs  Lab 10/10/20 1147  WBC 6.0  HGB 13.4  HCT 42.1  PLT 273  MCV 99.1  MCH 31.5  MCHC 31.8  RDW 16.6*  LYMPHSABS 1.7  MONOABS 0.5  EOSABS 0.1  BASOSABS 0.0    Chemistries  Recent Labs  Lab 10/10/20 1147  NA 136  K 4.5  CL 106  CO2 16*  GLUCOSE 110*  BUN 18  CREATININE 0.89  CALCIUM 9.6  AST 26  ALT 17  ALKPHOS 111  BILITOT 0.9   estimated creatinine clearance is 75.8 mL/min (by C-G formula based on SCr of 0.89 mg/dL). ------------------------------------------------------------------------------------------------------ Coagulation profile Recent Labs  Lab 10/10/20 1147  INR 1.1   Cardiac Enzymes No results for input(s): CKMB, TROPONINI, MYOGLOBIN in the last 168 hours.  Invalid input(s): CK ------------------------------------------------------------------------------------------------------  Urinalysis    Component Value Date/Time   COLORURINE AMBER (A) 10/10/2020 1411   APPEARANCEUR HAZY (A) 10/10/2020 1411   LABSPEC 1.018 10/10/2020 1411   PHURINE 6.0 10/10/2020 1411   GLUCOSEU NEGATIVE 10/10/2020 1411   HGBUR NEGATIVE 10/10/2020 1411   BILIRUBINUR NEGATIVE 10/10/2020 1411   KETONESUR 80 (A) 10/10/2020 1411   PROTEINUR 30 (A) 10/10/2020 1411   NITRITE NEGATIVE 10/10/2020 1411   LEUKOCYTESUR LARGE (A) 10/10/2020 1411     Imaging Results:    DG Chest 1 View  Result Date: 10/10/2020 CLINICAL DATA:  Slow speech, possible stroke, history of stroke EXAM: CHEST  1 VIEW COMPARISON:  Portable exam 1029 hours compared 06/06/2013 FINDINGS: Normal heart size, mediastinal contours, and pulmonary vascularity. Lungs clear. No acute infiltrate, pleural effusion, or pneumothorax. Bones demineralized. Chronic RIGHT rotator cuff tear. IMPRESSION: No acute abnormalities. Electronically Signed   By: Ulyses Southward M.D.   On: 10/10/2020 11:02   CT HEAD WO  CONTRAST  Result Date: 10/10/2020 CLINICAL DATA:  Mental status changes squamous slowed speech, history of hemorrhagic stroke EXAM: CT HEAD WITHOUT CONTRAST TECHNIQUE: Contiguous axial images were obtained from the base of the skull through the vertex without intravenous contrast. COMPARISON:  06/26/2020 FINDINGS: Brain: Chronic left occipital infarct and encephalomalacia. Patchy hypoattenuation in deep and periventricular white matter bilaterally as before. No evidence of acute infarct, acute hemorrhage, midline shift, mass, or mass effect. There is mild ex vacuo dilatation of the occipital horn left lateral ventricle as before. Mild diffuse parenchymal atrophy. Vascular: No hyperdense vessel or unexpected calcification. Skull: Normal. Negative for fracture or focal lesion. Sinuses/Orbits: No acute finding. Other: None IMPRESSION: 1. No acute intracranial process. 2. Chronic left occipital infarct and encephalomalacia. 3. Atrophy and nonspecific white matter changes. Electronically Signed   By: Corlis Leak M.D.   On: 10/10/2020 11:27    Radiological Exams on Admission: DG Chest 1 View  Result Date: 10/10/2020 CLINICAL DATA:  Slow speech, possible stroke, history of stroke EXAM: CHEST  1 VIEW COMPARISON:  Portable exam 1029 hours compared 06/06/2013 FINDINGS: Normal heart size, mediastinal contours, and pulmonary vascularity. Lungs clear. No acute infiltrate, pleural effusion, or pneumothorax. Bones demineralized. Chronic RIGHT rotator cuff tear. IMPRESSION: No acute abnormalities. Electronically Signed   By: Ulyses Southward M.D.   On: 10/10/2020 11:02   CT HEAD WO CONTRAST  Result Date: 10/10/2020 CLINICAL DATA:  Mental status changes squamous slowed speech, history of hemorrhagic stroke EXAM: CT HEAD WITHOUT CONTRAST TECHNIQUE: Contiguous axial images were  obtained from the base of the skull through the vertex without intravenous contrast. COMPARISON:  06/26/2020 FINDINGS: Brain: Chronic left occipital  infarct and encephalomalacia. Patchy hypoattenuation in deep and periventricular white matter bilaterally as before. No evidence of acute infarct, acute hemorrhage, midline shift, mass, or mass effect. There is mild ex vacuo dilatation of the occipital horn left lateral ventricle as before. Mild diffuse parenchymal atrophy. Vascular: No hyperdense vessel or unexpected calcification. Skull: Normal. Negative for fracture or focal lesion. Sinuses/Orbits: No acute finding. Other: None IMPRESSION: 1. No acute intracranial process. 2. Chronic left occipital infarct and encephalomalacia. 3. Atrophy and nonspecific white matter changes. Electronically Signed   By: Corlis Leak M.D.   On: 10/10/2020 11:27   DVT Prophylaxis -SCD/Heparin AM Labs Ordered, also please review Full Orders  Family Communication: Admission, patients condition and plan of care including tests being ordered have been discussed with the patient and daughter who indicate understanding and agree with the plan   Code Status - Full Code  Likely DC to home  Condition --  stable,  Shon Hale M.D on 10/10/2020 at 5:37 PM Go to [email or web address] -  for contact info  [facility] - Office  [phone]

## 2020-10-10 NOTE — ED Triage Notes (Addendum)
Pt brought in by Ems due possible stroke. Pt noted to be slower with speech last night 7pm. Pt has hx of ischemic and hemm stroke. Pt does not have drift in any extremities. Able to follow commands.  Able to tell me her name and that she was in the hospital. Per EMS pt reported that she was confused last night, trying to pack things up and throwing things off bed. Pt answer most questions  I dont know

## 2020-10-10 NOTE — ED Notes (Signed)
Pt trying to pull out IV. Mittens placed on patient. Two fingers are able to placed under writs of mittens.

## 2020-10-10 NOTE — ED Notes (Signed)
Pt transported to CT ?

## 2020-10-11 ENCOUNTER — Observation Stay (HOSPITAL_COMMUNITY): Payer: Medicare Other

## 2020-10-11 ENCOUNTER — Observation Stay (HOSPITAL_BASED_OUTPATIENT_CLINIC_OR_DEPARTMENT_OTHER): Payer: Medicare Other

## 2020-10-11 DIAGNOSIS — G9341 Metabolic encephalopathy: Secondary | ICD-10-CM | POA: Diagnosis not present

## 2020-10-11 DIAGNOSIS — I6389 Other cerebral infarction: Secondary | ICD-10-CM

## 2020-10-11 DIAGNOSIS — I6623 Occlusion and stenosis of bilateral posterior cerebral arteries: Secondary | ICD-10-CM | POA: Diagnosis not present

## 2020-10-11 DIAGNOSIS — R410 Disorientation, unspecified: Secondary | ICD-10-CM | POA: Diagnosis not present

## 2020-10-11 DIAGNOSIS — Z8673 Personal history of transient ischemic attack (TIA), and cerebral infarction without residual deficits: Secondary | ICD-10-CM | POA: Diagnosis not present

## 2020-10-11 DIAGNOSIS — R4781 Slurred speech: Secondary | ICD-10-CM | POA: Diagnosis not present

## 2020-10-11 DIAGNOSIS — I6523 Occlusion and stenosis of bilateral carotid arteries: Secondary | ICD-10-CM | POA: Diagnosis not present

## 2020-10-11 DIAGNOSIS — I6603 Occlusion and stenosis of bilateral middle cerebral arteries: Secondary | ICD-10-CM | POA: Diagnosis not present

## 2020-10-11 LAB — CBC
HCT: 37.7 % (ref 36.0–46.0)
Hemoglobin: 11.9 g/dL — ABNORMAL LOW (ref 12.0–15.0)
MCH: 32.1 pg (ref 26.0–34.0)
MCHC: 31.6 g/dL (ref 30.0–36.0)
MCV: 101.6 fL — ABNORMAL HIGH (ref 80.0–100.0)
Platelets: 208 10*3/uL (ref 150–400)
RBC: 3.71 MIL/uL — ABNORMAL LOW (ref 3.87–5.11)
RDW: 16.9 % — ABNORMAL HIGH (ref 11.5–15.5)
WBC: 4.9 10*3/uL (ref 4.0–10.5)
nRBC: 0 % (ref 0.0–0.2)

## 2020-10-11 LAB — BASIC METABOLIC PANEL
Anion gap: 11 (ref 5–15)
BUN: 17 mg/dL (ref 8–23)
CO2: 19 mmol/L — ABNORMAL LOW (ref 22–32)
Calcium: 9.1 mg/dL (ref 8.9–10.3)
Chloride: 109 mmol/L (ref 98–111)
Creatinine, Ser: 0.71 mg/dL (ref 0.44–1.00)
GFR, Estimated: >60 mL/min (ref >60)
Glucose, Bld: 85 mg/dL (ref 70–99)
Potassium: 3.3 mmol/L — ABNORMAL LOW (ref 3.5–5.1)
Sodium: 139 mmol/L (ref 135–145)

## 2020-10-11 LAB — ECHOCARDIOGRAM COMPLETE
Area-P 1/2: 2.21 cm2
Height: 68 in
S' Lateral: 1.47 cm
Weight: 3520 oz

## 2020-10-11 MED ORDER — POTASSIUM CHLORIDE CRYS ER 20 MEQ PO TBCR
40.0000 meq | EXTENDED_RELEASE_TABLET | ORAL | Status: AC
Start: 2020-10-11 — End: 2020-10-11
  Administered 2020-10-11 (×2): 40 meq via ORAL
  Filled 2020-10-11 (×2): qty 2

## 2020-10-11 NOTE — Progress Notes (Signed)
*  PRELIMINARY RESULTS* Echocardiogram 2D Echocardiogram has been performed.  [name], [name] 10/11/2020, 10:14 AM

## 2020-10-11 NOTE — Progress Notes (Signed)
Pt drowsy upon awakening this am. Can only tell me her name and that she is at [facility]. Denies c/o at present. Cooperative, follow commands.

## 2020-10-11 NOTE — Care Management Obs Status (Signed)
MEDICARE OBSERVATION STATUS NOTIFICATION   Patient Details  Name: [name] MRN: [id number] Date of Birth: 1950/12/15   Medicare Observation Status Notification Given:  Yes    Leitha Bleak, RN 10/11/2020, 1:46 PM

## 2020-10-11 NOTE — Progress Notes (Signed)
Patient Demographics:    [name], is a 69 y.o. female, DOB - 1950-11-15, UUV:[id number]  Admit date - 10/10/2020   Admitting Physician Bryson Gavia Mariea Clonts, MD  Outpatient Primary MD for the patient is Gwenlyn Fudge, FNP  LOS - 0   Chief Complaint  Patient presents with  . 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 (HCC)   Acute lower UTI   DIASTOLIC HEART FAILURE, CHRONIC   Essential (primary) hypertension   Personal history of transient ischemic attack (TIA), and cerebral infarction without residual deficits   Paroxysmal atrial fibrillation (HCC)   Seizure disorder as sequela of cerebrovascular accident (HCC)   Major depressive disorder, recurrent episode, moderate (HCC)   Brief Summary:- 69 year old female who is a reformed smoker with past medical history relevant for prior stroke with encephalomalacia and brain atrophy, dementia,, diastolic dysfunction CHF, HTN, paroxysmal atrial fibrillation, history of seizures as well as major depressive disorder and history of EtOH abuse presents to the ED with altered mentation/confusion--- when patient arrived by EMS there was concern for possible stroke however patient was found to have normal speech, and she was moving all extremities appropriately patient was just globally confused, disoriented, agitated and restless being admitted for acute metabolic encephalopathy with concerns for UTI and possible stroke   A/p 1)Acute metabolic encephalopathy--- suspect most likely related to UTI  -CT head without acute findings, MRI brain  and MRA Head without acute findings--there is evidence of old stroke and notable intracranial vascular stenosis  carotid artery Dopplers without hemodynamically significant stenosis -Serum ammonia is not elevated -UDS positive for benzo by patient received lorazepam in the ED  2)GNR UTI--- probably contributing to #1 above, C/n  IV Rocephin pending final ID and sensitivity of GNR  3)Social/Ethics---   discussed case with patient's daughter Halynn Polinsky at [phone] -Attempted to reach patient's husband at [phone]--- unable to leave message -Patient is more coherent  4)History of seizure disorder/history of prior EtOH abuse--- lorazepam as ordered -Multivitamin as ordered -Continue Lamictal  5)Depressive disorder and dementia----CT head with atrophy -Continue trazodone 150 mg nightly, Seroquel 100 mg daily, -Cymbalta and BuSpar as ordered  6) history of prior CVA--- aspirin and Lipitor as ordered, stroke work-up as above #1  7)Hypokalemia---replace and recheck   Disposition/Need for in-Hospital Stay- patient unable to be discharged at this time due to --- acute metabolic encephalopathy and agitation most likely due to GNR UTI requiring IV antibiotics    Dispo: The patient is from: Home  Anticipated d/c is to: Home   Disposition/Need for in-Hospital Stay- patient unable to be discharged at this time due to UTI with acute metabolic encephalopathy requiring IV antibiotic* Barriers: Not Clinically Stable-   Code Status :  -  Code Status: Full Code   Consults  :  na  DVT Prophylaxis  :   - SCDs  heparin injection 5,000 Units Start: 10/10/20 1745 SCDs Start: 10/10/20 1730 Place TED hose Start: 10/10/20 1730    Lab Results  Component Value Date   PLT 273 10/10/2020    Inpatient Medications  Scheduled Meds: . aspirin  300 mg Rectal Daily  . atorvastatin  40 mg Oral QHS  . busPIRone  15 mg Oral BID  . calcium-vitamin D  1 tablet Oral BID  .  DULoxetine  60 mg Oral q morning - 10a  . folic acid  1 mg Oral Daily  . heparin  5,000 Units Subcutaneous Q8H  . lamoTRIgine  50 mg Oral BID  . multivitamin with minerals  1 tablet Oral Daily  . pantoprazole  40 mg Oral BID  . potassium chloride  40 mEq Oral Q3H  . QUEtiapine  100 mg Oral Daily  . sodium chloride flush  3 mL Intravenous Q12H  . sodium chloride flush  3 mL Intravenous Q12H  . thiamine  100 mg Oral Daily   Or  . thiamine  100 mg Intravenous Daily  . topiramate  50 mg Oral BID   Continuous Infusions: . sodium chloride 75 mL/hr at 10/10/20 2113  . sodium chloride    . cefTRIAXone (ROCEPHIN)  IV Stopped (10/10/20 1643)   PRN Meds:.sodium chloride, acetaminophen **OR** acetaminophen, bisacodyl, haloperidol lactate, LORazepam **OR** LORazepam, ondansetron **OR** ondansetron (ZOFRAN) IV, polyethylene glycol, sodium chloride flush    Anti-infectives (From admission, onward)   Start     Dose/Rate Route Frequency Ordered Stop   10/10/20 1445  cefTRIAXone (ROCEPHIN) 1 g in sodium chloride 0.9 % 100 mL IVPB        1 g 200 mL/hr over 30 Minutes Intravenous Every 24 hours 10/10/20 1431          Objective:   Vitals:   10/10/20 2043 10/10/20 2230 10/11/20 0200 10/11/20 0600  BP:  127/75 131/73 (!) 116/56  Pulse: (!) 59 62 68 72  Resp: 19 20 20 16   Temp:  97.6 F (36.4 C) 97.6 F (36.4 C) 97.9 F (36.6 C)  TempSrc:  Oral Oral Oral  SpO2: 95% 99% 100% 91%  Weight:      Height:        Wt Readings from Last 3 Encounters:  10/10/20 99.8 kg  01/02/20 99.4 kg  11/21/19 98.3 kg     Intake/Output Summary (Last 24 hours) at 10/11/2020 0819 Last data filed at 10/11/2020 0300 Gross per 24 hour  Intake 1017.99 ml  Output 500 ml  Net 517.99 ml     Physical Exam  Gen:- Awake Alert, less confused HEENT:- [place].AT, No sclera icterus Neck-Supple Neck,No JVD,.  Lungs-  CTAB , fair symmetrical air movement CV- S1, S2 normal, regular  Abd-  +ve B.Sounds, Abd Soft, No  tenderness, no CVA tenderness    Extremity/Skin:- No  edema, pedal pulses present  Psych-more coherent and cooperative  neuro-generalized weakness, no new focal deficits, no tremors   Data Review:   Micro Results Recent Results (from the past 240 hour(s))  Resp Panel by RT-PCR (Flu A&B, Covid) Nasopharyngeal Swab     Status: None   Collection Time: 10/10/20  5:56 PM   Specimen: Nasopharyngeal Swab; Nasopharyngeal(NP) swabs in vial transport medium  Result Value Ref Range Status   SARS Coronavirus 2 by RT PCR NEGATIVE NEGATIVE Final    Comment: (NOTE) SARS-CoV-2 target nucleic acids are NOT DETECTED.  The SARS-CoV-2 RNA is generally detectable in upper respiratory specimens during the acute phase of  infection. The lowest concentration of SARS-CoV-2 viral copies this assay can detect is 138 copies/mL. A negative result does not preclude SARS-Cov-2 infection and should not be used as the sole basis for treatment or other patient management decisions. A negative result may occur with  improper specimen collection/handling, submission of specimen other than nasopharyngeal swab, presence of viral mutation(s) within the areas targeted by this assay, and inadequate number of viral copies(<138 copies/mL). A negative result must be combined with clinical observations, patient history, and epidemiological information. The expected result is Negative.  Fact Sheet for Patients:  BloggerCourse.com  Fact Sheet for Healthcare Providers:  SeriousBroker.it  This test is no t yet approved or cleared by the [place] FDA and  has been authorized for detection and/or diagnosis of SARS-CoV-2 by FDA under an Emergency Use Authorization (EUA). This EUA will remain  in effect (meaning this test can be used) for the duration of the COVID-19 declaration under Section 564(b)(1) of the Act, 21 U.S.C.section 360bbb-3(b)(1), unless the authorization is  terminated  or revoked sooner.       Influenza A by PCR NEGATIVE NEGATIVE Final   Influenza B by PCR NEGATIVE NEGATIVE Final    Comment: (NOTE) The Xpert Xpress SARS-CoV-2/FLU/RSV plus assay is intended as an aid in the diagnosis of influenza from Nasopharyngeal swab specimens and should not be used as a sole basis for treatment. Nasal washings and aspirates are unacceptable for Xpert Xpress SARS-CoV-2/FLU/RSV testing.  Fact Sheet for Patients: BloggerCourse.com  Fact Sheet for Healthcare Providers: SeriousBroker.it  This test is not yet approved or cleared by the [place] FDA and has been authorized for detection and/or diagnosis of SARS-CoV-2 by FDA under an Emergency Use Authorization (EUA). This EUA will remain in effect (meaning this test can be used) for the duration of the COVID-19 declaration under Section 564(b)(1) of the Act, 21 U.S.C. section 360bbb-3(b)(1), unless the authorization is terminated or revoked.  Performed at [facility], [address], [place], [place] [address]     Radiology Reports DG Chest 1 View  Result Date: 10/10/2020 CLINICAL DATA:  Slow speech, possible stroke, history of stroke EXAM: CHEST  1 VIEW COMPARISON:  Portable exam 1029 hours compared 06/06/2013 FINDINGS: Normal heart size, mediastinal contours, and pulmonary vascularity. Lungs clear. No acute infiltrate, pleural effusion, or pneumothorax. Bones demineralized. Chronic RIGHT rotator cuff tear. IMPRESSION: No acute abnormalities. Electronically Signed   By: Ulyses Southward M.D.   On: 10/10/2020 11:02   CT HEAD WO CONTRAST  Result Date: 10/10/2020 CLINICAL DATA:  Mental status changes squamous slowed speech, history of hemorrhagic stroke EXAM: CT HEAD WITHOUT CONTRAST TECHNIQUE: Contiguous axial images were obtained from the base of the skull through the vertex without intravenous contrast. COMPARISON:  06/26/2020 FINDINGS: Brain:  Chronic left occipital infarct and encephalomalacia. Patchy hypoattenuation in deep and periventricular white matter bilaterally as before. No evidence of acute infarct, acute hemorrhage, midline shift, mass, or mass effect. There is mild ex vacuo dilatation of the occipital horn left lateral ventricle as before. Mild diffuse parenchymal atrophy. Vascular: No hyperdense vessel or unexpected calcification. Skull: Normal. Negative for fracture or focal lesion. Sinuses/Orbits: No acute finding. Other: None IMPRESSION: 1. No acute intracranial process. 2. Chronic left occipital infarct and encephalomalacia. 3. Atrophy and nonspecific white matter changes. Electronically Signed   By: Corlis Leak M.D.   On: 10/10/2020 11:27     CBC Recent Labs  Lab 10/10/20 1147  WBC 6.0  HGB 13.4  HCT 42.1  PLT 273  MCV 99.1  MCH 31.5  MCHC 31.8  RDW 16.6*  LYMPHSABS 1.7  MONOABS 0.5  EOSABS 0.1  BASOSABS 0.0    Chemistries  Recent Labs  Lab 10/10/20 1147 10/11/20 0458  NA 136 139  K 4.5 3.3*  CL 106 109  CO2 16* 19*  GLUCOSE 110* 85  BUN 18 17  CREATININE 0.89 0.71  CALCIUM 9.6 9.1  AST 26  --   ALT 17  --   ALKPHOS 111  --   BILITOT 0.9  --    ------------------------------------------------------------------------------------------------------------------ No results for input(s): CHOL, HDL, LDLCALC, TRIG, CHOLHDL, LDLDIRECT in the last 72 hours.  Lab Results  Component Value Date   HGBA1C 5.6 08/21/2013   ------------------------------------------------------------------------------------------------------------------ No results for input(s): TSH, T4TOTAL, T3FREE, THYROIDAB in the last 72 hours.  Invalid input(s): FREET3 ------------------------------------------------------------------------------------------------------------------ No results for input(s): VITAMINB12, FOLATE, FERRITIN, TIBC, IRON, RETICCTPCT in the last 72 hours.  Coagulation profile Recent Labs  Lab  10/10/20 1147  INR 1.1    No results for input(s): DDIMER in the last 72 hours.  Cardiac Enzymes No results for input(s): CKMB, TROPONINI, MYOGLOBIN in the last 168 hours.  Invalid input(s): CK ------------------------------------------------------------------------------------------------------------------ No results found for: BNP   Shon Hale M.D on 10/11/2020 at 8:19 AM  Go to [email or web address] - for contact info  [facility] - Office  [phone]

## 2020-10-11 NOTE — Progress Notes (Signed)
Pt is requesting Immodium AD. She states that she has an "issue" that requires her to take this medication four times a day, and that she has been taking it this way "for years". Pt has not had any stool since arrival to this unit. MD Courage notified of pt's request and rationale.

## 2020-10-11 NOTE — TOC Initial Note (Signed)
Transition of Care [facility]) - Initial/Assessment Note    Patient Details  Name: [name] MRN: [id number] Date of Birth: 02-09-1951  [facility] CM/SW Contact:    Boneta Lucks, RN Phone Number: 10/11/2020, 1:47 PM  Clinical Narrative:    Patient is in OBS status working up for CVA rule out. TOC consulted for substance abuse. TOC spoke with patient, she has not drank in 10 plus years. States she does not take in additive medication and does not need any resources.    TOC to follow for any discharge needs.           Expected Discharge Plan: Home/Self Care Barriers to Discharge: Continued Medical Work up   Patient Goals and CMS Choice Patient states their goals for this hospitalization and ongoing recovery are:: to go home. CMS Medicare.gov Compare Post Acute Care list provided to:: Patient    Expected Discharge Plan and Services Expected Discharge Plan: Home/Self Care       Living arrangements for the past 2 months: Single Family Home                   Prior Living Arrangements/Services Living arrangements for the past 2 months: Single Family Home Lives with:: Spouse          Need for Family Participation in Patient Care: Yes (Comment) Care giver support system in place?: Yes (comment)   Criminal Activity/Legal Involvement Pertinent to Current Situation/Hospitalization: No - Comment as needed  Activities of Daily Living Permission Sought/Granted   Emotional Assessment     Affect (typically observed): Accepting Orientation: : Oriented to Self,Oriented to Place,Oriented to  Time,Oriented to Situation Alcohol / Substance Use: Not Applicable Psych Involvement: No (comment)  Admission diagnosis:  TIA (transient ischemic attack) [G45.9] Acute cystitis without hematuria [N30.00] Altered mental status, unspecified altered mental status type [R41.82] AMS (altered mental status) [R41.82] Patient Active Problem List   Diagnosis Date Noted  . TIA  (transient ischemic attack) 10/10/2020  . Acute metabolic encephalopathy 40/07/2724  . Major depressive disorder, recurrent episode, moderate ([place]) 01/03/2020  . Unsteadiness on feet 11/21/2019  . Generalized anxiety disorder   . Edema, unspecified   . Gout   . Low back pain   . Essential (primary) hypertension   . 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 Insomnia, unspecified   . Panic disorder without agoraphobia   . Personal history of transient ischemic attack (TIA), and cerebral infarction without residual deficits   . Acute lower UTI   . Weakness   . Paroxysmal atrial fibrillation ([place]) 03/05/2017  . Stroke aborted by administration of thrombolytic agent ([place]) 03/05/2017  . Former smoker 03/03/2017  . Gastric bypass status for obesity 03/03/2017  . Seizure disorder as sequela of cerebrovascular accident ([place]) 03/03/2017  . Chronic bilateral thoracic back pain 06/16/2016  . Chronic diarrhea 06/16/2016  . Impaired mobility and ADLs 06/05/2016  . Neurocognitive deficits 06/05/2016  . History of stroke 06/05/2016  . Pelvic pain in female 11/19/2015  . Arthritis of knee 06/24/2013  . HYPERKALEMIA 07/07/2009  . DIASTOLIC HEART FAILURE, CHRONIC 07/07/2009  . DYSPNEA 07/07/2009   PCP:  Loman Brooklyn, FNP Pharmacy:   [facility] [place] [address] [place] [address] Phone: [phone] Fax: [phone]

## 2020-10-11 NOTE — Progress Notes (Signed)
Pt alert, oriented x4. Able to tell me name, DOB, date, location, etc without delay. Ate breakfast, denies c/o. States that she feels fine now. States needs her glasses and hopes her husband brings them today when he visits. Pt advised of planned tests (carotid ultrasound), states, "Well they just did one on my heart." Pt is correct. Advised pt of necessity of test and that she will also have an MRI today. Pt agreeable.

## 2020-10-11 NOTE — Progress Notes (Signed)
Pt down for diagnostic imaging via stretcher at 1045. Pt's husband to room, advised that pt is having tests at this time. He states understanding. He states that pt has had similar episode of confusion approx 4 months ago after she started taking diet pills. He also states that pt does not drink alcohol and has not drank in years. MD Courage notified.

## 2020-10-12 DIAGNOSIS — G9341 Metabolic encephalopathy: Secondary | ICD-10-CM | POA: Diagnosis not present

## 2020-10-12 LAB — URINE CULTURE
Culture: >=100000 — AB
Special Requests: NORMAL

## 2020-10-12 LAB — CBC
HCT: 36.7 % (ref 36.0–46.0)
Hemoglobin: 11.5 g/dL — ABNORMAL LOW (ref 12.0–15.0)
MCH: 31.7 pg (ref 26.0–34.0)
MCHC: 31.3 g/dL (ref 30.0–36.0)
MCV: 101.1 fL — ABNORMAL HIGH (ref 80.0–100.0)
Platelets: 214 10*3/uL (ref 150–400)
RBC: 3.63 MIL/uL — ABNORMAL LOW (ref 3.87–5.11)
RDW: 16.9 % — ABNORMAL HIGH (ref 11.5–15.5)
WBC: 4.2 10*3/uL (ref 4.0–10.5)
nRBC: 0 % (ref 0.0–0.2)

## 2020-10-12 LAB — BASIC METABOLIC PANEL
Anion gap: 9 (ref 5–15)
BUN: 14 mg/dL (ref 8–23)
CO2: 18 mmol/L — ABNORMAL LOW (ref 22–32)
Calcium: 9 mg/dL (ref 8.9–10.3)
Chloride: 114 mmol/L — ABNORMAL HIGH (ref 98–111)
Creatinine, Ser: 0.67 mg/dL (ref 0.44–1.00)
GFR, Estimated: >60 mL/min (ref >60)
Glucose, Bld: 102 mg/dL — ABNORMAL HIGH (ref 70–99)
Potassium: 3.6 mmol/L (ref 3.5–5.1)
Sodium: 141 mmol/L (ref 135–145)

## 2020-10-12 MED ORDER — FUROSEMIDE 20 MG PO TABS: 20.0000 mg | ORAL_TABLET | Freq: Every day | ORAL | 1 refills | Status: AC

## 2020-10-12 MED ORDER — CEPHALEXIN 500 MG PO CAPS: 500.0000 mg | ORAL_CAPSULE | Freq: Three times a day (TID) | ORAL | 0 refills | Status: AC

## 2020-10-12 MED ORDER — ACETAMINOPHEN 325 MG PO TABS: 650.0000 mg | ORAL_TABLET | Freq: Four times a day (QID) | ORAL | 2 refills | Status: AC | PRN

## 2020-10-12 MED ORDER — ASPIRIN EC 81 MG PO TBEC: 81.0000 mg | DELAYED_RELEASE_TABLET | Freq: Every day | ORAL | 2 refills | Status: DC

## 2020-10-12 MED ORDER — ATORVASTATIN CALCIUM 40 MG PO TABS: 40.0000 mg | ORAL_TABLET | Freq: Every day | ORAL | 3 refills | Status: AC

## 2020-10-12 MED ORDER — SODIUM BICARBONATE 650 MG PO TABS: 650.0000 mg | ORAL_TABLET | Freq: Every day | ORAL | 0 refills | Status: AC

## 2020-10-12 NOTE — Discharge Summary (Signed)
[name], is a 69 y.o. female  DOB 1950/11/18  MRN [id number].  Admission date:  10/10/2020  Admitting Physician  Roxan Hockey, MD  Discharge Date:  10/12/2020   Primary MD  Loman Brooklyn, FNP  Recommendations for primary care physician for things to follow:   1) the Arterial vessels in your head and neck are narrow/clogged--- -this increases your risk for stroke  -please take aspirin and Lipitor daily as prescribed to reduce your risk for stroke and please follow-up with neurologist Dr. Merlene Laughter number 3 weeks for reevaluation  2)Please follow-up with Neurologist Dr. Phillips Odor-- Phone: [phone], Address: [address], [place], [place] [address] in 3 weeks for recheck and reevaluation.  Please call to make appointment with him  3) please note that there has been modifications/changes to your medications  4) you need a repeat BMP blood work with your primary care physician in a couple weeks  Admission Diagnosis  TIA (transient ischemic attack) [G45.9] Acute cystitis without hematuria [N30.00] Altered mental status, unspecified altered mental status type [R41.82] AMS (altered mental status) [R41.82]   Discharge Diagnosis  TIA (transient ischemic attack) [G45.9] Acute cystitis without hematuria [N30.00] Altered mental status, unspecified altered mental status type [R41.82] AMS (altered mental status) [R41.82]    Principal Problem:   Acute metabolic encephalopathy Active Problems:   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 (HCC)   Acute lower UTI   DIASTOLIC HEART FAILURE, CHRONIC   Essential (primary) hypertension   Personal history of transient ischemic attack (TIA), and cerebral infarction without residual deficits   Paroxysmal atrial fibrillation (HCC)   Seizure disorder as sequela of  cerebrovascular accident ([facility])   Major depressive disorder, recurrent episode, moderate ([place])      Past Medical History:  Diagnosis Date   Alcohol abuse    Anxiety    Arthritis    Chronic back pain    radiating to bilat legs/weakness   Chronic knee pain    Compression fracture of T11 vertebra (HCC)    Edema    Gout    Hemiplegia and hemiparesis following cerebral infarction affecting right dominant side ([facility])    Hemorrhagic stroke ([facility])    History of dental problems    Hypertension    Insomnia, unspecified    Low back pain    Major depressive disorder, single episode, moderate (HCC)    PAF (paroxysmal atrial fibrillation) (HCC)    Pain in joint involving shoulder region    Panic disorder without agoraphobia    Pelvic pain    Seizures ([place])    Spinal stenosis, lumbar    Stroke [facility])    old left posterior parietal/temporal/occipital lobe infarct; TIA 5 - 2018 with aphasia, given tPA   Subarachnoid hemorrhage ([facility])    Unspecified sequelae of unspecified cerebrovascular disease     Past Surgical History:  Procedure Laterality Date   BARIATRIC SURGERY     BIOPSY N/A 12/02/2015   Procedure: BIOPSY;  Minimal calcified plaque at the level of the distal bulb. No evidence of left ICA plaque or stenosis. The distal left ICA is tortuous. LEFT VERTEBRAL ARTERY: Antegrade flow with normal waveform and velocity. IMPRESSION: Minimal plaque at the level of both carotid bulbs and the proximal right ICA. Estimated right ICA stenosis is less than 50%. There is no evidence of left ICA stenosis in the neck. Electronically Signed   By: Aletta Edouard M.D.   On: 10/11/2020 12:56   ECHOCARDIOGRAM COMPLETE  Result Date: 10/11/2020    ECHOCARDIOGRAM REPORT   Patient Name:   [name] Date of Exam: 10/11/2020 Medical Rec #:  [id number]   Height:       68.0 in Accession #:    [id number]  Weight:       220.0 lb Date of Birth:  [birth year]  BSA:          2.128 m Patient Age:    38 years    BP:           119/72 mmHg Patient Gender: F           HR:           78 bpm. Exam Location:  [facility] Procedure: 2D Echo Indications:    Stroke 434.91 / I163.9  History:        Patient has no prior history of Echocardiogram examinations.                 TIA, Arrythmias:Atrial Fibrillation, Signs/Symptoms:Dyspnea;                 Risk Factors:Hypertension and Former Smoker.  Sonographer:    Leavy Cella RDCS (AE) Referring Phys: [id number] Pearline Yerby IMPRESSIONS  1. Left  ventricular ejection fraction, by estimation, is >75%. The left ventricle has hyperdynamic function. The left ventricle has no regional wall motion abnormalities. There is mild left ventricular hypertrophy. Left ventricular diastolic parameters are indeterminate.  2. Right ventricular systolic function is normal. The right ventricular size is normal.  3. The mitral valve is normal in structure. Trivial mitral valve regurgitation.  4. The aortic valve is normal in structure. Aortic valve regurgitation is not visualized.  5. The inferior vena cava is normal in size with greater than 50% respiratory variability, suggesting right atrial pressure of 3 mmHg.  6. No obvious shunt though bubble study not done. FINDINGS  Left Ventricle: Left ventricular ejection fraction, by estimation, is >75%. The left ventricle has hyperdynamic function. The left ventricle has no regional wall motion abnormalities. The left ventricular internal cavity size was normal in size. There is mild left ventricular hypertrophy. Left ventricular diastolic parameters are indeterminate. Right Ventricle: The right ventricular size is normal. Right vetricular wall thickness was not assessed. Right ventricular systolic function is normal. Left Atrium: Left atrial size was normal in size. Right Atrium: Right atrial size was normal in size. Pericardium: Trivial pericardial effusion is present. Mitral Valve: The mitral valve is normal in structure. Trivial mitral valve regurgitation. Tricuspid Valve: The tricuspid valve is normal in structure. Tricuspid valve regurgitation is mild. Aortic Valve: The aortic valve is normal in structure. Aortic valve regurgitation is not visualized. Pulmonic Valve: The pulmonic valve was not well visualized. Pulmonic valve regurgitation is not visualized. No evidence of pulmonic stenosis. Aorta: The aortic root is normal in size and structure. Venous: The inferior vena cava is normal in size with greater than 50% respiratory  variability, suggesting right atrial pressure of 3 mmHg. IAS/Shunts: No  Minimal calcified plaque at the level of the distal bulb. No evidence of left ICA plaque or stenosis. The distal left ICA is tortuous. LEFT VERTEBRAL ARTERY: Antegrade flow with normal waveform and velocity. IMPRESSION: Minimal plaque at the level of both carotid bulbs and the proximal right ICA. Estimated right ICA stenosis is less than 50%. There is no evidence of left ICA stenosis in the neck. Electronically Signed   By: Aletta Edouard M.D.   On: 10/11/2020 12:56   ECHOCARDIOGRAM COMPLETE  Result Date: 10/11/2020    ECHOCARDIOGRAM REPORT   Patient Name:   [name] Date of Exam: 10/11/2020 Medical Rec #:  [id number]   Height:       68.0 in Accession #:    [id number]  Weight:       220.0 lb Date of Birth:  [birth year]  BSA:          2.128 m Patient Age:    38 years    BP:           119/72 mmHg Patient Gender: F           HR:           78 bpm. Exam Location:  [facility] Procedure: 2D Echo Indications:    Stroke 434.91 / I163.9  History:        Patient has no prior history of Echocardiogram examinations.                 TIA, Arrythmias:Atrial Fibrillation, Signs/Symptoms:Dyspnea;                 Risk Factors:Hypertension and Former Smoker.  Sonographer:    Leavy Cella RDCS (AE) Referring Phys: [id number] Pearline Yerby IMPRESSIONS  1. Left  ventricular ejection fraction, by estimation, is >75%. The left ventricle has hyperdynamic function. The left ventricle has no regional wall motion abnormalities. There is mild left ventricular hypertrophy. Left ventricular diastolic parameters are indeterminate.  2. Right ventricular systolic function is normal. The right ventricular size is normal.  3. The mitral valve is normal in structure. Trivial mitral valve regurgitation.  4. The aortic valve is normal in structure. Aortic valve regurgitation is not visualized.  5. The inferior vena cava is normal in size with greater than 50% respiratory variability, suggesting right atrial pressure of 3 mmHg.  6. No obvious shunt though bubble study not done. FINDINGS  Left Ventricle: Left ventricular ejection fraction, by estimation, is >75%. The left ventricle has hyperdynamic function. The left ventricle has no regional wall motion abnormalities. The left ventricular internal cavity size was normal in size. There is mild left ventricular hypertrophy. Left ventricular diastolic parameters are indeterminate. Right Ventricle: The right ventricular size is normal. Right vetricular wall thickness was not assessed. Right ventricular systolic function is normal. Left Atrium: Left atrial size was normal in size. Right Atrium: Right atrial size was normal in size. Pericardium: Trivial pericardial effusion is present. Mitral Valve: The mitral valve is normal in structure. Trivial mitral valve regurgitation. Tricuspid Valve: The tricuspid valve is normal in structure. Tricuspid valve regurgitation is mild. Aortic Valve: The aortic valve is normal in structure. Aortic valve regurgitation is not visualized. Pulmonic Valve: The pulmonic valve was not well visualized. Pulmonic valve regurgitation is not visualized. No evidence of pulmonic stenosis. Aorta: The aortic root is normal in size and structure. Venous: The inferior vena cava is normal in size with greater than 50% respiratory  variability, suggesting right atrial pressure of 3 mmHg. IAS/Shunts: No  Minimal calcified plaque at the level of the distal bulb. No evidence of left ICA plaque or stenosis. The distal left ICA is tortuous. LEFT VERTEBRAL ARTERY: Antegrade flow with normal waveform and velocity. IMPRESSION: Minimal plaque at the level of both carotid bulbs and the proximal right ICA. Estimated right ICA stenosis is less than 50%. There is no evidence of left ICA stenosis in the neck. Electronically Signed   By: Aletta Edouard M.D.   On: 10/11/2020 12:56   ECHOCARDIOGRAM COMPLETE  Result Date: 10/11/2020    ECHOCARDIOGRAM REPORT   Patient Name:   [name] Date of Exam: 10/11/2020 Medical Rec #:  [id number]   Height:       68.0 in Accession #:    [id number]  Weight:       220.0 lb Date of Birth:  [birth year]  BSA:          2.128 m Patient Age:    38 years    BP:           119/72 mmHg Patient Gender: F           HR:           78 bpm. Exam Location:  [facility] Procedure: 2D Echo Indications:    Stroke 434.91 / I163.9  History:        Patient has no prior history of Echocardiogram examinations.                 TIA, Arrythmias:Atrial Fibrillation, Signs/Symptoms:Dyspnea;                 Risk Factors:Hypertension and Former Smoker.  Sonographer:    Leavy Cella RDCS (AE) Referring Phys: [id number] Pearline Yerby IMPRESSIONS  1. Left  ventricular ejection fraction, by estimation, is >75%. The left ventricle has hyperdynamic function. The left ventricle has no regional wall motion abnormalities. There is mild left ventricular hypertrophy. Left ventricular diastolic parameters are indeterminate.  2. Right ventricular systolic function is normal. The right ventricular size is normal.  3. The mitral valve is normal in structure. Trivial mitral valve regurgitation.  4. The aortic valve is normal in structure. Aortic valve regurgitation is not visualized.  5. The inferior vena cava is normal in size with greater than 50% respiratory variability, suggesting right atrial pressure of 3 mmHg.  6. No obvious shunt though bubble study not done. FINDINGS  Left Ventricle: Left ventricular ejection fraction, by estimation, is >75%. The left ventricle has hyperdynamic function. The left ventricle has no regional wall motion abnormalities. The left ventricular internal cavity size was normal in size. There is mild left ventricular hypertrophy. Left ventricular diastolic parameters are indeterminate. Right Ventricle: The right ventricular size is normal. Right vetricular wall thickness was not assessed. Right ventricular systolic function is normal. Left Atrium: Left atrial size was normal in size. Right Atrium: Right atrial size was normal in size. Pericardium: Trivial pericardial effusion is present. Mitral Valve: The mitral valve is normal in structure. Trivial mitral valve regurgitation. Tricuspid Valve: The tricuspid valve is normal in structure. Tricuspid valve regurgitation is mild. Aortic Valve: The aortic valve is normal in structure. Aortic valve regurgitation is not visualized. Pulmonic Valve: The pulmonic valve was not well visualized. Pulmonic valve regurgitation is not visualized. No evidence of pulmonic stenosis. Aorta: The aortic root is normal in size and structure. Venous: The inferior vena cava is normal in size with greater than 50% respiratory  variability, suggesting right atrial pressure of 3 mmHg. IAS/Shunts: No  Minimal calcified plaque at the level of the distal bulb. No evidence of left ICA plaque or stenosis. The distal left ICA is tortuous. LEFT VERTEBRAL ARTERY: Antegrade flow with normal waveform and velocity. IMPRESSION: Minimal plaque at the level of both carotid bulbs and the proximal right ICA. Estimated right ICA stenosis is less than 50%. There is no evidence of left ICA stenosis in the neck. Electronically Signed   By: Aletta Edouard M.D.   On: 10/11/2020 12:56   ECHOCARDIOGRAM COMPLETE  Result Date: 10/11/2020    ECHOCARDIOGRAM REPORT   Patient Name:   [name] Date of Exam: 10/11/2020 Medical Rec #:  [id number]   Height:       68.0 in Accession #:    [id number]  Weight:       220.0 lb Date of Birth:  [birth year]  BSA:          2.128 m Patient Age:    38 years    BP:           119/72 mmHg Patient Gender: F           HR:           78 bpm. Exam Location:  [facility] Procedure: 2D Echo Indications:    Stroke 434.91 / I163.9  History:        Patient has no prior history of Echocardiogram examinations.                 TIA, Arrythmias:Atrial Fibrillation, Signs/Symptoms:Dyspnea;                 Risk Factors:Hypertension and Former Smoker.  Sonographer:    Leavy Cella RDCS ([place]) Referring Phys: [id number] Pearline Yerby IMPRESSIONS  1. Left  ventricular ejection fraction, by estimation, is >75%. The left ventricle has hyperdynamic function. The left ventricle has no regional wall motion abnormalities. There is mild left ventricular hypertrophy. Left ventricular diastolic parameters are indeterminate.  2. Right ventricular systolic function is normal. The right ventricular size is normal.  3. The mitral valve is normal in structure. Trivial mitral valve regurgitation.  4. The aortic valve is normal in structure. Aortic valve regurgitation is not visualized.  5. The inferior vena cava is normal in size with greater than 50% respiratory variability, suggesting right atrial pressure of 3 mmHg.  6. No obvious shunt though bubble study not done. FINDINGS  Left Ventricle: Left ventricular ejection fraction, by estimation, is >75%. The left ventricle has hyperdynamic function. The left ventricle has no regional wall motion abnormalities. The left ventricular internal cavity size was normal in size. There is mild left ventricular hypertrophy. Left ventricular diastolic parameters are indeterminate. Right Ventricle: The right ventricular size is normal. Right vetricular wall thickness was not assessed. Right ventricular systolic function is normal. Left Atrium: Left atrial size was normal in size. Right Atrium: Right atrial size was normal in size. Pericardium: Trivial pericardial effusion is present. Mitral Valve: The mitral valve is normal in structure. Trivial mitral valve regurgitation. Tricuspid Valve: The tricuspid valve is normal in structure. Tricuspid valve regurgitation is mild. Aortic Valve: The aortic valve is normal in structure. Aortic valve regurgitation is not visualized. Pulmonic Valve: The pulmonic valve was not well visualized. Pulmonic valve regurgitation is not visualized. No evidence of pulmonic stenosis. Aorta: The aortic root is normal in size and structure. Venous: The inferior vena cava is normal in size with greater than 50% respiratory  variability, suggesting right atrial pressure of 3 mmHg. IAS/Shunts: No  [name], is a 69 y.o. female  DOB 1950/11/18  MRN [id number].  Admission date:  10/10/2020  Admitting Physician  Roxan Hockey, MD  Discharge Date:  10/12/2020   Primary MD  Loman Brooklyn, FNP  Recommendations for primary care physician for things to follow:   1) the Arterial vessels in your head and neck are narrow/clogged--- -this increases your risk for stroke  -please take aspirin and Lipitor daily as prescribed to reduce your risk for stroke and please follow-up with neurologist Dr. Merlene Laughter number 3 weeks for reevaluation  2)Please follow-up with Neurologist Dr. Phillips Odor-- Phone: [phone], Address: [address], [place], [place] [address] in 3 weeks for recheck and reevaluation.  Please call to make appointment with him  3) please note that there has been modifications/changes to your medications  4) you need a repeat BMP blood work with your primary care physician in a couple weeks  Admission Diagnosis  TIA (transient ischemic attack) [G45.9] Acute cystitis without hematuria [N30.00] Altered mental status, unspecified altered mental status type [R41.82] AMS (altered mental status) [R41.82]   Discharge Diagnosis  TIA (transient ischemic attack) [G45.9] Acute cystitis without hematuria [N30.00] Altered mental status, unspecified altered mental status type [R41.82] AMS (altered mental status) [R41.82]    Principal Problem:   Acute metabolic encephalopathy Active Problems:   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 (HCC)   Acute lower UTI   DIASTOLIC HEART FAILURE, CHRONIC   Essential (primary) hypertension   Personal history of transient ischemic attack (TIA), and cerebral infarction without residual deficits   Paroxysmal atrial fibrillation (HCC)   Seizure disorder as sequela of  cerebrovascular accident ([place])   Major depressive disorder, recurrent episode, moderate ([place])      Past Medical History:  Diagnosis Date   Alcohol abuse    Anxiety    Arthritis    Chronic back pain    radiating to bilat legs/weakness   Chronic knee pain    Compression fracture of T11 vertebra (HCC)    Edema    Gout    Hemiplegia and hemiparesis following cerebral infarction affecting right dominant side ([facility])    Hemorrhagic stroke ([place])    History of dental problems    Hypertension    Insomnia, unspecified    Low back pain    Major depressive disorder, single episode, moderate (HCC)    PAF (paroxysmal atrial fibrillation) (HCC)    Pain in joint involving shoulder region    Panic disorder without agoraphobia    Pelvic pain    Seizures ([place])    Spinal stenosis, lumbar    Stroke [facility])    old left posterior parietal/temporal/occipital lobe infarct; TIA 5 - 2018 with aphasia, given tPA   Subarachnoid hemorrhage ([place])    Unspecified sequelae of unspecified cerebrovascular disease     Past Surgical History:  Procedure Laterality Date   BARIATRIC SURGERY     BIOPSY N/A 12/02/2015   Procedure: BIOPSY;  Minimal calcified plaque at the level of the distal bulb. No evidence of left ICA plaque or stenosis. The distal left ICA is tortuous. LEFT VERTEBRAL ARTERY: Antegrade flow with normal waveform and velocity. IMPRESSION: Minimal plaque at the level of both carotid bulbs and the proximal right ICA. Estimated right ICA stenosis is less than 50%. There is no evidence of left ICA stenosis in the neck. Electronically Signed   By: Aletta Edouard M.D.   On: 10/11/2020 12:56   ECHOCARDIOGRAM COMPLETE  Result Date: 10/11/2020    ECHOCARDIOGRAM REPORT   Patient Name:   [name] Date of Exam: 10/11/2020 Medical Rec #:  [id number]   Height:       68.0 in Accession #:    [id number]  Weight:       220.0 lb Date of Birth:  [birth year]  BSA:          2.128 m Patient Age:    38 years    BP:           119/72 mmHg Patient Gender: F           HR:           78 bpm. Exam Location:  [facility] Procedure: 2D Echo Indications:    Stroke 434.91 / I163.9  History:        Patient has no prior history of Echocardiogram examinations.                 TIA, Arrythmias:Atrial Fibrillation, Signs/Symptoms:Dyspnea;                 Risk Factors:Hypertension and Former Smoker.  Sonographer:    Leavy Cella RDCS ([place]) Referring Phys: [id number] Pearline Yerby IMPRESSIONS  1. Left  ventricular ejection fraction, by estimation, is >75%. The left ventricle has hyperdynamic function. The left ventricle has no regional wall motion abnormalities. There is mild left ventricular hypertrophy. Left ventricular diastolic parameters are indeterminate.  2. Right ventricular systolic function is normal. The right ventricular size is normal.  3. The mitral valve is normal in structure. Trivial mitral valve regurgitation.  4. The aortic valve is normal in structure. Aortic valve regurgitation is not visualized.  5. The inferior vena cava is normal in size with greater than 50% respiratory variability, suggesting right atrial pressure of 3 mmHg.  6. No obvious shunt though bubble study not done. FINDINGS  Left Ventricle: Left ventricular ejection fraction, by estimation, is >75%. The left ventricle has hyperdynamic function. The left ventricle has no regional wall motion abnormalities. The left ventricular internal cavity size was normal in size. There is mild left ventricular hypertrophy. Left ventricular diastolic parameters are indeterminate. Right Ventricle: The right ventricular size is normal. Right vetricular wall thickness was not assessed. Right ventricular systolic function is normal. Left Atrium: Left atrial size was normal in size. Right Atrium: Right atrial size was normal in size. Pericardium: Trivial pericardial effusion is present. Mitral Valve: The mitral valve is normal in structure. Trivial mitral valve regurgitation. Tricuspid Valve: The tricuspid valve is normal in structure. Tricuspid valve regurgitation is mild. Aortic Valve: The aortic valve is normal in structure. Aortic valve regurgitation is not visualized. Pulmonic Valve: The pulmonic valve was not well visualized. Pulmonic valve regurgitation is not visualized. No evidence of pulmonic stenosis. Aorta: The aortic root is normal in size and structure. Venous: The inferior vena cava is normal in size with greater than 50% respiratory  variability, suggesting right atrial pressure of 3 mmHg. IAS/Shunts: No  Minimal calcified plaque at the level of the distal bulb. No evidence of left ICA plaque or stenosis. The distal left ICA is tortuous. LEFT VERTEBRAL ARTERY: Antegrade flow with normal waveform and velocity. IMPRESSION: Minimal plaque at the level of both carotid bulbs and the proximal right ICA. Estimated right ICA stenosis is less than 50%. There is no evidence of left ICA stenosis in the neck. Electronically Signed   By: Aletta Edouard M.D.   On: 10/11/2020 12:56   ECHOCARDIOGRAM COMPLETE  Result Date: 10/11/2020    ECHOCARDIOGRAM REPORT   Patient Name:   [name] Date of Exam: 10/11/2020 Medical Rec #:  [id number]   Height:       68.0 in Accession #:    [id number]  Weight:       220.0 lb Date of Birth:  [birth year]  BSA:          2.128 m Patient Age:    38 years    BP:           119/72 mmHg Patient Gender: F           HR:           78 bpm. Exam Location:  [facility] Procedure: 2D Echo Indications:    Stroke 434.91 / I163.9  History:        Patient has no prior history of Echocardiogram examinations.                 TIA, Arrythmias:Atrial Fibrillation, Signs/Symptoms:Dyspnea;                 Risk Factors:Hypertension and Former Smoker.  Sonographer:    Leavy Cella RDCS ([place]) Referring Phys: [id number] Pearline Yerby IMPRESSIONS  1. Left  ventricular ejection fraction, by estimation, is >75%. The left ventricle has hyperdynamic function. The left ventricle has no regional wall motion abnormalities. There is mild left ventricular hypertrophy. Left ventricular diastolic parameters are indeterminate.  2. Right ventricular systolic function is normal. The right ventricular size is normal.  3. The mitral valve is normal in structure. Trivial mitral valve regurgitation.  4. The aortic valve is normal in structure. Aortic valve regurgitation is not visualized.  5. The inferior vena cava is normal in size with greater than 50% respiratory variability, suggesting right atrial pressure of 3 mmHg.  6. No obvious shunt though bubble study not done. FINDINGS  Left Ventricle: Left ventricular ejection fraction, by estimation, is >75%. The left ventricle has hyperdynamic function. The left ventricle has no regional wall motion abnormalities. The left ventricular internal cavity size was normal in size. There is mild left ventricular hypertrophy. Left ventricular diastolic parameters are indeterminate. Right Ventricle: The right ventricular size is normal. Right vetricular wall thickness was not assessed. Right ventricular systolic function is normal. Left Atrium: Left atrial size was normal in size. Right Atrium: Right atrial size was normal in size. Pericardium: Trivial pericardial effusion is present. Mitral Valve: The mitral valve is normal in structure. Trivial mitral valve regurgitation. Tricuspid Valve: The tricuspid valve is normal in structure. Tricuspid valve regurgitation is mild. Aortic Valve: The aortic valve is normal in structure. Aortic valve regurgitation is not visualized. Pulmonic Valve: The pulmonic valve was not well visualized. Pulmonic valve regurgitation is not visualized. No evidence of pulmonic stenosis. Aorta: The aortic root is normal in size and structure. Venous: The inferior vena cava is normal in size with greater than 50% respiratory  variability, suggesting right atrial pressure of 3 mmHg. IAS/Shunts: No  [name], is a 69 y.o. female  DOB 1950/11/18  MRN [id number].  Admission date:  10/10/2020  Admitting Physician  Roxan Hockey, MD  Discharge Date:  10/12/2020   Primary MD  Loman Brooklyn, FNP  Recommendations for primary care physician for things to follow:   1) the Arterial vessels in your head and neck are narrow/clogged--- -this increases your risk for stroke  -please take aspirin and Lipitor daily as prescribed to reduce your risk for stroke and please follow-up with neurologist Dr. Merlene Laughter number 3 weeks for reevaluation  2)Please follow-up with Neurologist Dr. Phillips Odor-- Phone: [phone], Address: [address], [place], [place] [address] in 3 weeks for recheck and reevaluation.  Please call to make appointment with him  3) please note that there has been modifications/changes to your medications  4) you need a repeat BMP blood work with your primary care physician in a couple weeks  Admission Diagnosis  TIA (transient ischemic attack) [G45.9] Acute cystitis without hematuria [N30.00] Altered mental status, unspecified altered mental status type [R41.82] AMS (altered mental status) [R41.82]   Discharge Diagnosis  TIA (transient ischemic attack) [G45.9] Acute cystitis without hematuria [N30.00] Altered mental status, unspecified altered mental status type [R41.82] AMS (altered mental status) [R41.82]    Principal Problem:   Acute metabolic encephalopathy Active Problems:   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 (HCC)   Acute lower UTI   DIASTOLIC HEART FAILURE, CHRONIC   Essential (primary) hypertension   Personal history of transient ischemic attack (TIA), and cerebral infarction without residual deficits   Paroxysmal atrial fibrillation (HCC)   Seizure disorder as sequela of  cerebrovascular accident ([place])   Major depressive disorder, recurrent episode, moderate ([place])      Past Medical History:  Diagnosis Date   Alcohol abuse    Anxiety    Arthritis    Chronic back pain    radiating to bilat legs/weakness   Chronic knee pain    Compression fracture of T11 vertebra (HCC)    Edema    Gout    Hemiplegia and hemiparesis following cerebral infarction affecting right dominant side ([place])    Hemorrhagic stroke ([place])    History of dental problems    Hypertension    Insomnia, unspecified    Low back pain    Major depressive disorder, single episode, moderate (HCC)    PAF (paroxysmal atrial fibrillation) (HCC)    Pain in joint involving shoulder region    Panic disorder without agoraphobia    Pelvic pain    Seizures ([place])    Spinal stenosis, lumbar    Stroke [facility])    old left posterior parietal/temporal/occipital lobe infarct; TIA 5 - 2018 with aphasia, given tPA   Subarachnoid hemorrhage ([place])    Unspecified sequelae of unspecified cerebrovascular disease     Past Surgical History:  Procedure Laterality Date   BARIATRIC SURGERY     BIOPSY N/A 12/02/2015   Procedure: BIOPSY;  [name], is a 69 y.o. female  DOB 1950/11/18  MRN [id number].  Admission date:  10/10/2020  Admitting Physician  Roxan Hockey, MD  Discharge Date:  10/12/2020   Primary MD  Loman Brooklyn, FNP  Recommendations for primary care physician for things to follow:   1) the Arterial vessels in your head and neck are narrow/clogged--- -this increases your risk for stroke  -please take aspirin and Lipitor daily as prescribed to reduce your risk for stroke and please follow-up with neurologist Dr. Merlene Laughter number 3 weeks for reevaluation  2)Please follow-up with Neurologist Dr. Phillips Odor-- Phone: [phone], Address: [address], [place], [place] [address] in 3 weeks for recheck and reevaluation.  Please call to make appointment with him  3) please note that there has been modifications/changes to your medications  4) you need a repeat BMP blood work with your primary care physician in a couple weeks  Admission Diagnosis  TIA (transient ischemic attack) [G45.9] Acute cystitis without hematuria [N30.00] Altered mental status, unspecified altered mental status type [R41.82] AMS (altered mental status) [R41.82]   Discharge Diagnosis  TIA (transient ischemic attack) [G45.9] Acute cystitis without hematuria [N30.00] Altered mental status, unspecified altered mental status type [R41.82] AMS (altered mental status) [R41.82]    Principal Problem:   Acute metabolic encephalopathy Active Problems:   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 (HCC)   Acute lower UTI   DIASTOLIC HEART FAILURE, CHRONIC   Essential (primary) hypertension   Personal history of transient ischemic attack (TIA), and cerebral infarction without residual deficits   Paroxysmal atrial fibrillation (HCC)   Seizure disorder as sequela of  cerebrovascular accident ([place])   Major depressive disorder, recurrent episode, moderate ([place])      Past Medical History:  Diagnosis Date   Alcohol abuse    Anxiety    Arthritis    Chronic back pain    radiating to bilat legs/weakness   Chronic knee pain    Compression fracture of T11 vertebra (HCC)    Edema    Gout    Hemiplegia and hemiparesis following cerebral infarction affecting right dominant side ([place])    Hemorrhagic stroke ([place])    History of dental problems    Hypertension    Insomnia, unspecified    Low back pain    Major depressive disorder, single episode, moderate (HCC)    PAF (paroxysmal atrial fibrillation) (HCC)    Pain in joint involving shoulder region    Panic disorder without agoraphobia    Pelvic pain    Seizures ([place])    Spinal stenosis, lumbar    Stroke [facility])    old left posterior parietal/temporal/occipital lobe infarct; TIA 5 - 2018 with aphasia, given tPA   Subarachnoid hemorrhage ([place])    Unspecified sequelae of unspecified cerebrovascular disease     Past Surgical History:  Procedure Laterality Date   BARIATRIC SURGERY     BIOPSY N/A 12/02/2015   Procedure: BIOPSY;

## 2020-10-12 NOTE — Progress Notes (Signed)
Pt discharged home with husband. Discharge teaching given and no further questions at this time.

## 2020-10-12 NOTE — Discharge Instructions (Signed)
1) the Arterial vessels in your head and neck are narrow/clogged--- -this increases your risk for stroke  -please take aspirin and Lipitor daily as prescribed to reduce your risk for stroke and please follow-up with neurologist Dr. Gerilyn Pilgrim number 3 weeks for reevaluation  2)Please follow-up with Neurologist Dr. Beryle Beams-- Phone: [phone], Address: [address], [place], [place] [address] in 3 weeks for recheck and reevaluation.  Please call to make appointment with him  3) please note that there has been modifications/changes to your medications  4) you need a repeat BMP blood work with your primary care physician in a couple weeks

## 2020-10-13 ENCOUNTER — Telehealth: Payer: Self-pay

## 2020-10-13 NOTE — Telephone Encounter (Signed)
Transition Care Management Unsuccessful Follow-up Telephone Call  Date of discharge and from where:  10/14/20- [facility]  Attempts:  1st Attempt  Reason for unsuccessful TCM follow-up call:  Voice mail full

## 2020-10-14 NOTE — Telephone Encounter (Signed)
Transition Care Management Unsuccessful Follow-up Telephone Call  Date of discharge and from where:  10/12/20 [facility]  Attempts:  2nd Attempt  Reason for unsuccessful TCM follow-up call:  Mail box full cant leave message

## 2020-10-18 NOTE — Telephone Encounter (Signed)
2nd attempt was made by Austin Miles on 10/14/2020, mailbox was full.  Encounter closed.

## 2020-10-20 DIAGNOSIS — N3091 Cystitis, unspecified with hematuria: Secondary | ICD-10-CM | POA: Diagnosis not present

## 2020-11-02 ENCOUNTER — Other Ambulatory Visit: Payer: Self-pay | Admitting: Family Medicine

## 2020-11-11 ENCOUNTER — Other Ambulatory Visit: Payer: Self-pay | Admitting: Family Medicine

## 2020-11-11 DIAGNOSIS — Z0000 Encounter for general adult medical examination without abnormal findings: Secondary | ICD-10-CM | POA: Diagnosis not present

## 2020-11-11 DIAGNOSIS — E785 Hyperlipidemia, unspecified: Secondary | ICD-10-CM | POA: Diagnosis not present

## 2020-11-11 DIAGNOSIS — K219 Gastro-esophageal reflux disease without esophagitis: Secondary | ICD-10-CM | POA: Diagnosis not present

## 2020-11-11 DIAGNOSIS — N3091 Cystitis, unspecified with hematuria: Secondary | ICD-10-CM | POA: Diagnosis not present

## 2020-11-11 DIAGNOSIS — I10 Essential (primary) hypertension: Secondary | ICD-10-CM | POA: Diagnosis not present

## 2020-11-11 DIAGNOSIS — M10029 Idiopathic gout, unspecified elbow: Secondary | ICD-10-CM | POA: Diagnosis not present

## 2020-11-12 ENCOUNTER — Other Ambulatory Visit: Payer: Self-pay | Admitting: Family Medicine

## 2020-11-19 DIAGNOSIS — R6889 Other general symptoms and signs: Secondary | ICD-10-CM | POA: Diagnosis not present

## 2020-12-30 ENCOUNTER — Encounter (HOSPITAL_COMMUNITY): Payer: Self-pay

## 2020-12-30 ENCOUNTER — Observation Stay (HOSPITAL_COMMUNITY)
Admission: EM | Admit: 2020-12-30 | Discharge: 2021-01-01 | Disposition: A | Discharge status: Home / Self Care | Payer: Medicare Other | Attending: Internal Medicine | Admitting: Internal Medicine

## 2020-12-30 ENCOUNTER — Emergency Department (HOSPITAL_COMMUNITY): Payer: Medicare Other

## 2020-12-30 ENCOUNTER — Other Ambulatory Visit: Payer: Self-pay

## 2020-12-30 DIAGNOSIS — R0902 Hypoxemia: Secondary | ICD-10-CM | POA: Diagnosis not present

## 2020-12-30 DIAGNOSIS — Z87891 Personal history of nicotine dependence: Secondary | ICD-10-CM | POA: Insufficient documentation

## 2020-12-30 DIAGNOSIS — R410 Disorientation, unspecified: Secondary | ICD-10-CM | POA: Diagnosis not present

## 2020-12-30 DIAGNOSIS — R001 Bradycardia, unspecified: Secondary | ICD-10-CM

## 2020-12-30 DIAGNOSIS — R561 Post traumatic seizures: Secondary | ICD-10-CM | POA: Diagnosis not present

## 2020-12-30 DIAGNOSIS — I480 Paroxysmal atrial fibrillation: Secondary | ICD-10-CM | POA: Diagnosis present

## 2020-12-30 DIAGNOSIS — Z8673 Personal history of transient ischemic attack (TIA), and cerebral infarction without residual deficits: Secondary | ICD-10-CM

## 2020-12-30 DIAGNOSIS — I4581 Long QT syndrome: Secondary | ICD-10-CM | POA: Insufficient documentation

## 2020-12-30 DIAGNOSIS — R9431 Abnormal electrocardiogram [ECG] [EKG]: Secondary | ICD-10-CM | POA: Diagnosis present

## 2020-12-30 DIAGNOSIS — Z20822 Contact with and (suspected) exposure to covid-19: Secondary | ICD-10-CM | POA: Insufficient documentation

## 2020-12-30 DIAGNOSIS — R4182 Altered mental status, unspecified: Secondary | ICD-10-CM | POA: Diagnosis present

## 2020-12-30 DIAGNOSIS — Z79899 Other long term (current) drug therapy: Secondary | ICD-10-CM | POA: Insufficient documentation

## 2020-12-30 DIAGNOSIS — I69398 Other sequelae of cerebral infarction: Secondary | ICD-10-CM

## 2020-12-30 DIAGNOSIS — F32A Depression, unspecified: Secondary | ICD-10-CM | POA: Diagnosis not present

## 2020-12-30 DIAGNOSIS — E785 Hyperlipidemia, unspecified: Secondary | ICD-10-CM | POA: Diagnosis not present

## 2020-12-30 DIAGNOSIS — D7589 Other specified diseases of blood and blood-forming organs: Secondary | ICD-10-CM | POA: Diagnosis present

## 2020-12-30 DIAGNOSIS — K219 Gastro-esophageal reflux disease without esophagitis: Secondary | ICD-10-CM | POA: Diagnosis not present

## 2020-12-30 DIAGNOSIS — G9340 Encephalopathy, unspecified: Secondary | ICD-10-CM | POA: Diagnosis present

## 2020-12-30 DIAGNOSIS — F411 Generalized anxiety disorder: Secondary | ICD-10-CM | POA: Diagnosis present

## 2020-12-30 DIAGNOSIS — E876 Hypokalemia: Secondary | ICD-10-CM | POA: Diagnosis not present

## 2020-12-30 DIAGNOSIS — Z7982 Long term (current) use of aspirin: Secondary | ICD-10-CM | POA: Insufficient documentation

## 2020-12-30 DIAGNOSIS — I10 Essential (primary) hypertension: Secondary | ICD-10-CM | POA: Diagnosis present

## 2020-12-30 DIAGNOSIS — R404 Transient alteration of awareness: Secondary | ICD-10-CM | POA: Diagnosis not present

## 2020-12-30 DIAGNOSIS — M109 Gout, unspecified: Secondary | ICD-10-CM | POA: Diagnosis present

## 2020-12-30 DIAGNOSIS — R42 Dizziness and giddiness: Secondary | ICD-10-CM | POA: Diagnosis not present

## 2020-12-30 DIAGNOSIS — G40909 Epilepsy, unspecified, not intractable, without status epilepticus: Secondary | ICD-10-CM

## 2020-12-30 DIAGNOSIS — G9341 Metabolic encephalopathy: Secondary | ICD-10-CM | POA: Diagnosis not present

## 2020-12-30 LAB — CBC WITH DIFFERENTIAL/PLATELET
Abs Immature Granulocytes: 0.01 10*3/uL (ref 0.00–0.07)
Basophils Absolute: 0 10*3/uL (ref 0.0–0.1)
Basophils Relative: 1 %
Eosinophils Absolute: 0 10*3/uL (ref 0.0–0.5)
Eosinophils Relative: 0 %
HCT: 39.2 % (ref 36.0–46.0)
Hemoglobin: 12.5 g/dL (ref 12.0–15.0)
Immature Granulocytes: 0 %
Lymphocytes Relative: 18 %
Lymphs Abs: 0.9 10*3/uL (ref 0.7–4.0)
MCH: 32.2 pg (ref 26.0–34.0)
MCHC: 31.9 g/dL (ref 30.0–36.0)
MCV: 101 fL — ABNORMAL HIGH (ref 80.0–100.0)
Monocytes Absolute: 0.3 10*3/uL (ref 0.1–1.0)
Monocytes Relative: 5 %
Neutro Abs: 3.9 10*3/uL (ref 1.7–7.7)
Neutrophils Relative %: 76 %
Platelets: 216 10*3/uL (ref 150–400)
RBC: 3.88 MIL/uL (ref 3.87–5.11)
RDW: 15.2 % (ref 11.5–15.5)
WBC: 5.2 10*3/uL (ref 4.0–10.5)
nRBC: 0 % (ref 0.0–0.2)

## 2020-12-30 LAB — URINALYSIS, ROUTINE W REFLEX MICROSCOPIC
Bilirubin Urine: NEGATIVE
Glucose, UA: NEGATIVE mg/dL
Hgb urine dipstick: NEGATIVE
Ketones, ur: 5 mg/dL — AB
Leukocytes,Ua: NEGATIVE
Nitrite: NEGATIVE
Protein, ur: NEGATIVE mg/dL
Specific Gravity, Urine: 1.02 (ref 1.005–1.030)
pH: 6 (ref 5.0–8.0)

## 2020-12-30 LAB — RAPID URINE DRUG SCREEN, HOSP PERFORMED
Amphetamines: NOT DETECTED
Barbiturates: NOT DETECTED
Benzodiazepines: NOT DETECTED
Cocaine: NOT DETECTED
Opiates: NOT DETECTED
Tetrahydrocannabinol: NOT DETECTED

## 2020-12-30 LAB — COMPREHENSIVE METABOLIC PANEL
ALT: 16 U/L (ref 0–44)
AST: 23 U/L (ref 15–41)
Albumin: 4 g/dL (ref 3.5–5.0)
Alkaline Phosphatase: 70 U/L (ref 38–126)
Anion gap: 7 (ref 5–15)
BUN: 15 mg/dL (ref 8–23)
CO2: 17 mmol/L — ABNORMAL LOW (ref 22–32)
Calcium: 9.4 mg/dL (ref 8.9–10.3)
Chloride: 119 mmol/L — ABNORMAL HIGH (ref 98–111)
Creatinine, Ser: 1 mg/dL (ref 0.44–1.00)
GFR, Estimated: >60 mL/min (ref >60)
Glucose, Bld: 136 mg/dL — ABNORMAL HIGH (ref 70–99)
Potassium: 4.1 mmol/L (ref 3.5–5.1)
Sodium: 143 mmol/L (ref 135–145)
Total Bilirubin: 0.5 mg/dL (ref 0.3–1.2)
Total Protein: 6.6 g/dL (ref 6.5–8.1)

## 2020-12-30 LAB — AMMONIA: Ammonia: 15 umol/L (ref 9–35)

## 2020-12-30 LAB — ETHANOL: Alcohol, Ethyl (B): <10 mg/dL (ref <10)

## 2020-12-30 MED ORDER — DULOXETINE HCL 60 MG PO CPEP
60.0000 mg | ORAL_CAPSULE | Freq: Every morning | ORAL | Status: DC
  Administered 2020-12-31 – 2021-01-01 (×2): 60 mg via ORAL
  Filled 2020-12-30 (×2): qty 1

## 2020-12-30 MED ORDER — LAMOTRIGINE 25 MG PO TABS
50.0000 mg | ORAL_TABLET | Freq: Two times a day (BID) | ORAL | Status: DC
  Administered 2020-12-31 – 2021-01-01 (×4): 50 mg via ORAL
  Filled 2020-12-30 (×4): qty 2

## 2020-12-30 MED ORDER — ALLOPURINOL 300 MG PO TABS
300.0000 mg | ORAL_TABLET | Freq: Every day | ORAL | Status: DC
  Administered 2020-12-31 – 2021-01-01 (×2): 300 mg via ORAL
  Filled 2020-12-30 (×2): qty 1

## 2020-12-30 MED ORDER — ONDANSETRON HCL 4 MG PO TABS: 4.0000 mg | ORAL_TABLET | Freq: Four times a day (QID) | ORAL | Status: DC | PRN

## 2020-12-30 MED ORDER — ACETAMINOPHEN 325 MG PO TABS
650.0000 mg | ORAL_TABLET | Freq: Four times a day (QID) | ORAL | Status: DC | PRN
  Administered 2020-12-31: 650 mg via ORAL
  Filled 2020-12-30: qty 2

## 2020-12-30 MED ORDER — TOPIRAMATE 25 MG PO TABS
50.0000 mg | ORAL_TABLET | Freq: Two times a day (BID) | ORAL | Status: DC
  Administered 2020-12-31 – 2021-01-01 (×4): 50 mg via ORAL
  Filled 2020-12-30 (×4): qty 2

## 2020-12-30 MED ORDER — ACETAMINOPHEN 650 MG RE SUPP: 650.0000 mg | Freq: Four times a day (QID) | RECTAL | Status: DC | PRN

## 2020-12-30 MED ORDER — MAGNESIUM SULFATE 2 GM/50ML IV SOLN
2.0000 g | Freq: Once | INTRAVENOUS | Status: AC
  Administered 2020-12-31: 2 g via INTRAVENOUS
  Filled 2020-12-30: qty 50

## 2020-12-30 MED ORDER — POTASSIUM CHLORIDE CRYS ER 20 MEQ PO TBCR: 20.0000 meq | EXTENDED_RELEASE_TABLET | Freq: Every day | ORAL | Status: DC

## 2020-12-30 MED ORDER — ENOXAPARIN SODIUM 40 MG/0.4ML ~~LOC~~ SOLN
40.0000 mg | SUBCUTANEOUS | Status: DC
  Administered 2020-12-30 – 2020-12-31 (×2): 40 mg via SUBCUTANEOUS
  Filled 2020-12-30 (×2): qty 0.4

## 2020-12-30 MED ORDER — ATORVASTATIN CALCIUM 40 MG PO TABS
40.0000 mg | ORAL_TABLET | Freq: Every day | ORAL | Status: DC
  Administered 2020-12-31 (×2): 40 mg via ORAL
  Filled 2020-12-30 (×2): qty 1

## 2020-12-30 MED ORDER — ASPIRIN EC 81 MG PO TBEC
81.0000 mg | DELAYED_RELEASE_TABLET | Freq: Every day | ORAL | Status: DC
  Administered 2020-12-31 – 2021-01-01 (×2): 81 mg via ORAL
  Filled 2020-12-30 (×2): qty 1

## 2020-12-30 MED ORDER — ONDANSETRON HCL 4 MG/2ML IJ SOLN: 4.0000 mg | Freq: Four times a day (QID) | INTRAMUSCULAR | Status: DC | PRN

## 2020-12-30 MED ORDER — SODIUM CHLORIDE 0.45 % IV SOLN: INTRAVENOUS | Status: AC

## 2020-12-30 MED ORDER — PANTOPRAZOLE SODIUM 40 MG PO TBEC
40.0000 mg | DELAYED_RELEASE_TABLET | Freq: Two times a day (BID) | ORAL | Status: DC
  Administered 2020-12-31 – 2021-01-01 (×4): 40 mg via ORAL
  Filled 2020-12-30 (×4): qty 1

## 2020-12-30 MED ORDER — FUROSEMIDE 20 MG PO TABS
20.0000 mg | ORAL_TABLET | Freq: Every day | ORAL | Status: DC
  Administered 2020-12-31 – 2021-01-01 (×2): 20 mg via ORAL
  Filled 2020-12-30 (×2): qty 1

## 2020-12-30 NOTE — H&P (Signed)
History and Physical    [name] [id number] DOB: April 30, 1951 DOA: 12/30/2020  PCP: Gwenlyn Fudge, FNP   Patient coming from: Home.   I have personally briefly reviewed patient's old medical records in [facility]  Chief Complaint: AMS.   HPI: [name] is a 70 y.o. female with medical history significant of alcohol abuse, anxiety, osteoarthritis, chronic back pain, chronic knee pain, chronic T11 compression fracture, spinal stenosis, gout, right hemiparesis following unspecified CVA, history of hemorrhagic stroke/SAH, post CVA seizures, hypertension, major depressive disorder, panic disorder with agoraphobia, paroxysmal atrial fibrillation not on anticoagulation who is brought to the emergency department by EMS after her husband noticed that she has been very confused.  EMS arrived to the scene and found the patient naked in the bathroom turning circles and only oriented to herself.  She is unable to provide further history.  She had a similar presentation in December of last year, which was secondary to a transient ischemic attack and a urinary tract infection.  ED Course: Initial vital signs were temperature 97.8 F, pulse 71, respiration 20, BP 119/78 mmHg and O2 sat 97% on room air.  Labwork: Her urinalysis showed ketonuria 5 mg/dL, but was otherwise unremarkable.  UDS was negative.  CBC showed a white count of 5.2, hemoglobin 12.5 g/dL with an MCV of 578.4 fL and platelets of 216.  CMP shows a chloride of 119, CO2 of 17 mmol/L with a normal anion gap and her glucose 136 mg/dL.  All other values are normal.  Ammonia and ethanol level results were within expected range.  Imaging: A 2 view chest radiograph showed chronic T11 compression fracture.  There was no active cardiopulmonary disease.  CT head without contrast did not show any evidence of acute intracranial normality.  However, it did redemonstrate large chronic cortical/subcortical left PCA territory infarct.  Mild  generalized parenchymal atrophy and moderate chronic small vessel ischemic disease also seen.  Please see images and full radiology report for further detail.  Review of Systems: As per HPI otherwise all other systems reviewed and are negative.  Past Medical History:  Diagnosis Date  . Alcohol abuse   . Anxiety   . Arthritis   . Chronic back pain    radiating to bilat legs/weakness  . Chronic knee pain   . Compression fracture of T11 vertebra (HCC)   . Edema   . Gout   . Hemiplegia and hemiparesis following cerebral infarction affecting right dominant side (HCC)   . Hemorrhagic stroke (HCC)   . History of dental problems   . Hypertension   . Insomnia, unspecified   . Low back pain   . Major depressive disorder, single episode, moderate (HCC)   . PAF (paroxysmal atrial fibrillation) (HCC)   . Pain in joint involving shoulder region   . Panic disorder without agoraphobia   . Pelvic pain   . Seizures (HCC)   . Spinal stenosis, lumbar   . Stroke [facility])    old left posterior parietal/temporal/occipital lobe infarct; TIA 5 - 2018 with aphasia, given tPA  . Subarachnoid hemorrhage (HCC)   . Unspecified sequelae of unspecified cerebrovascular disease    Past Surgical History:  Procedure Laterality Date  . BARIATRIC SURGERY    . BIOPSY N/A 12/02/2015   Procedure: BIOPSY;  Surgeon: Malissa Hippo, MD;  Location: [facility];  Service: Endoscopy;  Laterality: N/A;  Random colon biopsies  . CHOLECYSTECTOMY    . COLONOSCOPY    .  COLONOSCOPY N/A 12/02/2015   Procedure: COLONOSCOPY;  Surgeon: Malissa Hippo, MD;  Location: [facility];  Service: Endoscopy;  Laterality: N/A;  110  . EYE SURGERY     as a child  . GASTRIC BYPASS     in 1985 for obesity  . POLYPECTOMY N/A 12/02/2015   Procedure: POLYPECTOMY;  Surgeon: Malissa Hippo, MD;  Location: [facility];  Service: Endoscopy;  Laterality: N/A;  Splenic flexure polyp  . right foot surgery     Social History  reports that she  has quit smoking. Her smoking use included cigarettes. She has never used smokeless tobacco. She reports that she does not drink alcohol and does not use drugs.  No Known Allergies  Family History  Problem Relation Age of Onset  . Healthy Mother   . Hodgkin's lymphoma Father   . Arthritis Sister    Prior to Admission medications   Medication Sig Start Date End Date Taking? Authorizing Provider  acetaminophen (TYLENOL) 325 MG tablet Take 2 tablets (650 mg total) by mouth every 6 (six) hours as needed for mild pain (or Fever >/= 101). 10/12/20   Shon Hale, MD  allopurinol (ZYLOPRIM) 300 MG tablet Take 300 mg by mouth daily. 08/29/18   [provider]  aspirin EC 81 MG tablet Take 1 tablet (81 mg total) by mouth daily with breakfast. 10/12/20 10/12/21  Shon Hale, MD  atorvastatin (LIPITOR) 40 MG tablet Take 1 tablet (40 mg total) by mouth at bedtime. 10/12/20   Shon Hale, MD  busPIRone (BUSPAR) 15 MG tablet Take 1 tablet (15 mg total) by mouth 2 (two) times daily. Need appointment for further refills. 04/15/20   Gwenlyn Fudge, FNP  calcium-vitamin D (OSCAL WITH D) 500-200 MG-UNIT tablet Take 1 tablet by mouth 2 (two) times daily.    [provider]  Cholecalciferol (VITAMIN D3) 25 MCG (1000 UT) CAPS Take by mouth.    [provider]  DULoxetine (CYMBALTA) 60 MG capsule Take 1 capsule (60 mg total) by mouth every morning. 01/22/20   Gwenlyn Fudge, FNP  furosemide (LASIX) 20 MG tablet Take 1 tablet (20 mg total) by mouth daily. 10/12/20   Shon Hale, MD  lamoTRIgine (LAMICTAL) 25 MG tablet Take 50 mg by mouth 2 (two) times daily. 08/29/18   [provider]  OVER THE COUNTER MEDICATION [place]- vite tablet    [provider]  pantoprazole (PROTONIX) 40 MG tablet Take 1 tablet (40 mg total) by mouth 2 (two) times daily. (Needs to be seen before next refill) 10/04/20   Dettinger, Elige Radon, MD  potassium chloride SA (KLOR-CON) 20  MEQ tablet Take 1 tablet (20 mEq total) by mouth daily. 01/22/20   Gwenlyn Fudge, FNP  QUEtiapine (SEROQUEL) 100 MG tablet Take 1 tablet (100 mg total) by mouth daily. 01/22/20   Gwenlyn Fudge, FNP  saccharomyces boulardii (FLORASTOR) 250 MG capsule Take 1 capsule (250 mg total) by mouth 2 (two) times daily. 01/02/20   Gwenlyn Fudge, FNP  topiramate (TOPAMAX) 50 MG tablet Take 50 mg by mouth 2 (two) times daily. 05/25/20   [provider]  traZODone (DESYREL) 50 MG tablet Take 50 mg by mouth at bedtime. 06/17/20   [provider]   Physical Exam: Vitals:   12/30/20 1800 12/30/20 1900 12/30/20 1930 12/30/20 2052  BP: (!) 139/119 (!) 136/101 (!) 157/90 (!) 146/69  Pulse:  (!) 48 (!) 56 (!) 59  Resp: (!) 23 16 18  20  Temp:    (!) 97.5 F (36.4 C)  TempSrc:    Oral  SpO2:  99% 94%   Weight:      Height:       Constitutional: Looks chronically ill, but currently in NAD Eyes: PERRL, lids and conjunctivae normal ENMT: Mucous membranes are mildly dry. Posterior pharynx clear of any exudate or lesions. Neck: normal, supple, no masses, no thyromegaly Respiratory: Clear to auscultation bilaterally, no wheezing, no crackles. Normal respiratory effort. No accessory muscle use.  Cardiovascular: Bradycardic at 48 bpm, no murmurs / rubs / gallops. No extremity edema. 2+ pedal pulses. No carotid bruits.  Abdomen: Obese, no distention.  Bowel sounds positive.  Soft, no tenderness, no masses palpated. No hepatosplenomegaly. Musculoskeletal: Mild generalized weakness.  No clubbing / cyanosis. Good ROM, no contractures. Normal muscle tone.  Skin: Some areas of hyperpigmentation of lower extremities. Neurologic: CN 2-12 grossly intact. Sensation intact, DTR normal.  Right-sided hemiparesis. Psychiatric: Awake, alert, oriented to self only.  Sometimes answers simple questions..   Labs on Admission: I have personally reviewed following labs and imaging studies  CBC: Recent Labs  Lab  12/30/20 1622  WBC 5.2  NEUTROABS 3.9  HGB 12.5  HCT 39.2  MCV 101.0*  PLT 216   Basic Metabolic Panel: Recent Labs  Lab 12/30/20 1622  NA 143  K 4.1  CL 119*  CO2 17*  GLUCOSE 136*  BUN 15  CREATININE 1.00  CALCIUM 9.4   GFR: Estimated Creatinine Clearance: 66.6 mL/min (by C-G formula based on SCr of 1 mg/dL).  Liver Function Tests: Recent Labs  Lab 12/30/20 1622  AST 23  ALT 16  ALKPHOS 70  BILITOT 0.5  PROT 6.6  ALBUMIN 4.0   Urine analysis:    Component Value Date/Time   COLORURINE YELLOW 12/30/2020 1621   APPEARANCEUR CLEAR 12/30/2020 1621   LABSPEC 1.020 12/30/2020 1621   PHURINE 6.0 12/30/2020 1621   GLUCOSEU NEGATIVE 12/30/2020 1621   HGBUR NEGATIVE 12/30/2020 1621   BILIRUBINUR NEGATIVE 12/30/2020 1621   KETONESUR 5 (A) 12/30/2020 1621   PROTEINUR NEGATIVE 12/30/2020 1621   NITRITE NEGATIVE 12/30/2020 1621   LEUKOCYTESUR NEGATIVE 12/30/2020 1621   Radiological Exams on Admission: DG Chest 2 View  Result Date: 12/30/2020 CLINICAL DATA:  Altered mental status. EXAM: CHEST - 2 VIEW COMPARISON:  Chest x-ray dated October 10, 2020. FINDINGS: The heart size and mediastinal contours are within normal limits. Normal pulmonary vascularity. No focal consolidation, pleural effusion, or pneumothorax. No acute osseous abnormality. Chronic T11 compression fracture. IMPRESSION: 1. No acute cardiopulmonary disease. Electronically Signed   By: Obie Dredge M.D.   On: 12/30/2020 17:14   CT HEAD WO CONTRAST  Result Date: 12/30/2020 CLINICAL DATA:  Delirium. Additional history provided: Altered mental status today. EXAM: CT HEAD WITHOUT CONTRAST TECHNIQUE: Contiguous axial images were obtained from the base of the skull through the vertex without intravenous contrast. COMPARISON:  MRI/MRA head 10/11/2020.  Head CT 10/10/2020. FINDINGS: Brain: Mildly motion degraded exam. Mild cerebral atrophy. Large chronic cortical/subcortical left PCA territory infarct within the  left parietal, occipital and temporal lobes. Ex vacuo dilatation of the left lateral ventricle atrium. Moderate patchy and ill-defined hypoattenuation within the cerebral white matter is nonspecific, but compatible with chronic small vessel ischemic disease. There is no acute intracranial hemorrhage. No acute demarcated cortical infarct. No extra-axial fluid collection. No evidence of intracranial mass. No midline shift. Vascular: No hyperdense vessel.  Atherosclerotic calcifications. Skull: Normal. Negative for fracture or focal  lesion. Sinuses/Orbits: Visualized orbits show no acute finding. No significant paranasal sinus disease at the imaged levels. IMPRESSION: No evidence of acute intracranial abnormality. Redemonstrated large chronic cortical/subcortical left PCA territory infarct. Stable background mild generalized parenchymal atrophy and moderate chronic small vessel ischemic disease. Electronically Signed   By: Jackey Loge DO   On: 12/30/2020 16:59   EKG: Independently reviewed. Vent. rate 69 BPM PR interval * ms QRS duration 95 ms QT/QTc 455/488 ms P-R-T axes 46 23 28 Sinus rhythm Low voltage, precordial leads Borderline prolonged QT interval  Assessment/Plan Principal Problem:   Acute metabolic encephalopathy Unknown etiology at this time. Place in observation/telemetry. Frequent neurochecks. Reorient as needed. Continue time-limited IV hydration. Hold psychotropic medications. Consider further work-up if no improvement.  Active Problems:   Generalized anxiety disorder Currently encephalopathic. Holding buspirone, duloxetine, quetiapine and trazodone.    Gout Continue allopurinol 300 mg p.o. daily.    Essential (primary) hypertension Continue furosemide 20 mg p.o. daily. Monitor BP, renal function electrolytes.    Personal history of transient ischemic attack (TIA),    and cerebral infarction without residual deficits Continue supportive care. May need physical  therapy evaluation.    Paroxysmal atrial fibrillation (HCC) CHA?DS?-VASc Score of at least 7. Not on anticoagulation.    Sinus bradycardia Recurring issue. No specific etiology. Monitor heart rate closely. Atropine IV as needed if severe and sustained.    Seizure disorder as sequela of cerebrovascular accident (HCC) Continue Topamax 50 mg p.o. twice daily. Continue Lamictal 50 mg p.o. twice daily.    Macrocytosis Follow-up CBC. If still persistent, check B12 and folic acid level.   DVT prophylaxis: SCDs. Code Status:   Full code. Family Communication: Disposition Plan:   Patient is from:  Home.  Anticipated DC to:  Home.  Anticipated DC date:  01/01/2021.  Anticipated DC barriers: Clinical status.  Consults called: Admission status:  Observation/telemetry. Severity of Illness:  High severity due to acute metabolic encephalopathy with no obvious etiology.  The patient will need to remain in the hospital for 24 to 48 hours for volume repletion, close monitoring and further work-up.   Bobette Mo MD [facility]  How to contact the [facility] Attending or Consulting provider 7A - 7P or covering provider during after hours 7P -7A, for this patient?   1. Check the care team in [facility] and look for a) attending/consulting [facility] provider listed and b) the [facility] team listed 2. Log into [email or web address] and use [facility]'s universal password to access. If you do not have the password, please contact the hospital operator. 3. Locate the [facility] provider you are looking for under [facility] and page to a number that you can be directly reached. 4. If you still have difficulty reaching the provider, please page the [facility] (Director on Call) for the Hospitalists listed on amion for assistance.  12/30/2020, 10:54 PM   This document was prepared using Dragon voice recognition software and may contain some unintended transcription errors.

## 2020-12-30 NOTE — ED Provider Notes (Signed)
[facility] EMERGENCY DEPARTMENT Provider Note   CSN: [id number] Arrival date & time: 12/30/20  1539     History Chief Complaint  Patient presents with  . Altered Mental Status    LEVEL 5 CAVEAT 2/2 AMS  [name] is a 70 y.o. female.  70 year old female with a history of hypertension, PAF (not chronically anticoagulated), CVA, seizures, chronic pain, alcohol abuse, MDD presents to the emergency department by EMS for altered mental status.  Husband called EMS reporting that the patient was confused.  EMS arrived to find the patient naked in the bathroom turning in circles.  She was only oriented to self.  Patient presently is awake and alert.  She denies headache, chest pain, abdominal pain, back pain, shortness of breath.  Denies alcohol or drug use.  Unable to contribute much to history due to acute confusion.  On chart review, appears to have been admitted for similar presentation in December suspected to be related to a TIA and UTI.  The history is provided by the EMS personnel and the patient. No language interpreter was used.  Altered Mental Status      Past Medical History:  Diagnosis Date  . Alcohol abuse   . Anxiety   . Arthritis   . Chronic back pain    radiating to bilat legs/weakness  . Chronic knee pain   . Compression fracture of T11 vertebra ([place])   . Edema   . Gout   . Hemiplegia and hemiparesis following cerebral infarction affecting right dominant side ([place])   . Hemorrhagic stroke ([place])   . History of dental problems   . Hypertension   . Insomnia, unspecified   . Low back pain   . Major depressive disorder, single episode, moderate ([place])   . PAF (paroxysmal atrial fibrillation) ([place])   . Pain in joint involving shoulder region   . Panic disorder without agoraphobia   . Pelvic pain   . Seizures ([place])   . Spinal stenosis, lumbar   . Stroke [facility])    old left posterior parietal/temporal/occipital lobe infarct; TIA 5 - 2018 with aphasia, given tPA  .  Subarachnoid hemorrhage ([place])   . Unspecified sequelae of unspecified cerebrovascular disease     Patient Active Problem List   Diagnosis Date Noted  . Acute encephalopathy 12/30/2020  . TIA (transient ischemic attack) 10/10/2020  . Acute metabolic encephalopathy 35/00/9381  . Major depressive disorder, recurrent episode, moderate ([place]) 01/03/2020  . Unsteadiness on feet 11/21/2019  . Generalized anxiety disorder   . Edema, unspecified   . Gout   . Low back pain   . Essential (primary) hypertension   . 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 ([place])   . Insomnia, unspecified   . Panic disorder without agoraphobia   . Personal history of transient ischemic attack (TIA), and cerebral infarction without residual deficits   . Acute lower UTI   . Weakness   . Paroxysmal atrial fibrillation ([place]) 03/05/2017  . Stroke aborted by administration of thrombolytic agent ([place]) 03/05/2017  . Former smoker 03/03/2017  . Gastric bypass status for obesity 03/03/2017  . Seizure disorder as sequela of cerebrovascular accident ([place]) 03/03/2017  . Chronic bilateral thoracic back pain 06/16/2016  . Chronic diarrhea 06/16/2016  . Impaired mobility and ADLs 06/05/2016  . Neurocognitive deficits 06/05/2016  . History of stroke 06/05/2016  . Pelvic pain in female 11/19/2015  . Arthritis of knee 06/24/2013  . HYPERKALEMIA 07/07/2009  .  DIASTOLIC HEART FAILURE, CHRONIC 07/07/2009  . DYSPNEA 07/07/2009    Past Surgical History:  Procedure Laterality Date  . BARIATRIC SURGERY    . BIOPSY N/A 12/02/2015   Procedure: BIOPSY;  Surgeon: Rogene Houston, MD;  Location: [facility] ENDO SUITE;  Service: Endoscopy;  Laterality: N/A;  Random colon biopsies  . CHOLECYSTECTOMY    . COLONOSCOPY    . COLONOSCOPY N/A 12/02/2015   Procedure: COLONOSCOPY;  Surgeon: Rogene Houston, MD;  Location: [facility] ENDO SUITE;  Service: Endoscopy;  Laterality: N/A;   110  . EYE SURGERY     as a child  . GASTRIC BYPASS     in 1985 for obesity  . POLYPECTOMY N/A 12/02/2015   Procedure: POLYPECTOMY;  Surgeon: Rogene Houston, MD;  Location: [facility] ENDO SUITE;  Service: Endoscopy;  Laterality: N/A;  Splenic flexure polyp  . right foot surgery       OB History   No obstetric history on file.     Family History  Problem Relation Age of Onset  . Healthy Mother   . Hodgkin's lymphoma Father   . Arthritis Sister     Social History   Tobacco Use  . Smoking status: Former Smoker    Types: Cigarettes  . Smokeless tobacco: Never Used  . Tobacco comment: Patient has quit but not sure when   Vaping Use  . Vaping Use: Never used  Substance Use Topics  . Alcohol use: No    Alcohol/week: 0.0 standard drinks  . Drug use: No    Home Medications Prior to Admission medications   Medication Sig Start Date End Date Taking? Authorizing Provider  acetaminophen (TYLENOL) 325 MG tablet Take 2 tablets (650 mg total) by mouth every 6 (six) hours as needed for mild pain (or Fever >/= 101). 10/12/20   Roxan Hockey, MD  allopurinol (ZYLOPRIM) 300 MG tablet Take 300 mg by mouth daily. 08/29/18   [provider]  aspirin EC 81 MG tablet Take 1 tablet (81 mg total) by mouth daily with breakfast. 10/12/20 10/12/21  Roxan Hockey, MD  atorvastatin (LIPITOR) 40 MG tablet Take 1 tablet (40 mg total) by mouth at bedtime. 10/12/20   Roxan Hockey, MD  busPIRone (BUSPAR) 15 MG tablet Take 1 tablet (15 mg total) by mouth 2 (two) times daily. Need appointment for further refills. 04/15/20   Loman [place], FNP  calcium-vitamin D (OSCAL WITH D) 500-200 MG-UNIT tablet Take 1 tablet by mouth 2 (two) times daily.    [provider]  Cholecalciferol (VITAMIN D3) 25 MCG (1000 UT) CAPS Take by mouth.    [provider]  DULoxetine (CYMBALTA) 60 MG capsule Take 1 capsule (60 mg total) by mouth every morning. 01/22/20   Loman [place], FNP  furosemide  (LASIX) 20 MG tablet Take 1 tablet (20 mg total) by mouth daily. 10/12/20   Roxan Hockey, MD  lamoTRIgine (LAMICTAL) 25 MG tablet Take 50 mg by mouth 2 (two) times daily. 08/29/18   [provider]  Chrisman- vite tablet    [provider]  pantoprazole (PROTONIX) 40 MG tablet Take 1 tablet (40 mg total) by mouth 2 (two) times daily. (Needs to be seen before next refill) 10/04/20   Dettinger, Fransisca Kaufmann, MD  potassium chloride SA (KLOR-CON) 20 MEQ tablet Take 1 tablet (20 mEq total) by mouth daily. 01/22/20   Loman [place], FNP  QUEtiapine (SEROQUEL) 100 MG tablet Take 1 tablet (100 mg total) by  mouth daily. 01/22/20   Loman Brooklyn, FNP  saccharomyces boulardii (FLORASTOR) 250 MG capsule Take 1 capsule (250 mg total) by mouth 2 (two) times daily. 01/02/20   Loman Brooklyn, FNP  topiramate (TOPAMAX) 50 MG tablet Take 50 mg by mouth 2 (two) times daily. 05/25/20   [provider]  traZODone (DESYREL) 50 MG tablet Take 50 mg by mouth at bedtime. 06/17/20   [provider]    Allergies    Patient has no known allergies.  Review of Systems   Review of Systems  Unable to perform ROS: Mental status change    Physical Exam Updated Vital Signs BP (!) 157/90   Pulse (!) 56   Temp 97.8 F (36.6 C) (Rectal)   Resp 18   Ht 5\' 8"  (1.727 m)   Wt 99.8 kg   SpO2 94%   BMI 33.45 kg/m   Physical Exam Vitals and nursing note reviewed.  Constitutional:      General: She is not in acute distress.    Appearance: She is well-developed. She is not diaphoretic.  HENT:     Head: Normocephalic and atraumatic.     Mouth/Throat:     Comments: Absent dentition Eyes:     General: No scleral icterus.    Extraocular Movements: Extraocular movements intact.     Conjunctiva/sclera: Conjunctivae normal.     Comments: Mild b/l exophthalmos. No nystagmus appreciated. PERRL.  Cardiovascular:     Rate and Rhythm: Normal rate and regular rhythm.      Pulses: Normal pulses.  Pulmonary:     Effort: Pulmonary effort is normal. No respiratory distress.     Comments: Lungs CTAB. Respirations even and unlabored. Abdominal:     Comments: Soft, obese, nondistended abdomen.  Musculoskeletal:        General: Normal range of motion.     Cervical back: Normal range of motion.  Skin:    General: Skin is warm and dry.     Coloration: Skin is not pale.     Findings: No erythema or rash.  Neurological:     Mental Status: She is alert and oriented to person, place, and time.     GCS: GCS eye subscore is 4. GCS verbal subscore is 4. GCS motor subscore is 6.     Comments: Alert to self. Can identify hospital, but not city, year, president. Unable to identify common objects (phone) or verbalize their use. Will follow some commands. No cranial nerve deficits appreciated; symmetric eyebrow raise, no facial drooping, tongue midline. Some strength deficits to the RUE, but otherwise moving extremities spontaneously. Ambulation not assessed.  Psychiatric:        Behavior: Behavior normal.     ED Results / Procedures / Treatments   Labs (all labs ordered are listed, but only abnormal results are displayed) Labs Reviewed  COMPREHENSIVE METABOLIC PANEL - Abnormal; Notable for the following components:      Result Value   Chloride 119 (*)    CO2 17 (*)    Glucose, Bld 136 (*)    All other components within normal limits  CBC WITH DIFFERENTIAL/PLATELET - Abnormal; Notable for the following components:   MCV 101.0 (*)    All other components within normal limits  URINALYSIS, ROUTINE W REFLEX MICROSCOPIC - Abnormal; Notable for the following components:   Ketones, ur 5 (*)    All other components within normal limits  URINE CULTURE  SARS CORONAVIRUS 2 (TAT 6-24 HRS)  RAPID URINE DRUG  SCREEN, HOSP PERFORMED  ETHANOL  AMMONIA  BASIC METABOLIC PANEL  CBG MONITORING, ED    EKG EKG Interpretation  Date/Time:  Thursday December 30 2020 15:57:46  EDT Ventricular Rate:  69 PR Interval:    QRS Duration: 95 QT Interval:  455 QTC Calculation: 488 R Axis:   23 Text Interpretation: Sinus rhythm Low voltage, precordial leads Borderline prolonged QT interval since last tracing no significant change Confirmed by Noemi Chapel [id number]) on 12/30/2020 5:18:31 PM   Radiology DG Chest 2 View  Result Date: 12/30/2020 CLINICAL DATA:  Altered mental status. EXAM: CHEST - 2 VIEW COMPARISON:  Chest x-ray dated October 10, 2020. FINDINGS: The heart size and mediastinal contours are within normal limits. Normal pulmonary vascularity. No focal consolidation, pleural effusion, or pneumothorax. No acute osseous abnormality. Chronic T11 compression fracture. IMPRESSION: 1. No acute cardiopulmonary disease. Electronically Signed   By: Titus Dubin M.D.   On: 12/30/2020 17:14   CT HEAD WO CONTRAST  Result Date: 12/30/2020 CLINICAL DATA:  Delirium. Additional history provided: Altered mental status today. EXAM: CT HEAD WITHOUT CONTRAST TECHNIQUE: Contiguous axial images were obtained from the base of the skull through the vertex without intravenous contrast. COMPARISON:  MRI/MRA head 10/11/2020.  Head CT 10/10/2020. FINDINGS: Brain: Mildly motion degraded exam. Mild cerebral atrophy. Large chronic cortical/subcortical left PCA territory infarct within the left parietal, occipital and temporal lobes. Ex vacuo dilatation of the left lateral ventricle atrium. Moderate patchy and ill-defined hypoattenuation within the cerebral white matter is nonspecific, but compatible with chronic small vessel ischemic disease. There is no acute intracranial hemorrhage. No acute demarcated cortical infarct. No extra-axial fluid collection. No evidence of intracranial mass. No midline shift. Vascular: No hyperdense vessel.  Atherosclerotic calcifications. Skull: Normal. Negative for fracture or focal lesion. Sinuses/Orbits: Visualized orbits show no acute finding. No significant  paranasal sinus disease at the imaged levels. IMPRESSION: No evidence of acute intracranial abnormality. Redemonstrated large chronic cortical/subcortical left PCA territory infarct. Stable background mild generalized parenchymal atrophy and moderate chronic small vessel ischemic disease. Electronically Signed   By: Kellie Simmering DO   On: 12/30/2020 16:59    Procedures Procedures   Medications Ordered in ED Medications - No data to display  ED Course  I have reviewed the triage vital signs and the nursing notes.  Pertinent labs & imaging results that were available during my care of the patient were reviewed by me and considered in my medical decision making (see chart for details).  Clinical Course as of 12/30/20 2007  Thu Dec 30, 2020  1625 No indication for code stroke activation; no focal deficits appreciated on bedside assessment. [KH]  6269 Chronic metabolic acidosis; normal anion gap. UA without concern for UTI. [KH]  1938 Case discussed with Dr. Olevia Bowens of [facility] who will assess for admission. [KH]    Clinical Course User Index [KH] Antonietta Breach, PA-C   MDM Rules/Calculators/A&P                          70 year old female to be admitted for further evaluation of acute encephalopathy. Was found in the bathroom naked, walking in circles.  Able to follow very little commands.  Has some mild right upper extremity strength deficits which are felt to be residual from prior CVA.  No other focal changes noted.  Her head CT is stable and labs are reassuring.  Urinalysis negative for UTI.  UDS and ethanol negative as well.  Will require further  inpatient observation.  Case discussed with Dr. Olevia Bowens of [facility] who will admit.   Final Clinical Impression(s) / ED Diagnoses Final diagnoses:  Acute encephalopathy    Rx / DC Orders ED Discharge Orders    None       Beverely Pace 12/30/20 2008    Noemi Chapel, MD 01/02/21 1001

## 2020-12-30 NOTE — ED Triage Notes (Signed)
Pt comes via EMS from home. Her husband called 911 because the pt was very confused. EMS arrived and the pt was naked in the bathroom turning circles and only oriented to her self. VSS. Pt only orient to self here in the ED.

## 2020-12-30 NOTE — ED Provider Notes (Signed)
Medical screening examination/treatment/procedure(s) were conducted as a shared visit with non-physician practitioner(s) and myself.  I personally evaluated the patient during the encounter.  Clinical Impression:   Final diagnoses:  Acute encephalopathy    Patient presents with altered mental status, it is not exactly clear when the patient became confused but the husband called 911 because of the confusion today.  The patient was found in the bathroom walking in circles naked, on my exam the patient is not able to essentially follow commands, she is grabbing all of the cardiac leads and pulling them off of her body and swinging them around her head.  She has no complaints, when I asked her where she is she mumbles something, it sounds like words but makes no sense, she is moving all 4 extremities, labs CT scan EKG all unremarkable.  Patient will need to be admitted to the hospital for altered mental status of unclear etiology   EKG Interpretation  Date/Time:  Thursday December 30 2020 15:57:46 EDT Ventricular Rate:  69 PR Interval:    QRS Duration: 95 QT Interval:  455 QTC Calculation: 488 R Axis:   23 Text Interpretation: Sinus rhythm Low voltage, precordial leads Borderline prolonged QT interval since last tracing no significant change Confirmed by Noemi Chapel [phone]) on 12/30/2020 5:18:31 PM          Noemi Chapel, MD 01/02/21 1001

## 2020-12-31 DIAGNOSIS — G40909 Epilepsy, unspecified, not intractable, without status epilepticus: Secondary | ICD-10-CM

## 2020-12-31 DIAGNOSIS — I69398 Other sequelae of cerebral infarction: Secondary | ICD-10-CM

## 2020-12-31 DIAGNOSIS — I480 Paroxysmal atrial fibrillation: Secondary | ICD-10-CM

## 2020-12-31 DIAGNOSIS — Z8673 Personal history of transient ischemic attack (TIA), and cerebral infarction without residual deficits: Secondary | ICD-10-CM | POA: Diagnosis not present

## 2020-12-31 DIAGNOSIS — F411 Generalized anxiety disorder: Secondary | ICD-10-CM

## 2020-12-31 DIAGNOSIS — G9341 Metabolic encephalopathy: Secondary | ICD-10-CM | POA: Diagnosis not present

## 2020-12-31 DIAGNOSIS — M1A9XX0 Chronic gout, unspecified, without tophus (tophi): Secondary | ICD-10-CM | POA: Diagnosis not present

## 2020-12-31 DIAGNOSIS — I10 Essential (primary) hypertension: Secondary | ICD-10-CM

## 2020-12-31 LAB — BASIC METABOLIC PANEL
Anion gap: 10 (ref 5–15)
BUN: 15 mg/dL (ref 8–23)
CO2: 17 mmol/L — ABNORMAL LOW (ref 22–32)
Calcium: 8.9 mg/dL (ref 8.9–10.3)
Chloride: 116 mmol/L — ABNORMAL HIGH (ref 98–111)
Creatinine, Ser: 0.73 mg/dL (ref 0.44–1.00)
GFR, Estimated: >60 mL/min (ref >60)
Glucose, Bld: 115 mg/dL — ABNORMAL HIGH (ref 70–99)
Potassium: 2.9 mmol/L — ABNORMAL LOW (ref 3.5–5.1)
Sodium: 143 mmol/L (ref 135–145)

## 2020-12-31 LAB — SARS CORONAVIRUS 2 (TAT 6-24 HRS): SARS Coronavirus 2: NEGATIVE

## 2020-12-31 MED ORDER — ATROPINE SULFATE 1 MG/10ML IJ SOSY: 0.5000 mg | PREFILLED_SYRINGE | INTRAMUSCULAR | Status: DC | PRN

## 2020-12-31 MED ORDER — POTASSIUM CHLORIDE CRYS ER 20 MEQ PO TBCR
40.0000 meq | EXTENDED_RELEASE_TABLET | Freq: Four times a day (QID) | ORAL | Status: AC
  Administered 2020-12-31 (×3): 40 meq via ORAL
  Filled 2020-12-31 (×3): qty 2

## 2020-12-31 MED ORDER — MAGNESIUM SULFATE IN D5W 1-5 GM/100ML-% IV SOLN
1.0000 g | Freq: Once | INTRAVENOUS | Status: AC
  Administered 2020-12-31: 1 g via INTRAVENOUS
  Filled 2020-12-31: qty 100

## 2020-12-31 NOTE — Care Management Obs Status (Signed)
[facility] NOTIFICATION   Patient Details  Name: [name] MRN: [id number] Date of Birth: 1951-06-09   Medicare Observation Status Notification Given:  Yes    Tommy Medal 12/31/2020, 3:37 PM

## 2020-12-31 NOTE — Progress Notes (Addendum)
PROGRESS NOTE    [name]  ZOX:[id number] DOB: [birth year] DOA: 12/30/2020 PCP: Gwenlyn Fudge, FNP   Chief Complaint  Patient presents with  . Altered Mental Status    Brief admission Narrative:  As per H&P written by Dr. Robb Matar 12/30/20 [name] is a 70 y.o. female with medical history significant of alcohol abuse, anxiety, osteoarthritis, chronic back pain, chronic knee pain, chronic T11 compression fracture, spinal stenosis, gout, right hemiparesis following unspecified CVA, history of hemorrhagic stroke/SAH, post CVA seizures, hypertension, major depressive disorder, panic disorder with agoraphobia, paroxysmal atrial fibrillation not on anticoagulation who is brought to the emergency department by EMS after her husband noticed that she has been very confused.  EMS arrived to the scene and found the patient naked in the bathroom turning circles and only oriented to herself.  She is unable to provide further history.  She had a similar presentation in December of last year, which was secondary to a transient ischemic attack and a urinary tract infection.  ED Course: Initial vital signs were temperature 97.8 F, pulse 71, respiration 20, BP 119/78 mmHg and O2 sat 97% on room air.   Assessment & Plan: 1-acute metabolic encephalopathy -In the setting of polypharmacy most likely -No signs of acute infection or acute abnormalities appreciated on her head images study. -Will check B12: Vitamin D and TSH -Continue to replete electrolytes and maintain adequate hydration -PT evaluation requested. -Continue minimizing psychotropic agents.  2-Generalized anxiety disorder -Stable mood currently. -Continue Cymbalta.  3-Gout -Stable -No acute flare appreciated at this time. -Continue allopurinol  4-Essential (primary) hypertension -Well-controlled -Continue to follow vital signs -Continue heart healthy diet.  5-Personal history of transient ischemic attack (TIA), and cerebral  infarction without residual deficits -Chronic changes appreciated mentations CT scan -No acute abnormality seen -Continue with statin medication and aspirin for secondary prevention.  6-hyperlipidemia -Continue Lipitor  7-paroxysmal fibrillation -Rate control on sinus rhythm: -Chronically no using any agents to control rate -Continue as needed. -Follow a candidate for anticoagulation due to frequent falls.  8-gastroesophageal reflux disease -Continue PPI  9-hypokalemia -Follow electrolytes and replete as needed -Continue telemetry monitoring and check magnesium level.  10-seizure disorder: -Continue Lamictal -No seizure activity appreciated.  DVT prophylaxis: Lovenox Code Status: full code Family Communication: no family at bedside; unable to reach husband over the phone. Disposition:   Status is: Observation  Dispo: The patient is from: home              Anticipated d/c is to: Home, with home health services.              Patient currently is medically stable for discharge.  Blood work demonstrating hypokalemia and mild metabolic acidosis.  Despite improvement in mentation is still not back to baseline.  Completing work-up.  B12, TSH and vitamin D pending.  Physical therapy evaluation requested.   Difficult to place patient no      Consultants:   None   Procedures:  See below for x-ray reports.   Antimicrobials: None   Subjective: No fever, no chest pain, no nausea, no vomiting, no acute distress.  Oriented x2, following commands appropriately.  Objective: Vitals:   12/31/20 0213 12/31/20 0636 12/31/20 0855 12/31/20 1255  BP: (!) 144/64 131/79 115/79 131/82  Pulse: (!) 56 (!) 54 (!) 57 64  Resp: 18 18 17    Temp: 98.2 F (36.8 C) (!) 97.2 F (36.2 C) 97.9 F (36.6 C) 97.9 F (36.6 C)  TempSrc: Oral  Oral Oral  SpO2:  100% 100% 100%  Weight:      Height:        Intake/Output Summary (Last 24 hours) at 12/31/2020 1703 Last data filed at 12/31/2020  1300 Gross per 24 hour  Intake 180.8 ml  Output 0 ml  Net 180.8 ml   Filed Weights   12/30/20 1551  Weight: 99.8 kg    Examination:  General exam: Afebrile, oriented x2; no chest pain, no nausea, no vomiting.  Following commands and answering questions appropriately.  Hemodynamically stable. Respiratory system: Clear to auscultation. Respiratory effort normal.  No using accessory muscle. Cardiovascular system: S1 & S2 heard, regular rate and sinus rhythm.  No JVD, no rubs, no gallops. Gastrointestinal system: Abdomen is nondistended, soft and nontender. No organomegaly or masses felt. Normal bowel sounds heard. Central nervous system: Alert and oriented. No focal neurological deficits. Extremities: No cyanosis or clubbing; normal extremity edema appreciated. Skin: No petechiae. Psychiatry: Mood & affect appropriate.     Data Reviewed: I have personally reviewed following labs and imaging studies  CBC: Recent Labs  Lab 12/30/20 1622  WBC 5.2  NEUTROABS 3.9  HGB 12.5  HCT 39.2  MCV 101.0*  PLT 216    Basic Metabolic Panel: Recent Labs  Lab 12/30/20 1622 12/31/20 0549  NA 143 143  K 4.1 2.9*  CL 119* 116*  CO2 17* 17*  GLUCOSE 136* 115*  BUN 15 15  CREATININE 1.00 0.73  CALCIUM 9.4 8.9    GFR: Estimated Creatinine Clearance: 83.2 mL/min (by C-G formula based on SCr of 0.73 mg/dL).  Liver Function Tests: Recent Labs  Lab 12/30/20 1622  AST 23  ALT 16  ALKPHOS 70  BILITOT 0.5  PROT 6.6  ALBUMIN 4.0    CBG: No results for input(s): GLUCAP in the last 168 hours.   Recent Results (from the past 240 hour(s))  SARS CORONAVIRUS 2 (TAT 6-24 HRS) Nasopharyngeal Nasopharyngeal Swab     Status: None   Collection Time: 12/30/20  7:21 PM   Specimen: Nasopharyngeal Swab  Result Value Ref Range Status   SARS Coronavirus 2 NEGATIVE NEGATIVE Final    Comment: (NOTE) SARS-CoV-2 target nucleic acids are NOT DETECTED.  The SARS-CoV-2 RNA is generally  detectable in upper and lower respiratory specimens during the acute phase of infection. Negative results do not preclude SARS-CoV-2 infection, do not rule out co-infections with other pathogens, and should not be used as the sole basis for treatment or other patient management decisions. Negative results must be combined with clinical observations, patient history, and epidemiological information. The expected result is Negative.  Fact Sheet for Patients: HairSlick.no  Fact Sheet for Healthcare Providers: quierodirigir.com  This test is not yet approved or cleared by the [place] FDA and  has been authorized for detection and/or diagnosis of SARS-CoV-2 by FDA under an Emergency Use Authorization (EUA). This EUA will remain  in effect (meaning this test can be used) for the duration of the COVID-19 declaration under Se ction 564(b)(1) of the Act, 21 U.S.C. section 360bbb-3(b)(1), unless the authorization is terminated or revoked sooner.  Performed at [facility], [address] [place], [place] [address]      Radiology Studies: DG Chest 2 View  Result Date: 12/30/2020 CLINICAL DATA:  Altered mental status. EXAM: CHEST - 2 VIEW COMPARISON:  Chest x-ray dated October 10, 2020. FINDINGS: The heart size and mediastinal contours are within normal limits. Normal pulmonary vascularity. No focal consolidation, pleural effusion,  or pneumothorax. No acute osseous abnormality. Chronic T11 compression fracture. IMPRESSION: 1. No acute cardiopulmonary disease. Electronically Signed   By: Obie Dredge M.D.   On: 12/30/2020 17:14   CT HEAD WO CONTRAST  Result Date: 12/30/2020 CLINICAL DATA:  Delirium. Additional history provided: Altered mental status today. EXAM: CT HEAD WITHOUT CONTRAST TECHNIQUE: Contiguous axial images were obtained from the base of the skull through the vertex without intravenous contrast. COMPARISON:   MRI/MRA head 10/11/2020.  Head CT 10/10/2020. FINDINGS: Brain: Mildly motion degraded exam. Mild cerebral atrophy. Large chronic cortical/subcortical left PCA territory infarct within the left parietal, occipital and temporal lobes. Ex vacuo dilatation of the left lateral ventricle atrium. Moderate patchy and ill-defined hypoattenuation within the cerebral white matter is nonspecific, but compatible with chronic small vessel ischemic disease. There is no acute intracranial hemorrhage. No acute demarcated cortical infarct. No extra-axial fluid collection. No evidence of intracranial mass. No midline shift. Vascular: No hyperdense vessel.  Atherosclerotic calcifications. Skull: Normal. Negative for fracture or focal lesion. Sinuses/Orbits: Visualized orbits show no acute finding. No significant paranasal sinus disease at the imaged levels. IMPRESSION: No evidence of acute intracranial abnormality. Redemonstrated large chronic cortical/subcortical left PCA territory infarct. Stable background mild generalized parenchymal atrophy and moderate chronic small vessel ischemic disease. Electronically Signed   By: Jackey Loge DO   On: 12/30/2020 16:59    Scheduled Meds: . allopurinol  300 mg Oral Daily  . aspirin EC  81 mg Oral Q breakfast  . atorvastatin  40 mg Oral QHS  . DULoxetine  60 mg Oral q morning  . enoxaparin (LOVENOX) injection  40 mg Subcutaneous Q24H  . furosemide  20 mg Oral Daily  . lamoTRIgine  50 mg Oral BID  . pantoprazole  40 mg Oral BID  . potassium chloride SA  40 mEq Oral Q6H  . topiramate  50 mg Oral BID   Continuous Infusions:   LOS: 0 days    Time spent: 30 minutes.    Vassie Loll, MD [facility]   To contact the attending provider between 7A-7P or the covering provider during after hours 7P-7A, please log into the web site [email or web address] and access using universal [place] password for that web site. If you do not have the password, please call the hospital  operator.  12/31/2020, 5:03 PM

## 2021-01-01 DIAGNOSIS — I480 Paroxysmal atrial fibrillation: Secondary | ICD-10-CM | POA: Diagnosis not present

## 2021-01-01 DIAGNOSIS — G9341 Metabolic encephalopathy: Secondary | ICD-10-CM | POA: Diagnosis not present

## 2021-01-01 DIAGNOSIS — I69398 Other sequelae of cerebral infarction: Secondary | ICD-10-CM | POA: Diagnosis not present

## 2021-01-01 DIAGNOSIS — M1A9XX0 Chronic gout, unspecified, without tophus (tophi): Secondary | ICD-10-CM | POA: Diagnosis not present

## 2021-01-01 DIAGNOSIS — Z8673 Personal history of transient ischemic attack (TIA), and cerebral infarction without residual deficits: Secondary | ICD-10-CM | POA: Diagnosis not present

## 2021-01-01 DIAGNOSIS — I10 Essential (primary) hypertension: Secondary | ICD-10-CM | POA: Diagnosis not present

## 2021-01-01 LAB — BASIC METABOLIC PANEL
Anion gap: 7 (ref 5–15)
BUN: 14 mg/dL (ref 8–23)
CO2: 19 mmol/L — ABNORMAL LOW (ref 22–32)
Calcium: 9.2 mg/dL (ref 8.9–10.3)
Chloride: 115 mmol/L — ABNORMAL HIGH (ref 98–111)
Creatinine, Ser: 0.82 mg/dL (ref 0.44–1.00)
GFR, Estimated: >60 mL/min (ref >60)
Glucose, Bld: 101 mg/dL — ABNORMAL HIGH (ref 70–99)
Potassium: 4.4 mmol/L (ref 3.5–5.1)
Sodium: 141 mmol/L (ref 135–145)

## 2021-01-01 LAB — VITAMIN D 25 HYDROXY (VIT D DEFICIENCY, FRACTURES): Vit D, 25-Hydroxy: 47.47 ng/mL (ref 30–100)

## 2021-01-01 LAB — MAGNESIUM: Magnesium: 2.2 mg/dL (ref 1.7–2.4)

## 2021-01-01 LAB — URINE CULTURE: Culture: <10000 — AB

## 2021-01-01 LAB — TSH: TSH: 2.396 u[IU]/mL (ref 0.350–4.500)

## 2021-01-01 LAB — VITAMIN B12: Vitamin B-12: 308 pg/mL (ref 180–914)

## 2021-01-01 MED ORDER — CLOPIDOGREL BISULFATE 75 MG PO TABS: 75.0000 mg | ORAL_TABLET | Freq: Every day | ORAL | 5 refills | Status: AC

## 2021-01-01 MED ORDER — VITAMIN B-12 1000 MCG PO TABS
1000.0000 ug | ORAL_TABLET | Freq: Every day | ORAL | Status: DC
  Administered 2021-01-01: 1000 ug via ORAL
  Filled 2021-01-01: qty 1

## 2021-01-01 MED ORDER — CYANOCOBALAMIN 1000 MCG PO TABS: 1000.0000 ug | ORAL_TABLET | Freq: Every day | ORAL | 3 refills | Status: AC

## 2021-01-01 NOTE — Discharge Summary (Signed)
Physician Discharge Summary  [name] WUJ:[id number] DOB: 1951-02-22 DOA: 12/30/2020  PCP: Gwenlyn Fudge, FNP  Admit date: 12/30/2020 Discharge date: 01/01/2021  Time spent: 30 minutes  Recommendations for Outpatient Follow-up:  1. Repeat basic metabolic panel to follow lites and renal function 2. Reassess blood pressure and adjust antihypertensive regimen as needed 3. Make sure patient has follow-up with neurology and arrange visit with psychiatry to further adjust psychotropic medications.   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  Discharged home with instruction to follow-up with PCP and neurology as an outpatient.  CODE STATUS: Full code.  Diet recommendation: Heart healthy diet  Filed Weights   12/30/20 1551  Weight: 99.8 kg    History of present illness:  As per H&P written by Dr. Robb Matar 12/30/20 Tanya Nones [place] a 70 y.o.femalewith medical history significant ofalcohol abuse, anxiety, osteoarthritis, chronic back pain, chronic knee pain, chronic T11 compression fracture, spinal stenosis, gout, right hemiparesis following unspecified CVA, history of hemorrhagic stroke/SAH, post CVA seizures, hypertension, major depressive disorder, panic disorder with agoraphobia, paroxysmal atrial fibrillation not on anticoagulation who is brought to the emergency department by EMS after her husbandnoticed that she has been very confused. EMS arrived to the scene and found the patient naked in the bathroom turning circles and  only oriented to herself. She is unable to provide further history. She had a similar presentation in December of last year, which was secondary to a transient ischemic attack and a urinary tract infection.  ED Course:Initial vital signs were temperature97.8 F, pulse 71, respiration 20, BP 119/78 mmHg and O2 sat 97% on room air.  Hospital Course:  1-acute metabolic encephalopathy -In the setting of polypharmacy most likely -No signs of acute infection or acute abnormalities appreciated on her head images study. -As mentioned below in order to minimize the chances for TIAs aspirin has been substituted by Plavix. -TSH within normal limits -Vitamin D level pending at time of discharge. -B12 borderline normal and repletion/maintenance has been started. -Patient advised to maintain adequate nutrition and hydration. -Continue minimizing psychotropic agents. -Outpatient follow-up with neurology service as an outpatient.  2-Generalized anxiety disorder/depression -Stable mood currently. -Continue Cymbalta and nightly trazodone -BuSpar and Seroquel has been discontinued) to minimize psychotropic agents. -Outpatient follow-up with psychiatry recommended.  3-Gout -Stable -No acute flare appreciated at this time. -Continue allopurinol  4-Essential (primary) hypertension -Stable and well-controlled -Continue to follow vital signs and further adjusting antihypertensive regimen as needed. -Continue heart healthy diet.  5-Personal history of transient ischemic attack (TIA), and cerebral infarction without residual deficits -Chronic changes appreciated on CT scan -No acute abnormality seen -Continue with statin medication and the use of Plavix now, for secondary prevention. -Outpatient follow-up with neurology in 4-6 weeks.  6-hyperlipidemia -Continue Lipitor -Heart healthy diet encouraged.  7-paroxysmal fibrillation -Rate controlled and sinus rhythm appreciated throughout  hospitalization. -Chronically no using any agents to control rate -Patient is not a candidate for anticoagulation due to frequent falls. -Continue Plavix.  8-gastroesophageal reflux disease -Continue PPI  9-hypokalemia -Repleted -Resume daily maintenance dosage -Repeat basic metabolic panel  follow-up visit to reassess electrolytes stability.  10-seizure disorder: -Continue Lamictal -No seizure activity appreciated.  Procedures: See below for x-ray reports  Consultations:  Neurology curbside (outpatient follow-up recommended; no acute signs of a stroke during this admission after completing work-up.  No seizure.  Aspirin substituted for Plavix in an attempt to better protect her for TIAs).  Discharge Exam: Vitals:   01/01/21 0122 01/01/21 0533  BP: 119/64 107/77  Pulse: (!) 51 67  Resp: 18   Temp: 99 F (37.2 C) 97.8 F (36.6 C)  SpO2: 100% 99%    General: Afebrile, no chest pain, no nausea, no vomiting.  Tolerating diet without problems.  Following commands appropriately and denying any new focal neurologic deficits at this time. Cardiovascular: RRR, no rubs, no gallops, no JVD. Respiratory: Clear to auscultation bilaterally, no using accessory muscle.  Good saturation on room air. Abdomen: Soft, nontender, nondistended, positive bowel sounds Extremities: No cyanosis or clubbing.  Discharge Instructions   Discharge Instructions    Diet - low sodium heart healthy   Complete by: As directed    Discharge instructions   Complete by: As directed    Take medications as prescribed Maintain adequate hydration Follow-up with neurology in the next 4-6 weeks Arrange follow-up with PCP in 10 days Follow heart healthy diet.     Allergies as of 01/01/2021   No Known Allergies     Medication List    STOP taking these medications   aspirin EC 81 MG tablet   busPIRone 15 MG tablet Commonly known as: BUSPAR   QUEtiapine 100 MG tablet Commonly known as: SEROQUEL      TAKE these medications   acetaminophen 325 MG tablet Commonly known as: TYLENOL Take 2 tablets (650 mg total) by mouth every 6 (six) hours as needed for mild pain (or Fever >/= 101).   allopurinol 300 MG tablet Commonly known as: ZYLOPRIM Take 300 mg by mouth daily.   atorvastatin 40 MG tablet Commonly known as: LIPITOR Take 1 tablet (40 mg total) by mouth at bedtime.   calcium-vitamin D 500-200 MG-UNIT tablet Commonly known as: OSCAL WITH D Take 1 tablet by mouth 2 (two) times daily.   clopidogrel 75 MG tablet Commonly known as: Plavix Take 1 tablet (75 mg total) by mouth daily.   cyanocobalamin 1000 MCG tablet Take 1 tablet (1,000 mcg total) by mouth daily. Start taking on: January 02, 2021   DULoxetine 60 MG capsule Commonly known as: CYMBALTA Take 1 capsule (60 mg total) by mouth every morning.   furosemide 20 MG tablet Commonly known as: LASIX Take 1 tablet (20 mg total) by mouth daily.   lamoTRIgine 25 MG tablet Commonly known as: LAMICTAL Take 50 mg by mouth 2 (two) times daily.   OVER THE COUNTER MEDICATION Daly- vite tablet   pantoprazole 40 MG tablet Commonly known as: PROTONIX Take 1 tablet (40 mg total) by mouth 2 (two) times daily. (Needs to be seen before next refill)   potassium chloride SA 20 MEQ tablet Commonly known as: KLOR-CON Take 1 tablet (20 mEq total) by mouth daily.   saccharomyces boulardii 250 MG capsule Commonly known as: Florastor Take 1 capsule (250 mg total) by mouth 2 (two) times daily.   topiramate 50 MG tablet Commonly known as: TOPAMAX Take 50 mg by mouth 2 (two) times daily.   traZODone 50 MG tablet Commonly known as: DESYREL Take 50 mg by mouth at bedtime.   Vitamin D3 25 MCG (1000 UT) Caps Take  by mouth.      No Known Allergies  Follow-up Information    Gwenlyn Fudge, FNP. Schedule an appointment as soon as possible for a visit in 10 day(s).   Specialty: Family Medicine Contact information: [address] [place] [address] [phone]        Beryle Beams, MD. Schedule an appointment as soon as possible for a visit in 4 week(s).   Specialty: Neurology Contact information: [address] [place] [address] [phone]                The results of significant diagnostics from this hospitalization (including imaging, microbiology, ancillary and laboratory) are listed below for reference.    Significant Diagnostic Studies: DG Chest 2 View  Result Date: 12/30/2020 CLINICAL DATA:  Altered mental status. EXAM: CHEST - 2 VIEW COMPARISON:  Chest x-ray dated October 10, 2020. FINDINGS: The heart size and mediastinal contours are within normal limits. Normal pulmonary vascularity. No focal consolidation, pleural effusion, or pneumothorax. No acute osseous abnormality. Chronic T11 compression fracture. IMPRESSION: 1. No acute cardiopulmonary disease. Electronically Signed   By: Obie Dredge M.D.   On: 12/30/2020 17:14   CT HEAD WO CONTRAST  Result Date: 12/30/2020 CLINICAL DATA:  Delirium. Additional history provided: Altered mental status today. EXAM: CT HEAD WITHOUT CONTRAST TECHNIQUE: Contiguous axial images were obtained from the base of the skull through the vertex without intravenous contrast. COMPARISON:  MRI/MRA head 10/11/2020.  Head CT 10/10/2020. FINDINGS: Brain: Mildly motion degraded exam. Mild cerebral atrophy. Large chronic cortical/subcortical left PCA territory infarct within the left parietal, occipital and temporal lobes. Ex vacuo dilatation of the left lateral ventricle atrium. Moderate patchy and ill-defined hypoattenuation within the cerebral white matter is nonspecific, but compatible with chronic small vessel ischemic disease. There is no acute intracranial hemorrhage. No acute demarcated cortical infarct. No extra-axial fluid collection. No evidence of intracranial mass. No midline shift. Vascular: No hyperdense vessel.  Atherosclerotic  calcifications. Skull: Normal. Negative for fracture or focal lesion. Sinuses/Orbits: Visualized orbits show no acute finding. No significant paranasal sinus disease at the imaged levels. IMPRESSION: No evidence of acute intracranial abnormality. Redemonstrated large chronic cortical/subcortical left PCA territory infarct. Stable background mild generalized parenchymal atrophy and moderate chronic small vessel ischemic disease. Electronically Signed   By: Jackey Loge DO   On: 12/30/2020 16:59    Microbiology: Recent Results (from the past 240 hour(s))  Urine culture     Status: Abnormal   Collection Time: 12/30/20  4:21 PM   Specimen: Urine, Catheterized  Result Value Ref Range Status   Specimen Description   Final    URINE, CATHETERIZED Performed at [facility], [address], [place], [place] [address]    Special Requests   Final    NONE Performed at [facility], [address] [place], [place] [address]    Culture (A)  Final    <10,000 COLONIES/mL INSIGNIFICANT GROWTH Performed at [facility], [address] [place], [place] [address]    Report Status 01/01/2021 FINAL  Final  SARS CORONAVIRUS 2 (TAT 6-24 HRS) Nasopharyngeal Nasopharyngeal Swab     Status: None   Collection Time: 12/30/20  7:21 PM   Specimen: Nasopharyngeal Swab  Result Value Ref Range Status   SARS Coronavirus 2 NEGATIVE NEGATIVE Final    Comment: (NOTE) SARS-CoV-2 target nucleic acids are NOT DETECTED.  The SARS-CoV-2 RNA is generally detectable in upper and lower respiratory specimens during the acute phase of infection.  Negative results do not preclude SARS-CoV-2 infection, do not rule out co-infections with other pathogens, and should not be used as the sole basis for treatment or other patient management decisions. Negative results must be combined with clinical observations, patient history, and epidemiological information. The expected result is Negative.  Fact Sheet for  Patients: HairSlick.no  Fact Sheet for Healthcare Providers: quierodirigir.com  This test is not yet approved or cleared by the [place] FDA and  has been authorized for detection and/or diagnosis of SARS-CoV-2 by FDA under an Emergency Use Authorization (EUA). This EUA will remain  in effect (meaning this test can be used) for the duration of the COVID-19 declaration under Se ction 564(b)(1) of the Act, 21 U.S.C. section 360bbb-3(b)(1), unless the authorization is terminated or revoked sooner.  Performed at [facility], [address] [place], [place] [address]      Labs: Basic Metabolic Panel: Recent Labs  Lab 12/30/20 1622 12/31/20 0549 01/01/21 0507  NA 143 143 141  K 4.1 2.9* 4.4  CL 119* 116* 115*  CO2 17* 17* 19*  GLUCOSE 136* 115* 101*  BUN 15 15 14   CREATININE 1.00 0.73 0.82  CALCIUM 9.4 8.9 9.2  MG  --   --  2.2   Liver Function Tests: Recent Labs  Lab 12/30/20 1622  AST 23  ALT 16  ALKPHOS 70  BILITOT 0.5  PROT 6.6  ALBUMIN 4.0    Recent Labs  Lab 12/30/20 1625  AMMONIA 15   CBC: Recent Labs  Lab 12/30/20 1622  WBC 5.2  NEUTROABS 3.9  HGB 12.5  HCT 39.2  MCV 101.0*  PLT 216    Signed:  Vassie Loll MD.  [facility] 01/01/2021, 1:33 PM

## 2021-01-01 NOTE — Progress Notes (Signed)
Nsg Discharge Note  Admit Date:  12/30/2020 Discharge date: 01/01/2021   Ronne Binning to be D/C'd  Home per MD order.  AVS completed.  Copy for chart, and copy for patient signed, and dated. Patient/caregiver able to verbalize understanding.  Discharge Medication: Allergies as of 01/01/2021   No Known Allergies     Medication List    STOP taking these medications   aspirin EC 81 MG tablet   busPIRone 15 MG tablet Commonly known as: BUSPAR   QUEtiapine 100 MG tablet Commonly known as: SEROQUEL     TAKE these medications   acetaminophen 325 MG tablet Commonly known as: TYLENOL Take 2 tablets (650 mg total) by mouth every 6 (six) hours as needed for mild pain (or Fever >/= 101).   allopurinol 300 MG tablet Commonly known as: ZYLOPRIM Take 300 mg by mouth daily.   atorvastatin 40 MG tablet Commonly known as: LIPITOR Take 1 tablet (40 mg total) by mouth at bedtime.   calcium-vitamin D 500-200 MG-UNIT tablet Commonly known as: OSCAL WITH D Take 1 tablet by mouth 2 (two) times daily.   clopidogrel 75 MG tablet Commonly known as: Plavix Take 1 tablet (75 mg total) by mouth daily.   cyanocobalamin 1000 MCG tablet Take 1 tablet (1,000 mcg total) by mouth daily. Start taking on: January 02, 2021   DULoxetine 60 MG capsule Commonly known as: CYMBALTA Take 1 capsule (60 mg total) by mouth every morning.   furosemide 20 MG tablet Commonly known as: LASIX Take 1 tablet (20 mg total) by mouth daily.   lamoTRIgine 25 MG tablet Commonly known as: LAMICTAL Take 50 mg by mouth 2 (two) times daily.   OVER THE COUNTER MEDICATION Daly- vite tablet   pantoprazole 40 MG tablet Commonly known as: PROTONIX Take 1 tablet (40 mg total) by mouth 2 (two) times daily. (Needs to be seen before next refill)   potassium chloride SA 20 MEQ tablet Commonly known as: KLOR-CON Take 1 tablet (20 mEq total) by mouth daily.   saccharomyces boulardii 250 MG capsule Commonly known as:  Florastor Take 1 capsule (250 mg total) by mouth 2 (two) times daily.   topiramate 50 MG tablet Commonly known as: TOPAMAX Take 50 mg by mouth 2 (two) times daily.   traZODone 50 MG tablet Commonly known as: DESYREL Take 50 mg by mouth at bedtime.   Vitamin D3 25 MCG (1000 UT) Caps Take by mouth.       Discharge Assessment: Vitals:   01/01/21 0533 01/01/21 1428  BP: 107/77 (!) 108/58  Pulse: 67 62  Resp:  18  Temp: 97.8 F (36.6 C) 98.5 F (36.9 C)  SpO2: 99% 97%   Skin clean, dry and intact without evidence of skin break down, no evidence of skin tears noted. IV catheter discontinued intact. Site without signs and symptoms of complications - no redness or edema noted at insertion site, patient denies c/o pain - only slight tenderness at site.  Dressing with slight pressure applied.  D/c Instructions-Education: Discharge instructions given to patient/family with verbalized understanding. D/c education completed with patient/family including follow up instructions, medication list, d/c activities limitations if indicated, with other d/c instructions as indicated by MD - patient able to verbalize understanding, all questions fully answered. Patient instructed to return to ED, call 911, or call MD for any changes in condition.  Patient escorted via [place], and D/C home via private auto.  Clovis Fredrickson, LPN 05/10/2034 5:97 PM

## 2021-01-03 ENCOUNTER — Other Ambulatory Visit: Payer: Self-pay | Admitting: Family Medicine

## 2021-01-03 DIAGNOSIS — K529 Noninfective gastroenteritis and colitis, unspecified: Secondary | ICD-10-CM

## 2021-01-06 DIAGNOSIS — I10 Essential (primary) hypertension: Secondary | ICD-10-CM | POA: Diagnosis not present

## 2021-01-06 DIAGNOSIS — G459 Transient cerebral ischemic attack, unspecified: Secondary | ICD-10-CM | POA: Diagnosis not present

## 2021-02-15 DIAGNOSIS — Z1159 Encounter for screening for other viral diseases: Secondary | ICD-10-CM | POA: Diagnosis not present

## 2021-02-15 DIAGNOSIS — R5383 Other fatigue: Secondary | ICD-10-CM | POA: Diagnosis not present

## 2021-02-15 DIAGNOSIS — Z9181 History of falling: Secondary | ICD-10-CM | POA: Diagnosis not present

## 2021-02-15 DIAGNOSIS — E559 Vitamin D deficiency, unspecified: Secondary | ICD-10-CM | POA: Diagnosis not present

## 2021-02-15 DIAGNOSIS — Z79899 Other long term (current) drug therapy: Secondary | ICD-10-CM | POA: Diagnosis not present

## 2021-02-15 DIAGNOSIS — R632 Polyphagia: Secondary | ICD-10-CM | POA: Diagnosis not present

## 2021-03-01 DIAGNOSIS — Z0000 Encounter for general adult medical examination without abnormal findings: Secondary | ICD-10-CM | POA: Diagnosis not present

## 2021-03-01 DIAGNOSIS — I10 Essential (primary) hypertension: Secondary | ICD-10-CM | POA: Diagnosis not present

## 2021-03-01 DIAGNOSIS — G459 Transient cerebral ischemic attack, unspecified: Secondary | ICD-10-CM | POA: Diagnosis not present

## 2021-03-15 DIAGNOSIS — M810 Age-related osteoporosis without current pathological fracture: Secondary | ICD-10-CM | POA: Diagnosis not present

## 2021-03-15 DIAGNOSIS — Z780 Asymptomatic menopausal state: Secondary | ICD-10-CM | POA: Diagnosis not present

## 2021-03-21 DIAGNOSIS — G8929 Other chronic pain: Secondary | ICD-10-CM | POA: Diagnosis not present

## 2021-03-21 DIAGNOSIS — Z9181 History of falling: Secondary | ICD-10-CM | POA: Diagnosis not present

## 2021-03-21 DIAGNOSIS — M545 Low back pain, unspecified: Secondary | ICD-10-CM | POA: Diagnosis not present

## 2021-03-21 DIAGNOSIS — R632 Polyphagia: Secondary | ICD-10-CM | POA: Diagnosis not present

## 2021-03-23 DIAGNOSIS — Z79899 Other long term (current) drug therapy: Secondary | ICD-10-CM | POA: Diagnosis not present

## 2021-03-23 DIAGNOSIS — R569 Unspecified convulsions: Secondary | ICD-10-CM | POA: Diagnosis not present

## 2021-03-23 DIAGNOSIS — M542 Cervicalgia: Secondary | ICD-10-CM | POA: Diagnosis not present

## 2021-03-23 DIAGNOSIS — M40209 Unspecified kyphosis, site unspecified: Secondary | ICD-10-CM | POA: Diagnosis not present

## 2021-03-23 DIAGNOSIS — M545 Low back pain, unspecified: Secondary | ICD-10-CM | POA: Diagnosis not present

## 2021-03-23 DIAGNOSIS — G894 Chronic pain syndrome: Secondary | ICD-10-CM | POA: Diagnosis not present

## 2021-04-19 DIAGNOSIS — M545 Low back pain, unspecified: Secondary | ICD-10-CM | POA: Diagnosis not present

## 2021-04-19 DIAGNOSIS — G8929 Other chronic pain: Secondary | ICD-10-CM | POA: Diagnosis not present

## 2021-04-19 DIAGNOSIS — R632 Polyphagia: Secondary | ICD-10-CM | POA: Diagnosis not present

## 2021-04-19 DIAGNOSIS — Z9181 History of falling: Secondary | ICD-10-CM | POA: Diagnosis not present

## 2021-04-21 DIAGNOSIS — Z79891 Long term (current) use of opiate analgesic: Secondary | ICD-10-CM | POA: Diagnosis not present

## 2021-04-21 DIAGNOSIS — M542 Cervicalgia: Secondary | ICD-10-CM | POA: Diagnosis not present

## 2021-04-21 DIAGNOSIS — M40209 Unspecified kyphosis, site unspecified: Secondary | ICD-10-CM | POA: Diagnosis not present

## 2021-04-21 DIAGNOSIS — M545 Low back pain, unspecified: Secondary | ICD-10-CM | POA: Diagnosis not present

## 2021-04-21 DIAGNOSIS — R569 Unspecified convulsions: Secondary | ICD-10-CM | POA: Diagnosis not present

## 2021-05-02 DIAGNOSIS — M810 Age-related osteoporosis without current pathological fracture: Secondary | ICD-10-CM | POA: Diagnosis not present

## 2021-05-17 DIAGNOSIS — G8929 Other chronic pain: Secondary | ICD-10-CM | POA: Diagnosis not present

## 2021-05-17 DIAGNOSIS — R632 Polyphagia: Secondary | ICD-10-CM | POA: Diagnosis not present

## 2021-05-17 DIAGNOSIS — Z9181 History of falling: Secondary | ICD-10-CM | POA: Diagnosis not present

## 2021-05-17 DIAGNOSIS — M545 Low back pain, unspecified: Secondary | ICD-10-CM | POA: Diagnosis not present

## 2021-05-24 DIAGNOSIS — Z79891 Long term (current) use of opiate analgesic: Secondary | ICD-10-CM | POA: Diagnosis not present

## 2021-05-24 DIAGNOSIS — M545 Low back pain, unspecified: Secondary | ICD-10-CM | POA: Diagnosis not present

## 2021-05-24 DIAGNOSIS — M542 Cervicalgia: Secondary | ICD-10-CM | POA: Diagnosis not present

## 2021-05-24 DIAGNOSIS — M546 Pain in thoracic spine: Secondary | ICD-10-CM | POA: Diagnosis not present

## 2021-05-24 DIAGNOSIS — M40209 Unspecified kyphosis, site unspecified: Secondary | ICD-10-CM | POA: Diagnosis not present

## 2021-05-24 DIAGNOSIS — R569 Unspecified convulsions: Secondary | ICD-10-CM | POA: Diagnosis not present

## 2021-05-25 DIAGNOSIS — K219 Gastro-esophageal reflux disease without esophagitis: Secondary | ICD-10-CM | POA: Diagnosis not present

## 2021-05-25 DIAGNOSIS — E785 Hyperlipidemia, unspecified: Secondary | ICD-10-CM | POA: Diagnosis not present

## 2021-05-25 DIAGNOSIS — G459 Transient cerebral ischemic attack, unspecified: Secondary | ICD-10-CM | POA: Diagnosis not present

## 2021-05-25 DIAGNOSIS — I10 Essential (primary) hypertension: Secondary | ICD-10-CM | POA: Diagnosis not present

## 2021-05-25 DIAGNOSIS — M10039 Idiopathic gout, unspecified wrist: Secondary | ICD-10-CM | POA: Diagnosis not present

## 2021-05-31 DIAGNOSIS — M546 Pain in thoracic spine: Secondary | ICD-10-CM | POA: Diagnosis not present

## 2021-05-31 DIAGNOSIS — M542 Cervicalgia: Secondary | ICD-10-CM | POA: Diagnosis not present

## 2021-05-31 DIAGNOSIS — M419 Scoliosis, unspecified: Secondary | ICD-10-CM | POA: Diagnosis not present

## 2021-05-31 DIAGNOSIS — M8588 Other specified disorders of bone density and structure, other site: Secondary | ICD-10-CM | POA: Diagnosis not present

## 2021-05-31 DIAGNOSIS — M545 Low back pain, unspecified: Secondary | ICD-10-CM | POA: Diagnosis not present

## 2021-05-31 DIAGNOSIS — R222 Localized swelling, mass and lump, trunk: Secondary | ICD-10-CM | POA: Diagnosis not present

## 2021-05-31 DIAGNOSIS — S22089A Unspecified fracture of T11-T12 vertebra, initial encounter for closed fracture: Secondary | ICD-10-CM | POA: Diagnosis not present

## 2021-05-31 DIAGNOSIS — M47815 Spondylosis without myelopathy or radiculopathy, thoracolumbar region: Secondary | ICD-10-CM | POA: Diagnosis not present

## 2021-05-31 DIAGNOSIS — R221 Localized swelling, mass and lump, neck: Secondary | ICD-10-CM | POA: Diagnosis not present

## 2021-06-24 DIAGNOSIS — M542 Cervicalgia: Secondary | ICD-10-CM | POA: Diagnosis not present

## 2021-06-24 DIAGNOSIS — M546 Pain in thoracic spine: Secondary | ICD-10-CM | POA: Diagnosis not present

## 2021-06-24 DIAGNOSIS — R569 Unspecified convulsions: Secondary | ICD-10-CM | POA: Diagnosis not present

## 2021-06-24 DIAGNOSIS — M545 Low back pain, unspecified: Secondary | ICD-10-CM | POA: Diagnosis not present

## 2021-06-24 DIAGNOSIS — Z79891 Long term (current) use of opiate analgesic: Secondary | ICD-10-CM | POA: Diagnosis not present

## 2021-06-24 DIAGNOSIS — M40209 Unspecified kyphosis, site unspecified: Secondary | ICD-10-CM | POA: Diagnosis not present

## 2021-07-12 DIAGNOSIS — Z0000 Encounter for general adult medical examination without abnormal findings: Secondary | ICD-10-CM | POA: Diagnosis not present

## 2021-07-12 DIAGNOSIS — E559 Vitamin D deficiency, unspecified: Secondary | ICD-10-CM | POA: Diagnosis not present

## 2021-07-12 DIAGNOSIS — Z23 Encounter for immunization: Secondary | ICD-10-CM | POA: Diagnosis not present

## 2021-07-12 DIAGNOSIS — E7800 Pure hypercholesterolemia, unspecified: Secondary | ICD-10-CM | POA: Diagnosis not present

## 2021-07-12 DIAGNOSIS — R632 Polyphagia: Secondary | ICD-10-CM | POA: Diagnosis not present

## 2021-07-12 DIAGNOSIS — Z131 Encounter for screening for diabetes mellitus: Secondary | ICD-10-CM | POA: Diagnosis not present

## 2021-07-12 DIAGNOSIS — Z1159 Encounter for screening for other viral diseases: Secondary | ICD-10-CM | POA: Diagnosis not present

## 2021-07-12 DIAGNOSIS — Z9181 History of falling: Secondary | ICD-10-CM | POA: Diagnosis not present

## 2021-07-12 DIAGNOSIS — Z79899 Other long term (current) drug therapy: Secondary | ICD-10-CM | POA: Diagnosis not present

## 2021-07-12 DIAGNOSIS — R0602 Shortness of breath: Secondary | ICD-10-CM | POA: Diagnosis not present

## 2021-07-12 DIAGNOSIS — R5383 Other fatigue: Secondary | ICD-10-CM | POA: Diagnosis not present

## 2021-07-25 DIAGNOSIS — I10 Essential (primary) hypertension: Secondary | ICD-10-CM | POA: Diagnosis not present

## 2021-07-25 DIAGNOSIS — G459 Transient cerebral ischemic attack, unspecified: Secondary | ICD-10-CM | POA: Diagnosis not present

## 2021-07-25 DIAGNOSIS — E785 Hyperlipidemia, unspecified: Secondary | ICD-10-CM | POA: Diagnosis not present

## 2021-07-25 DIAGNOSIS — Z8673 Personal history of transient ischemic attack (TIA), and cerebral infarction without residual deficits: Secondary | ICD-10-CM | POA: Diagnosis not present

## 2021-07-25 DIAGNOSIS — K219 Gastro-esophageal reflux disease without esophagitis: Secondary | ICD-10-CM | POA: Diagnosis not present

## 2021-07-25 DIAGNOSIS — M10039 Idiopathic gout, unspecified wrist: Secondary | ICD-10-CM | POA: Diagnosis not present

## 2021-07-28 DIAGNOSIS — M546 Pain in thoracic spine: Secondary | ICD-10-CM | POA: Diagnosis not present

## 2021-07-28 DIAGNOSIS — M545 Low back pain, unspecified: Secondary | ICD-10-CM | POA: Diagnosis not present

## 2021-07-28 DIAGNOSIS — Z79891 Long term (current) use of opiate analgesic: Secondary | ICD-10-CM | POA: Diagnosis not present

## 2021-07-28 DIAGNOSIS — M542 Cervicalgia: Secondary | ICD-10-CM | POA: Diagnosis not present

## 2021-07-28 DIAGNOSIS — M40209 Unspecified kyphosis, site unspecified: Secondary | ICD-10-CM | POA: Diagnosis not present

## 2021-07-28 DIAGNOSIS — R569 Unspecified convulsions: Secondary | ICD-10-CM | POA: Diagnosis not present

## 2021-08-08 DIAGNOSIS — L959 Vasculitis limited to the skin, unspecified: Secondary | ICD-10-CM | POA: Diagnosis not present

## 2021-08-08 DIAGNOSIS — L570 Actinic keratosis: Secondary | ICD-10-CM | POA: Diagnosis not present

## 2021-08-09 DIAGNOSIS — M545 Low back pain, unspecified: Secondary | ICD-10-CM | POA: Diagnosis not present

## 2021-08-09 DIAGNOSIS — E559 Vitamin D deficiency, unspecified: Secondary | ICD-10-CM | POA: Diagnosis not present

## 2021-08-09 DIAGNOSIS — E781 Pure hyperglyceridemia: Secondary | ICD-10-CM | POA: Diagnosis not present

## 2021-08-09 DIAGNOSIS — Z9181 History of falling: Secondary | ICD-10-CM | POA: Diagnosis not present

## 2021-08-09 DIAGNOSIS — R632 Polyphagia: Secondary | ICD-10-CM | POA: Diagnosis not present

## 2021-08-25 DIAGNOSIS — R569 Unspecified convulsions: Secondary | ICD-10-CM | POA: Diagnosis not present

## 2021-08-25 DIAGNOSIS — M546 Pain in thoracic spine: Secondary | ICD-10-CM | POA: Diagnosis not present

## 2021-08-25 DIAGNOSIS — Z79891 Long term (current) use of opiate analgesic: Secondary | ICD-10-CM | POA: Diagnosis not present

## 2021-08-25 DIAGNOSIS — M545 Low back pain, unspecified: Secondary | ICD-10-CM | POA: Diagnosis not present

## 2021-08-25 DIAGNOSIS — M40209 Unspecified kyphosis, site unspecified: Secondary | ICD-10-CM | POA: Diagnosis not present

## 2021-08-25 DIAGNOSIS — M542 Cervicalgia: Secondary | ICD-10-CM | POA: Diagnosis not present

## 2021-09-29 DIAGNOSIS — Z1231 Encounter for screening mammogram for malignant neoplasm of breast: Secondary | ICD-10-CM | POA: Diagnosis not present

## 2021-12-26 DIAGNOSIS — Z79899 Other long term (current) drug therapy: Secondary | ICD-10-CM | POA: Diagnosis not present

## 2021-12-26 DIAGNOSIS — G894 Chronic pain syndrome: Secondary | ICD-10-CM | POA: Diagnosis not present

## 2021-12-26 DIAGNOSIS — M545 Low back pain, unspecified: Secondary | ICD-10-CM | POA: Diagnosis not present

## 2021-12-26 DIAGNOSIS — Z79891 Long term (current) use of opiate analgesic: Secondary | ICD-10-CM | POA: Diagnosis not present

## 2021-12-26 DIAGNOSIS — M48062 Spinal stenosis, lumbar region with neurogenic claudication: Secondary | ICD-10-CM | POA: Diagnosis not present

## 2021-12-26 DIAGNOSIS — R4182 Altered mental status, unspecified: Secondary | ICD-10-CM | POA: Diagnosis not present

## 2021-12-26 DIAGNOSIS — M130 Polyarthritis, unspecified: Secondary | ICD-10-CM | POA: Diagnosis not present

## 2021-12-26 DIAGNOSIS — M542 Cervicalgia: Secondary | ICD-10-CM | POA: Diagnosis not present

## 2022-01-30 DIAGNOSIS — M109 Gout, unspecified: Secondary | ICD-10-CM | POA: Diagnosis not present

## 2022-01-30 DIAGNOSIS — K219 Gastro-esophageal reflux disease without esophagitis: Secondary | ICD-10-CM | POA: Diagnosis not present

## 2022-01-30 DIAGNOSIS — F3131 Bipolar disorder, current episode depressed, mild: Secondary | ICD-10-CM | POA: Diagnosis not present

## 2022-01-30 DIAGNOSIS — Z6831 Body mass index (BMI) 31.0-31.9, adult: Secondary | ICD-10-CM | POA: Diagnosis not present

## 2022-01-30 DIAGNOSIS — Z8673 Personal history of transient ischemic attack (TIA), and cerebral infarction without residual deficits: Secondary | ICD-10-CM | POA: Diagnosis not present

## 2022-01-30 DIAGNOSIS — E785 Hyperlipidemia, unspecified: Secondary | ICD-10-CM | POA: Diagnosis not present

## 2022-01-30 DIAGNOSIS — I10 Essential (primary) hypertension: Secondary | ICD-10-CM | POA: Diagnosis not present

## 2022-02-20 DIAGNOSIS — M48062 Spinal stenosis, lumbar region with neurogenic claudication: Secondary | ICD-10-CM | POA: Diagnosis not present

## 2022-02-20 DIAGNOSIS — Z79891 Long term (current) use of opiate analgesic: Secondary | ICD-10-CM | POA: Diagnosis not present

## 2022-02-20 DIAGNOSIS — M542 Cervicalgia: Secondary | ICD-10-CM | POA: Diagnosis not present

## 2022-02-20 DIAGNOSIS — G894 Chronic pain syndrome: Secondary | ICD-10-CM | POA: Diagnosis not present

## 2022-02-20 DIAGNOSIS — M545 Low back pain, unspecified: Secondary | ICD-10-CM | POA: Diagnosis not present

## 2022-02-20 DIAGNOSIS — M130 Polyarthritis, unspecified: Secondary | ICD-10-CM | POA: Diagnosis not present

## 2022-03-12 DIAGNOSIS — G8911 Acute pain due to trauma: Secondary | ICD-10-CM | POA: Diagnosis not present

## 2022-03-12 DIAGNOSIS — R6510 Systemic inflammatory response syndrome (SIRS) of non-infectious origin without acute organ dysfunction: Secondary | ICD-10-CM | POA: Diagnosis not present

## 2022-03-12 DIAGNOSIS — E119 Type 2 diabetes mellitus without complications: Secondary | ICD-10-CM | POA: Diagnosis not present

## 2022-03-12 DIAGNOSIS — I10 Essential (primary) hypertension: Secondary | ICD-10-CM | POA: Diagnosis not present

## 2022-03-12 DIAGNOSIS — T3110 Burns involving 10-19% of body surface with 0% to 9% third degree burns: Secondary | ICD-10-CM | POA: Diagnosis not present

## 2022-03-12 DIAGNOSIS — Z781 Physical restraint status: Secondary | ICD-10-CM | POA: Diagnosis not present

## 2022-03-12 DIAGNOSIS — T300 Burn of unspecified body region, unspecified degree: Secondary | ICD-10-CM | POA: Diagnosis not present

## 2022-03-12 DIAGNOSIS — I959 Hypotension, unspecified: Secondary | ICD-10-CM | POA: Diagnosis not present

## 2022-03-12 DIAGNOSIS — F32A Depression, unspecified: Secondary | ICD-10-CM | POA: Diagnosis not present

## 2022-03-12 DIAGNOSIS — T2020XA Burn of second degree of head, face, and neck, unspecified site, initial encounter: Secondary | ICD-10-CM | POA: Diagnosis not present

## 2022-03-12 DIAGNOSIS — E440 Moderate protein-calorie malnutrition: Secondary | ICD-10-CM | POA: Diagnosis not present

## 2022-03-12 DIAGNOSIS — M6281 Muscle weakness (generalized): Secondary | ICD-10-CM | POA: Diagnosis not present

## 2022-03-12 DIAGNOSIS — T2124XA Burn of second degree of lower back, initial encounter: Secondary | ICD-10-CM | POA: Diagnosis not present

## 2022-03-12 DIAGNOSIS — R000 Tachycardia, unspecified: Secondary | ICD-10-CM | POA: Diagnosis not present

## 2022-03-12 DIAGNOSIS — Z743 Need for continuous supervision: Secondary | ICD-10-CM | POA: Diagnosis not present

## 2022-03-12 DIAGNOSIS — R0689 Other abnormalities of breathing: Secondary | ICD-10-CM | POA: Diagnosis not present

## 2022-03-12 DIAGNOSIS — T2220XA Burn of second degree of shoulder and upper limb, except wrist and hand, unspecified site, initial encounter: Secondary | ICD-10-CM | POA: Diagnosis not present

## 2022-03-12 DIAGNOSIS — T3111 Burns involving 10-19% of body surface with 10-19% third degree burns: Secondary | ICD-10-CM | POA: Diagnosis not present

## 2022-03-12 DIAGNOSIS — R0902 Hypoxemia: Secondary | ICD-10-CM | POA: Diagnosis not present

## 2022-03-12 DIAGNOSIS — I63232 Cerebral infarction due to unspecified occlusion or stenosis of left carotid arteries: Secondary | ICD-10-CM | POA: Diagnosis not present

## 2022-03-12 DIAGNOSIS — D62 Acute posthemorrhagic anemia: Secondary | ICD-10-CM | POA: Diagnosis not present

## 2022-03-12 DIAGNOSIS — T25222A Burn of second degree of left foot, initial encounter: Secondary | ICD-10-CM | POA: Diagnosis not present

## 2022-03-12 DIAGNOSIS — T25221A Burn of second degree of right foot, initial encounter: Secondary | ICD-10-CM | POA: Diagnosis not present

## 2022-03-12 DIAGNOSIS — F329 Major depressive disorder, single episode, unspecified: Secondary | ICD-10-CM | POA: Diagnosis not present

## 2022-03-12 DIAGNOSIS — Z4659 Encounter for fitting and adjustment of other gastrointestinal appliance and device: Secondary | ICD-10-CM | POA: Diagnosis not present

## 2022-03-12 DIAGNOSIS — Z6828 Body mass index (BMI) 28.0-28.9, adult: Secondary | ICD-10-CM | POA: Diagnosis not present

## 2022-03-12 DIAGNOSIS — T2134XA Burn of third degree of lower back, initial encounter: Secondary | ICD-10-CM | POA: Diagnosis not present

## 2022-03-12 DIAGNOSIS — R531 Weakness: Secondary | ICD-10-CM | POA: Diagnosis not present

## 2022-03-12 DIAGNOSIS — T22351A Burn of third degree of right shoulder, initial encounter: Secondary | ICD-10-CM | POA: Diagnosis not present

## 2022-03-12 DIAGNOSIS — T22352A Burn of third degree of left shoulder, initial encounter: Secondary | ICD-10-CM | POA: Diagnosis not present

## 2022-03-12 DIAGNOSIS — Z01810 Encounter for preprocedural cardiovascular examination: Secondary | ICD-10-CM | POA: Diagnosis not present

## 2022-03-12 DIAGNOSIS — T2230XA Burn of third degree of shoulder and upper limb, except wrist and hand, unspecified site, initial encounter: Secondary | ICD-10-CM | POA: Diagnosis not present

## 2022-03-12 DIAGNOSIS — Z8673 Personal history of transient ischemic attack (TIA), and cerebral infarction without residual deficits: Secondary | ICD-10-CM | POA: Diagnosis not present

## 2022-03-30 DIAGNOSIS — I63232 Cerebral infarction due to unspecified occlusion or stenosis of left carotid arteries: Secondary | ICD-10-CM | POA: Diagnosis not present

## 2022-03-30 DIAGNOSIS — E876 Hypokalemia: Secondary | ICD-10-CM | POA: Diagnosis not present

## 2022-03-30 DIAGNOSIS — T2220XA Burn of second degree of shoulder and upper limb, except wrist and hand, unspecified site, initial encounter: Secondary | ICD-10-CM | POA: Diagnosis not present

## 2022-03-30 DIAGNOSIS — T2124XA Burn of second degree of lower back, initial encounter: Secondary | ICD-10-CM | POA: Diagnosis not present

## 2022-03-30 DIAGNOSIS — T3111 Burns involving 10-19% of body surface with 10-19% third degree burns: Secondary | ICD-10-CM | POA: Diagnosis not present

## 2022-03-30 DIAGNOSIS — F419 Anxiety disorder, unspecified: Secondary | ICD-10-CM | POA: Diagnosis not present

## 2022-03-30 DIAGNOSIS — M6281 Muscle weakness (generalized): Secondary | ICD-10-CM | POA: Diagnosis not present

## 2022-03-30 DIAGNOSIS — T2020XA Burn of second degree of head, face, and neck, unspecified site, initial encounter: Secondary | ICD-10-CM | POA: Diagnosis not present

## 2022-03-30 DIAGNOSIS — T3110 Burns involving 10-19% of body surface with 0% to 9% third degree burns: Secondary | ICD-10-CM | POA: Diagnosis not present

## 2022-03-30 DIAGNOSIS — I959 Hypotension, unspecified: Secondary | ICD-10-CM | POA: Diagnosis not present

## 2022-03-30 DIAGNOSIS — Z6828 Body mass index (BMI) 28.0-28.9, adult: Secondary | ICD-10-CM | POA: Diagnosis not present

## 2022-03-30 DIAGNOSIS — T25222A Burn of second degree of left foot, initial encounter: Secondary | ICD-10-CM | POA: Diagnosis not present

## 2022-03-30 DIAGNOSIS — T25221A Burn of second degree of right foot, initial encounter: Secondary | ICD-10-CM | POA: Diagnosis not present

## 2022-03-30 DIAGNOSIS — J309 Allergic rhinitis, unspecified: Secondary | ICD-10-CM | POA: Diagnosis not present

## 2022-03-30 DIAGNOSIS — T300 Burn of unspecified body region, unspecified degree: Secondary | ICD-10-CM | POA: Diagnosis not present

## 2022-03-30 DIAGNOSIS — G8911 Acute pain due to trauma: Secondary | ICD-10-CM | POA: Diagnosis not present

## 2022-03-30 DIAGNOSIS — K219 Gastro-esophageal reflux disease without esophagitis: Secondary | ICD-10-CM | POA: Diagnosis not present

## 2022-03-30 DIAGNOSIS — R6510 Systemic inflammatory response syndrome (SIRS) of non-infectious origin without acute organ dysfunction: Secondary | ICD-10-CM | POA: Diagnosis not present

## 2022-03-30 DIAGNOSIS — Z743 Need for continuous supervision: Secondary | ICD-10-CM | POA: Diagnosis not present

## 2022-03-30 DIAGNOSIS — E785 Hyperlipidemia, unspecified: Secondary | ICD-10-CM | POA: Diagnosis not present

## 2022-03-30 DIAGNOSIS — Z8673 Personal history of transient ischemic attack (TIA), and cerebral infarction without residual deficits: Secondary | ICD-10-CM | POA: Diagnosis not present

## 2022-03-30 DIAGNOSIS — I10 Essential (primary) hypertension: Secondary | ICD-10-CM | POA: Diagnosis not present

## 2022-03-30 DIAGNOSIS — E440 Moderate protein-calorie malnutrition: Secondary | ICD-10-CM | POA: Diagnosis not present

## 2022-03-30 DIAGNOSIS — R531 Weakness: Secondary | ICD-10-CM | POA: Diagnosis not present

## 2022-03-30 DIAGNOSIS — D649 Anemia, unspecified: Secondary | ICD-10-CM | POA: Diagnosis not present

## 2022-03-30 DIAGNOSIS — R400 Somnolence: Secondary | ICD-10-CM | POA: Diagnosis not present

## 2022-03-30 DIAGNOSIS — F329 Major depressive disorder, single episode, unspecified: Secondary | ICD-10-CM | POA: Diagnosis not present

## 2022-03-30 DIAGNOSIS — R000 Tachycardia, unspecified: Secondary | ICD-10-CM | POA: Diagnosis not present

## 2022-03-31 DIAGNOSIS — E785 Hyperlipidemia, unspecified: Secondary | ICD-10-CM | POA: Diagnosis not present

## 2022-03-31 DIAGNOSIS — F419 Anxiety disorder, unspecified: Secondary | ICD-10-CM | POA: Diagnosis not present

## 2022-03-31 DIAGNOSIS — T3111 Burns involving 10-19% of body surface with 10-19% third degree burns: Secondary | ICD-10-CM | POA: Diagnosis not present

## 2022-03-31 DIAGNOSIS — G8911 Acute pain due to trauma: Secondary | ICD-10-CM | POA: Diagnosis not present

## 2022-03-31 DIAGNOSIS — E440 Moderate protein-calorie malnutrition: Secondary | ICD-10-CM | POA: Diagnosis not present

## 2022-03-31 DIAGNOSIS — J309 Allergic rhinitis, unspecified: Secondary | ICD-10-CM | POA: Diagnosis not present

## 2022-03-31 DIAGNOSIS — I63232 Cerebral infarction due to unspecified occlusion or stenosis of left carotid arteries: Secondary | ICD-10-CM | POA: Diagnosis not present

## 2022-03-31 DIAGNOSIS — K219 Gastro-esophageal reflux disease without esophagitis: Secondary | ICD-10-CM | POA: Diagnosis not present

## 2022-03-31 DIAGNOSIS — E876 Hypokalemia: Secondary | ICD-10-CM | POA: Diagnosis not present

## 2022-04-03 DIAGNOSIS — G8911 Acute pain due to trauma: Secondary | ICD-10-CM | POA: Diagnosis not present

## 2022-04-03 DIAGNOSIS — R400 Somnolence: Secondary | ICD-10-CM | POA: Diagnosis not present

## 2022-04-04 DIAGNOSIS — T3111 Burns involving 10-19% of body surface with 10-19% third degree burns: Secondary | ICD-10-CM | POA: Diagnosis not present

## 2022-04-04 DIAGNOSIS — G8911 Acute pain due to trauma: Secondary | ICD-10-CM | POA: Diagnosis not present

## 2022-04-05 DIAGNOSIS — I10 Essential (primary) hypertension: Secondary | ICD-10-CM | POA: Diagnosis not present

## 2022-04-05 DIAGNOSIS — F419 Anxiety disorder, unspecified: Secondary | ICD-10-CM | POA: Diagnosis not present

## 2022-04-05 DIAGNOSIS — F329 Major depressive disorder, single episode, unspecified: Secondary | ICD-10-CM | POA: Diagnosis not present

## 2022-04-05 DIAGNOSIS — T3111 Burns involving 10-19% of body surface with 10-19% third degree burns: Secondary | ICD-10-CM | POA: Diagnosis not present

## 2022-04-06 DIAGNOSIS — T3111 Burns involving 10-19% of body surface with 10-19% third degree burns: Secondary | ICD-10-CM | POA: Diagnosis not present

## 2022-04-06 DIAGNOSIS — G8911 Acute pain due to trauma: Secondary | ICD-10-CM | POA: Diagnosis not present

## 2022-04-11 DIAGNOSIS — F419 Anxiety disorder, unspecified: Secondary | ICD-10-CM | POA: Diagnosis not present

## 2022-04-20 DIAGNOSIS — G8911 Acute pain due to trauma: Secondary | ICD-10-CM | POA: Diagnosis not present

## 2022-04-20 DIAGNOSIS — T3111 Burns involving 10-19% of body surface with 10-19% third degree burns: Secondary | ICD-10-CM | POA: Diagnosis not present

## 2022-04-22 DIAGNOSIS — E785 Hyperlipidemia, unspecified: Secondary | ICD-10-CM | POA: Diagnosis not present

## 2022-04-22 DIAGNOSIS — I63232 Cerebral infarction due to unspecified occlusion or stenosis of left carotid arteries: Secondary | ICD-10-CM | POA: Diagnosis not present

## 2022-04-22 DIAGNOSIS — J309 Allergic rhinitis, unspecified: Secondary | ICD-10-CM | POA: Diagnosis not present

## 2022-04-22 DIAGNOSIS — G8911 Acute pain due to trauma: Secondary | ICD-10-CM | POA: Diagnosis not present

## 2022-04-22 DIAGNOSIS — F419 Anxiety disorder, unspecified: Secondary | ICD-10-CM | POA: Diagnosis not present

## 2022-04-22 DIAGNOSIS — K219 Gastro-esophageal reflux disease without esophagitis: Secondary | ICD-10-CM | POA: Diagnosis not present

## 2022-04-22 DIAGNOSIS — E440 Moderate protein-calorie malnutrition: Secondary | ICD-10-CM | POA: Diagnosis not present

## 2022-04-22 DIAGNOSIS — T3111 Burns involving 10-19% of body surface with 10-19% third degree burns: Secondary | ICD-10-CM | POA: Diagnosis not present

## 2022-04-22 DIAGNOSIS — E876 Hypokalemia: Secondary | ICD-10-CM | POA: Diagnosis not present

## 2022-06-05 DIAGNOSIS — E785 Hyperlipidemia, unspecified: Secondary | ICD-10-CM | POA: Diagnosis not present

## 2022-06-05 DIAGNOSIS — Z6830 Body mass index (BMI) 30.0-30.9, adult: Secondary | ICD-10-CM | POA: Diagnosis not present

## 2022-06-05 DIAGNOSIS — Z8673 Personal history of transient ischemic attack (TIA), and cerebral infarction without residual deficits: Secondary | ICD-10-CM | POA: Diagnosis not present

## 2022-06-05 DIAGNOSIS — Z0000 Encounter for general adult medical examination without abnormal findings: Secondary | ICD-10-CM | POA: Diagnosis not present

## 2022-06-05 DIAGNOSIS — M109 Gout, unspecified: Secondary | ICD-10-CM | POA: Diagnosis not present

## 2022-06-05 DIAGNOSIS — F3131 Bipolar disorder, current episode depressed, mild: Secondary | ICD-10-CM | POA: Diagnosis not present

## 2022-06-05 DIAGNOSIS — K219 Gastro-esophageal reflux disease without esophagitis: Secondary | ICD-10-CM | POA: Diagnosis not present

## 2022-06-05 DIAGNOSIS — I10 Essential (primary) hypertension: Secondary | ICD-10-CM | POA: Diagnosis not present

## 2022-08-23 DIAGNOSIS — Z6830 Body mass index (BMI) 30.0-30.9, adult: Secondary | ICD-10-CM | POA: Diagnosis not present

## 2022-08-23 DIAGNOSIS — Z8673 Personal history of transient ischemic attack (TIA), and cerebral infarction without residual deficits: Secondary | ICD-10-CM | POA: Diagnosis not present

## 2022-08-23 DIAGNOSIS — M109 Gout, unspecified: Secondary | ICD-10-CM | POA: Diagnosis not present

## 2022-08-23 DIAGNOSIS — F3131 Bipolar disorder, current episode depressed, mild: Secondary | ICD-10-CM | POA: Diagnosis not present

## 2022-08-23 DIAGNOSIS — Z0000 Encounter for general adult medical examination without abnormal findings: Secondary | ICD-10-CM | POA: Diagnosis not present

## 2022-08-23 DIAGNOSIS — K219 Gastro-esophageal reflux disease without esophagitis: Secondary | ICD-10-CM | POA: Diagnosis not present

## 2022-08-23 DIAGNOSIS — E785 Hyperlipidemia, unspecified: Secondary | ICD-10-CM | POA: Diagnosis not present

## 2022-08-23 DIAGNOSIS — R0602 Shortness of breath: Secondary | ICD-10-CM | POA: Diagnosis not present

## 2022-08-23 DIAGNOSIS — I10 Essential (primary) hypertension: Secondary | ICD-10-CM | POA: Diagnosis not present

## 2022-09-05 DIAGNOSIS — R931 Abnormal findings on diagnostic imaging of heart and coronary circulation: Secondary | ICD-10-CM | POA: Diagnosis not present

## 2022-09-05 DIAGNOSIS — R0600 Dyspnea, unspecified: Secondary | ICD-10-CM | POA: Diagnosis not present

## 2022-09-13 ENCOUNTER — Encounter (INDEPENDENT_AMBULATORY_CARE_PROVIDER_SITE_OTHER): Payer: Self-pay | Admitting: *Deleted

## 2022-09-25 DIAGNOSIS — I071 Rheumatic tricuspid insufficiency: Secondary | ICD-10-CM | POA: Diagnosis not present

## 2022-09-25 DIAGNOSIS — R0602 Shortness of breath: Secondary | ICD-10-CM | POA: Diagnosis not present

## 2022-11-27 DIAGNOSIS — S0083XA Contusion of other part of head, initial encounter: Secondary | ICD-10-CM | POA: Diagnosis not present

## 2022-11-27 DIAGNOSIS — S82892A Other fracture of left lower leg, initial encounter for closed fracture: Secondary | ICD-10-CM | POA: Diagnosis not present

## 2022-11-27 DIAGNOSIS — Z7902 Long term (current) use of antithrombotics/antiplatelets: Secondary | ICD-10-CM | POA: Diagnosis not present

## 2022-11-27 DIAGNOSIS — Z043 Encounter for examination and observation following other accident: Secondary | ICD-10-CM | POA: Diagnosis not present

## 2022-11-27 DIAGNOSIS — S82832A Other fracture of upper and lower end of left fibula, initial encounter for closed fracture: Secondary | ICD-10-CM | POA: Diagnosis not present

## 2022-11-27 DIAGNOSIS — R220 Localized swelling, mass and lump, head: Secondary | ICD-10-CM | POA: Diagnosis not present

## 2022-11-27 DIAGNOSIS — R079 Chest pain, unspecified: Secondary | ICD-10-CM | POA: Diagnosis not present

## 2022-11-27 DIAGNOSIS — W01198A Fall on same level from slipping, tripping and stumbling with subsequent striking against other object, initial encounter: Secondary | ICD-10-CM | POA: Diagnosis not present

## 2022-11-27 DIAGNOSIS — M7989 Other specified soft tissue disorders: Secondary | ICD-10-CM | POA: Diagnosis not present

## 2022-11-27 DIAGNOSIS — E860 Dehydration: Secondary | ICD-10-CM | POA: Diagnosis not present

## 2022-11-27 DIAGNOSIS — Z8673 Personal history of transient ischemic attack (TIA), and cerebral infarction without residual deficits: Secondary | ICD-10-CM | POA: Diagnosis not present

## 2022-11-27 DIAGNOSIS — R42 Dizziness and giddiness: Secondary | ICD-10-CM | POA: Diagnosis not present

## 2022-11-27 DIAGNOSIS — S8252XA Displaced fracture of medial malleolus of left tibia, initial encounter for closed fracture: Secondary | ICD-10-CM | POA: Diagnosis not present

## 2022-11-27 DIAGNOSIS — S79912A Unspecified injury of left hip, initial encounter: Secondary | ICD-10-CM | POA: Diagnosis not present

## 2022-11-27 DIAGNOSIS — M1712 Unilateral primary osteoarthritis, left knee: Secondary | ICD-10-CM | POA: Diagnosis not present

## 2022-11-27 DIAGNOSIS — S8262XA Displaced fracture of lateral malleolus of left fibula, initial encounter for closed fracture: Secondary | ICD-10-CM | POA: Diagnosis not present

## 2022-11-27 DIAGNOSIS — S0993XA Unspecified injury of face, initial encounter: Secondary | ICD-10-CM | POA: Diagnosis not present

## 2022-11-27 DIAGNOSIS — I959 Hypotension, unspecified: Secondary | ICD-10-CM | POA: Diagnosis not present

## 2022-11-27 DIAGNOSIS — M25572 Pain in left ankle and joints of left foot: Secondary | ICD-10-CM | POA: Diagnosis not present

## 2022-11-27 DIAGNOSIS — R9431 Abnormal electrocardiogram [ECG] [EKG]: Secondary | ICD-10-CM | POA: Diagnosis not present

## 2022-11-27 DIAGNOSIS — M47812 Spondylosis without myelopathy or radiculopathy, cervical region: Secondary | ICD-10-CM | POA: Diagnosis not present

## 2022-11-27 DIAGNOSIS — S0990XA Unspecified injury of head, initial encounter: Secondary | ICD-10-CM | POA: Diagnosis not present

## 2022-11-27 DIAGNOSIS — R531 Weakness: Secondary | ICD-10-CM | POA: Diagnosis not present

## 2022-11-27 DIAGNOSIS — W19XXXA Unspecified fall, initial encounter: Secondary | ICD-10-CM | POA: Diagnosis not present

## 2022-11-28 DIAGNOSIS — G40909 Epilepsy, unspecified, not intractable, without status epilepticus: Secondary | ICD-10-CM | POA: Diagnosis not present

## 2022-11-28 DIAGNOSIS — Z8673 Personal history of transient ischemic attack (TIA), and cerebral infarction without residual deficits: Secondary | ICD-10-CM | POA: Diagnosis not present

## 2022-11-28 DIAGNOSIS — N3000 Acute cystitis without hematuria: Secondary | ICD-10-CM | POA: Diagnosis not present

## 2022-11-28 DIAGNOSIS — S0083XA Contusion of other part of head, initial encounter: Secondary | ICD-10-CM | POA: Diagnosis not present

## 2022-11-28 DIAGNOSIS — S0990XD Unspecified injury of head, subsequent encounter: Secondary | ICD-10-CM | POA: Diagnosis not present

## 2022-11-28 DIAGNOSIS — S82852A Displaced trimalleolar fracture of left lower leg, initial encounter for closed fracture: Secondary | ICD-10-CM | POA: Diagnosis not present

## 2022-11-28 DIAGNOSIS — S82892K Other fracture of left lower leg, subsequent encounter for closed fracture with nonunion: Secondary | ICD-10-CM | POA: Diagnosis not present

## 2022-11-28 DIAGNOSIS — R339 Retention of urine, unspecified: Secondary | ICD-10-CM | POA: Diagnosis not present

## 2022-11-28 DIAGNOSIS — M810 Age-related osteoporosis without current pathological fracture: Secondary | ICD-10-CM | POA: Diagnosis not present

## 2022-11-28 DIAGNOSIS — M25461 Effusion, right knee: Secondary | ICD-10-CM | POA: Diagnosis not present

## 2022-11-28 DIAGNOSIS — S0990XA Unspecified injury of head, initial encounter: Secondary | ICD-10-CM | POA: Diagnosis not present

## 2022-11-28 DIAGNOSIS — S8290XA Unspecified fracture of unspecified lower leg, initial encounter for closed fracture: Secondary | ICD-10-CM | POA: Diagnosis not present

## 2022-11-28 DIAGNOSIS — S82892A Other fracture of left lower leg, initial encounter for closed fracture: Secondary | ICD-10-CM | POA: Diagnosis not present

## 2022-11-28 DIAGNOSIS — I480 Paroxysmal atrial fibrillation: Secondary | ICD-10-CM | POA: Diagnosis not present

## 2022-11-28 DIAGNOSIS — Z7902 Long term (current) use of antithrombotics/antiplatelets: Secondary | ICD-10-CM | POA: Diagnosis not present

## 2022-11-28 DIAGNOSIS — E785 Hyperlipidemia, unspecified: Secondary | ICD-10-CM | POA: Diagnosis not present

## 2022-11-28 DIAGNOSIS — I10 Essential (primary) hypertension: Secondary | ICD-10-CM | POA: Diagnosis not present

## 2022-11-28 DIAGNOSIS — S82842A Displaced bimalleolar fracture of left lower leg, initial encounter for closed fracture: Secondary | ICD-10-CM | POA: Diagnosis not present

## 2022-11-28 DIAGNOSIS — I69398 Other sequelae of cerebral infarction: Secondary | ICD-10-CM | POA: Diagnosis not present

## 2022-11-28 DIAGNOSIS — D62 Acute posthemorrhagic anemia: Secondary | ICD-10-CM | POA: Diagnosis not present

## 2022-11-28 DIAGNOSIS — S8262XA Displaced fracture of lateral malleolus of left fibula, initial encounter for closed fracture: Secondary | ICD-10-CM | POA: Diagnosis not present

## 2022-11-28 DIAGNOSIS — E860 Dehydration: Secondary | ICD-10-CM | POA: Diagnosis not present

## 2022-11-28 DIAGNOSIS — F32A Depression, unspecified: Secondary | ICD-10-CM | POA: Diagnosis not present

## 2022-11-28 DIAGNOSIS — S99912A Unspecified injury of left ankle, initial encounter: Secondary | ICD-10-CM | POA: Diagnosis not present

## 2022-11-28 DIAGNOSIS — Z4789 Encounter for other orthopedic aftercare: Secondary | ICD-10-CM | POA: Diagnosis not present

## 2022-11-28 DIAGNOSIS — Z743 Need for continuous supervision: Secondary | ICD-10-CM | POA: Diagnosis not present

## 2022-11-28 DIAGNOSIS — S8252XA Displaced fracture of medial malleolus of left tibia, initial encounter for closed fracture: Secondary | ICD-10-CM | POA: Diagnosis not present

## 2022-11-28 DIAGNOSIS — R9431 Abnormal electrocardiogram [ECG] [EKG]: Secondary | ICD-10-CM | POA: Diagnosis not present

## 2022-11-28 DIAGNOSIS — R531 Weakness: Secondary | ICD-10-CM | POA: Diagnosis not present

## 2022-12-07 DIAGNOSIS — N3000 Acute cystitis without hematuria: Secondary | ICD-10-CM | POA: Diagnosis not present

## 2022-12-07 DIAGNOSIS — E559 Vitamin D deficiency, unspecified: Secondary | ICD-10-CM | POA: Diagnosis not present

## 2022-12-07 DIAGNOSIS — M79605 Pain in left leg: Secondary | ICD-10-CM | POA: Diagnosis not present

## 2022-12-07 DIAGNOSIS — S82892A Other fracture of left lower leg, initial encounter for closed fracture: Secondary | ICD-10-CM | POA: Diagnosis not present

## 2022-12-07 DIAGNOSIS — I10 Essential (primary) hypertension: Secondary | ICD-10-CM | POA: Diagnosis not present

## 2022-12-07 DIAGNOSIS — S0990XD Unspecified injury of head, subsequent encounter: Secondary | ICD-10-CM | POA: Diagnosis not present

## 2022-12-07 DIAGNOSIS — Z743 Need for continuous supervision: Secondary | ICD-10-CM | POA: Diagnosis not present

## 2022-12-07 DIAGNOSIS — Z8673 Personal history of transient ischemic attack (TIA), and cerebral infarction without residual deficits: Secondary | ICD-10-CM | POA: Diagnosis not present

## 2022-12-07 DIAGNOSIS — S82852D Displaced trimalleolar fracture of left lower leg, subsequent encounter for closed fracture with routine healing: Secondary | ICD-10-CM | POA: Diagnosis not present

## 2022-12-07 DIAGNOSIS — G40909 Epilepsy, unspecified, not intractable, without status epilepticus: Secondary | ICD-10-CM | POA: Diagnosis not present

## 2022-12-07 DIAGNOSIS — Z4789 Encounter for other orthopedic aftercare: Secondary | ICD-10-CM | POA: Diagnosis not present

## 2022-12-07 DIAGNOSIS — S82892D Other fracture of left lower leg, subsequent encounter for closed fracture with routine healing: Secondary | ICD-10-CM | POA: Diagnosis not present

## 2022-12-07 DIAGNOSIS — M109 Gout, unspecified: Secondary | ICD-10-CM | POA: Diagnosis not present

## 2022-12-07 DIAGNOSIS — R5381 Other malaise: Secondary | ICD-10-CM | POA: Diagnosis not present

## 2022-12-07 DIAGNOSIS — S82892K Other fracture of left lower leg, subsequent encounter for closed fracture with nonunion: Secondary | ICD-10-CM | POA: Diagnosis not present

## 2022-12-07 DIAGNOSIS — E785 Hyperlipidemia, unspecified: Secondary | ICD-10-CM | POA: Diagnosis not present

## 2022-12-07 DIAGNOSIS — S0993XA Unspecified injury of face, initial encounter: Secondary | ICD-10-CM | POA: Diagnosis not present

## 2022-12-07 DIAGNOSIS — R339 Retention of urine, unspecified: Secondary | ICD-10-CM | POA: Diagnosis not present

## 2022-12-07 DIAGNOSIS — D649 Anemia, unspecified: Secondary | ICD-10-CM | POA: Diagnosis not present

## 2022-12-07 DIAGNOSIS — S8290XA Unspecified fracture of unspecified lower leg, initial encounter for closed fracture: Secondary | ICD-10-CM | POA: Diagnosis not present

## 2022-12-07 DIAGNOSIS — D62 Acute posthemorrhagic anemia: Secondary | ICD-10-CM | POA: Diagnosis not present

## 2022-12-07 DIAGNOSIS — F419 Anxiety disorder, unspecified: Secondary | ICD-10-CM | POA: Diagnosis not present

## 2022-12-07 DIAGNOSIS — I69398 Other sequelae of cerebral infarction: Secondary | ICD-10-CM | POA: Diagnosis not present

## 2022-12-07 DIAGNOSIS — F329 Major depressive disorder, single episode, unspecified: Secondary | ICD-10-CM | POA: Diagnosis not present

## 2022-12-07 DIAGNOSIS — M810 Age-related osteoporosis without current pathological fracture: Secondary | ICD-10-CM | POA: Diagnosis not present

## 2022-12-08 DIAGNOSIS — E785 Hyperlipidemia, unspecified: Secondary | ICD-10-CM | POA: Diagnosis not present

## 2022-12-08 DIAGNOSIS — S82892D Other fracture of left lower leg, subsequent encounter for closed fracture with routine healing: Secondary | ICD-10-CM | POA: Diagnosis not present

## 2022-12-08 DIAGNOSIS — I10 Essential (primary) hypertension: Secondary | ICD-10-CM | POA: Diagnosis not present

## 2022-12-08 DIAGNOSIS — M109 Gout, unspecified: Secondary | ICD-10-CM | POA: Diagnosis not present

## 2022-12-08 DIAGNOSIS — Z8673 Personal history of transient ischemic attack (TIA), and cerebral infarction without residual deficits: Secondary | ICD-10-CM | POA: Diagnosis not present

## 2022-12-12 DIAGNOSIS — S82892A Other fracture of left lower leg, initial encounter for closed fracture: Secondary | ICD-10-CM | POA: Diagnosis not present

## 2022-12-12 DIAGNOSIS — S0993XA Unspecified injury of face, initial encounter: Secondary | ICD-10-CM | POA: Diagnosis not present

## 2022-12-12 DIAGNOSIS — R5381 Other malaise: Secondary | ICD-10-CM | POA: Diagnosis not present

## 2022-12-13 DIAGNOSIS — S82892D Other fracture of left lower leg, subsequent encounter for closed fracture with routine healing: Secondary | ICD-10-CM | POA: Diagnosis not present

## 2022-12-13 DIAGNOSIS — M810 Age-related osteoporosis without current pathological fracture: Secondary | ICD-10-CM | POA: Diagnosis not present

## 2022-12-13 DIAGNOSIS — G40909 Epilepsy, unspecified, not intractable, without status epilepticus: Secondary | ICD-10-CM | POA: Diagnosis not present

## 2022-12-13 DIAGNOSIS — D62 Acute posthemorrhagic anemia: Secondary | ICD-10-CM | POA: Diagnosis not present

## 2022-12-13 DIAGNOSIS — E785 Hyperlipidemia, unspecified: Secondary | ICD-10-CM | POA: Diagnosis not present

## 2022-12-13 DIAGNOSIS — Z8673 Personal history of transient ischemic attack (TIA), and cerebral infarction without residual deficits: Secondary | ICD-10-CM | POA: Diagnosis not present

## 2022-12-13 DIAGNOSIS — F419 Anxiety disorder, unspecified: Secondary | ICD-10-CM | POA: Diagnosis not present

## 2022-12-13 DIAGNOSIS — M109 Gout, unspecified: Secondary | ICD-10-CM | POA: Diagnosis not present

## 2022-12-13 DIAGNOSIS — I10 Essential (primary) hypertension: Secondary | ICD-10-CM | POA: Diagnosis not present

## 2022-12-15 DIAGNOSIS — M810 Age-related osteoporosis without current pathological fracture: Secondary | ICD-10-CM | POA: Diagnosis not present

## 2022-12-15 DIAGNOSIS — S0990XD Unspecified injury of head, subsequent encounter: Secondary | ICD-10-CM | POA: Diagnosis not present

## 2022-12-15 DIAGNOSIS — D649 Anemia, unspecified: Secondary | ICD-10-CM | POA: Diagnosis not present

## 2022-12-15 DIAGNOSIS — F329 Major depressive disorder, single episode, unspecified: Secondary | ICD-10-CM | POA: Diagnosis not present

## 2022-12-15 DIAGNOSIS — E785 Hyperlipidemia, unspecified: Secondary | ICD-10-CM | POA: Diagnosis not present

## 2022-12-15 DIAGNOSIS — S82852D Displaced trimalleolar fracture of left lower leg, subsequent encounter for closed fracture with routine healing: Secondary | ICD-10-CM | POA: Diagnosis not present

## 2022-12-15 DIAGNOSIS — S82892K Other fracture of left lower leg, subsequent encounter for closed fracture with nonunion: Secondary | ICD-10-CM | POA: Diagnosis not present

## 2022-12-15 DIAGNOSIS — Z4789 Encounter for other orthopedic aftercare: Secondary | ICD-10-CM | POA: Diagnosis not present

## 2022-12-15 DIAGNOSIS — Z8673 Personal history of transient ischemic attack (TIA), and cerebral infarction without residual deficits: Secondary | ICD-10-CM | POA: Diagnosis not present

## 2022-12-15 DIAGNOSIS — D62 Acute posthemorrhagic anemia: Secondary | ICD-10-CM | POA: Diagnosis not present

## 2022-12-15 DIAGNOSIS — I69398 Other sequelae of cerebral infarction: Secondary | ICD-10-CM | POA: Diagnosis not present

## 2022-12-15 DIAGNOSIS — G40909 Epilepsy, unspecified, not intractable, without status epilepticus: Secondary | ICD-10-CM | POA: Diagnosis not present

## 2022-12-15 DIAGNOSIS — I10 Essential (primary) hypertension: Secondary | ICD-10-CM | POA: Diagnosis not present

## 2022-12-19 DIAGNOSIS — I10 Essential (primary) hypertension: Secondary | ICD-10-CM | POA: Diagnosis not present

## 2022-12-19 DIAGNOSIS — F329 Major depressive disorder, single episode, unspecified: Secondary | ICD-10-CM | POA: Diagnosis not present

## 2022-12-19 DIAGNOSIS — S82852D Displaced trimalleolar fracture of left lower leg, subsequent encounter for closed fracture with routine healing: Secondary | ICD-10-CM | POA: Diagnosis not present

## 2022-12-19 DIAGNOSIS — M810 Age-related osteoporosis without current pathological fracture: Secondary | ICD-10-CM | POA: Diagnosis not present

## 2022-12-25 DIAGNOSIS — I10 Essential (primary) hypertension: Secondary | ICD-10-CM | POA: Diagnosis not present

## 2022-12-25 DIAGNOSIS — D649 Anemia, unspecified: Secondary | ICD-10-CM | POA: Diagnosis not present

## 2022-12-25 DIAGNOSIS — E785 Hyperlipidemia, unspecified: Secondary | ICD-10-CM | POA: Diagnosis not present

## 2023-01-01 DIAGNOSIS — S82892D Other fracture of left lower leg, subsequent encounter for closed fracture with routine healing: Secondary | ICD-10-CM | POA: Diagnosis not present

## 2023-01-01 DIAGNOSIS — G40909 Epilepsy, unspecified, not intractable, without status epilepticus: Secondary | ICD-10-CM | POA: Diagnosis not present

## 2023-01-01 DIAGNOSIS — I10 Essential (primary) hypertension: Secondary | ICD-10-CM | POA: Diagnosis not present

## 2023-01-03 DIAGNOSIS — M6281 Muscle weakness (generalized): Secondary | ICD-10-CM | POA: Diagnosis not present

## 2023-01-08 DIAGNOSIS — D649 Anemia, unspecified: Secondary | ICD-10-CM | POA: Diagnosis not present

## 2023-01-08 DIAGNOSIS — G40909 Epilepsy, unspecified, not intractable, without status epilepticus: Secondary | ICD-10-CM | POA: Diagnosis not present

## 2023-01-08 DIAGNOSIS — I10 Essential (primary) hypertension: Secondary | ICD-10-CM | POA: Diagnosis not present

## 2023-01-15 DIAGNOSIS — I10 Essential (primary) hypertension: Secondary | ICD-10-CM | POA: Diagnosis not present

## 2023-01-23 DIAGNOSIS — S82852D Displaced trimalleolar fracture of left lower leg, subsequent encounter for closed fracture with routine healing: Secondary | ICD-10-CM | POA: Diagnosis not present

## 2023-01-24 DIAGNOSIS — M6281 Muscle weakness (generalized): Secondary | ICD-10-CM | POA: Diagnosis not present

## 2023-01-25 DIAGNOSIS — G4700 Insomnia, unspecified: Secondary | ICD-10-CM | POA: Diagnosis not present

## 2023-01-25 DIAGNOSIS — F339 Major depressive disorder, recurrent, unspecified: Secondary | ICD-10-CM | POA: Diagnosis not present

## 2023-01-25 DIAGNOSIS — S82892D Other fracture of left lower leg, subsequent encounter for closed fracture with routine healing: Secondary | ICD-10-CM | POA: Diagnosis not present

## 2023-01-25 DIAGNOSIS — M6281 Muscle weakness (generalized): Secondary | ICD-10-CM | POA: Diagnosis not present

## 2023-01-25 DIAGNOSIS — F419 Anxiety disorder, unspecified: Secondary | ICD-10-CM | POA: Diagnosis not present

## 2023-01-26 DIAGNOSIS — I119 Hypertensive heart disease without heart failure: Secondary | ICD-10-CM | POA: Diagnosis not present

## 2023-01-26 DIAGNOSIS — G629 Polyneuropathy, unspecified: Secondary | ICD-10-CM | POA: Diagnosis not present

## 2023-01-26 DIAGNOSIS — D649 Anemia, unspecified: Secondary | ICD-10-CM | POA: Diagnosis not present

## 2023-01-26 DIAGNOSIS — S82892K Other fracture of left lower leg, subsequent encounter for closed fracture with nonunion: Secondary | ICD-10-CM | POA: Diagnosis not present

## 2023-01-26 DIAGNOSIS — Z8673 Personal history of transient ischemic attack (TIA), and cerebral infarction without residual deficits: Secondary | ICD-10-CM | POA: Diagnosis not present

## 2023-01-26 DIAGNOSIS — Z4789 Encounter for other orthopedic aftercare: Secondary | ICD-10-CM | POA: Diagnosis not present

## 2023-01-26 DIAGNOSIS — E785 Hyperlipidemia, unspecified: Secondary | ICD-10-CM | POA: Diagnosis not present

## 2023-01-26 DIAGNOSIS — I10 Essential (primary) hypertension: Secondary | ICD-10-CM | POA: Diagnosis not present

## 2023-01-26 DIAGNOSIS — F339 Major depressive disorder, recurrent, unspecified: Secondary | ICD-10-CM | POA: Diagnosis not present

## 2023-01-26 DIAGNOSIS — G4700 Insomnia, unspecified: Secondary | ICD-10-CM | POA: Diagnosis not present

## 2023-01-26 DIAGNOSIS — M810 Age-related osteoporosis without current pathological fracture: Secondary | ICD-10-CM | POA: Diagnosis not present

## 2023-01-26 DIAGNOSIS — G40909 Epilepsy, unspecified, not intractable, without status epilepticus: Secondary | ICD-10-CM | POA: Diagnosis not present

## 2023-01-26 DIAGNOSIS — S0990XD Unspecified injury of head, subsequent encounter: Secondary | ICD-10-CM | POA: Diagnosis not present

## 2023-01-26 DIAGNOSIS — F419 Anxiety disorder, unspecified: Secondary | ICD-10-CM | POA: Diagnosis not present

## 2023-01-26 DIAGNOSIS — D62 Acute posthemorrhagic anemia: Secondary | ICD-10-CM | POA: Diagnosis not present

## 2023-01-26 DIAGNOSIS — I69398 Other sequelae of cerebral infarction: Secondary | ICD-10-CM | POA: Diagnosis not present

## 2023-01-26 DIAGNOSIS — M109 Gout, unspecified: Secondary | ICD-10-CM | POA: Diagnosis not present

## 2023-01-31 DIAGNOSIS — I119 Hypertensive heart disease without heart failure: Secondary | ICD-10-CM | POA: Diagnosis not present

## 2023-01-31 DIAGNOSIS — D649 Anemia, unspecified: Secondary | ICD-10-CM | POA: Diagnosis not present

## 2023-02-01 DIAGNOSIS — F419 Anxiety disorder, unspecified: Secondary | ICD-10-CM | POA: Diagnosis not present

## 2023-02-01 DIAGNOSIS — F339 Major depressive disorder, recurrent, unspecified: Secondary | ICD-10-CM | POA: Diagnosis not present

## 2023-02-01 DIAGNOSIS — G4700 Insomnia, unspecified: Secondary | ICD-10-CM | POA: Diagnosis not present

## 2023-02-03 DIAGNOSIS — F339 Major depressive disorder, recurrent, unspecified: Secondary | ICD-10-CM | POA: Diagnosis not present

## 2023-02-03 DIAGNOSIS — F419 Anxiety disorder, unspecified: Secondary | ICD-10-CM | POA: Diagnosis not present

## 2023-02-08 DIAGNOSIS — F419 Anxiety disorder, unspecified: Secondary | ICD-10-CM | POA: Diagnosis not present

## 2023-02-08 DIAGNOSIS — G629 Polyneuropathy, unspecified: Secondary | ICD-10-CM | POA: Diagnosis not present

## 2023-02-08 DIAGNOSIS — E785 Hyperlipidemia, unspecified: Secondary | ICD-10-CM | POA: Diagnosis not present

## 2023-02-08 DIAGNOSIS — M109 Gout, unspecified: Secondary | ICD-10-CM | POA: Diagnosis not present

## 2023-02-08 DIAGNOSIS — F339 Major depressive disorder, recurrent, unspecified: Secondary | ICD-10-CM | POA: Diagnosis not present

## 2023-02-08 DIAGNOSIS — G4700 Insomnia, unspecified: Secondary | ICD-10-CM | POA: Diagnosis not present

## 2023-02-20 ENCOUNTER — Encounter (INDEPENDENT_AMBULATORY_CARE_PROVIDER_SITE_OTHER): Payer: Self-pay | Admitting: *Deleted

## 2023-03-26 DIAGNOSIS — K219 Gastro-esophageal reflux disease without esophagitis: Secondary | ICD-10-CM | POA: Diagnosis not present

## 2023-03-26 DIAGNOSIS — M84472S Pathological fracture, left ankle, sequela: Secondary | ICD-10-CM | POA: Diagnosis not present

## 2023-03-26 DIAGNOSIS — E785 Hyperlipidemia, unspecified: Secondary | ICD-10-CM | POA: Diagnosis not present

## 2023-03-26 DIAGNOSIS — Z6830 Body mass index (BMI) 30.0-30.9, adult: Secondary | ICD-10-CM | POA: Diagnosis not present

## 2023-03-26 DIAGNOSIS — M109 Gout, unspecified: Secondary | ICD-10-CM | POA: Diagnosis not present

## 2023-03-26 DIAGNOSIS — I10 Essential (primary) hypertension: Secondary | ICD-10-CM | POA: Diagnosis not present

## 2023-03-26 DIAGNOSIS — Z8673 Personal history of transient ischemic attack (TIA), and cerebral infarction without residual deficits: Secondary | ICD-10-CM | POA: Diagnosis not present

## 2023-03-26 DIAGNOSIS — F3131 Bipolar disorder, current episode depressed, mild: Secondary | ICD-10-CM | POA: Diagnosis not present

## 2023-03-26 DIAGNOSIS — Z0000 Encounter for general adult medical examination without abnormal findings: Secondary | ICD-10-CM | POA: Diagnosis not present

## 2023-03-26 DIAGNOSIS — I5032 Chronic diastolic (congestive) heart failure: Secondary | ICD-10-CM | POA: Diagnosis not present

## 2023-03-29 ENCOUNTER — Encounter (INDEPENDENT_AMBULATORY_CARE_PROVIDER_SITE_OTHER): Payer: Self-pay | Admitting: *Deleted

## 2023-04-10 DIAGNOSIS — S82852D Displaced trimalleolar fracture of left lower leg, subsequent encounter for closed fracture with routine healing: Secondary | ICD-10-CM | POA: Diagnosis not present

## 2023-06-25 DIAGNOSIS — Z131 Encounter for screening for diabetes mellitus: Secondary | ICD-10-CM | POA: Diagnosis not present

## 2023-06-25 DIAGNOSIS — K219 Gastro-esophageal reflux disease without esophagitis: Secondary | ICD-10-CM | POA: Diagnosis not present

## 2023-06-25 DIAGNOSIS — M109 Gout, unspecified: Secondary | ICD-10-CM | POA: Diagnosis not present

## 2023-06-25 DIAGNOSIS — I5032 Chronic diastolic (congestive) heart failure: Secondary | ICD-10-CM | POA: Diagnosis not present

## 2023-06-25 DIAGNOSIS — Z6829 Body mass index (BMI) 29.0-29.9, adult: Secondary | ICD-10-CM | POA: Diagnosis not present

## 2023-06-25 DIAGNOSIS — I10 Essential (primary) hypertension: Secondary | ICD-10-CM | POA: Diagnosis not present

## 2023-06-25 DIAGNOSIS — E785 Hyperlipidemia, unspecified: Secondary | ICD-10-CM | POA: Diagnosis not present

## 2023-06-25 DIAGNOSIS — Z8673 Personal history of transient ischemic attack (TIA), and cerebral infarction without residual deficits: Secondary | ICD-10-CM | POA: Diagnosis not present

## 2023-06-25 DIAGNOSIS — F3131 Bipolar disorder, current episode depressed, mild: Secondary | ICD-10-CM | POA: Diagnosis not present

## 2023-06-25 DIAGNOSIS — N182 Chronic kidney disease, stage 2 (mild): Secondary | ICD-10-CM | POA: Diagnosis not present

## 2023-08-02 DIAGNOSIS — Z6832 Body mass index (BMI) 32.0-32.9, adult: Secondary | ICD-10-CM | POA: Diagnosis not present

## 2023-08-02 DIAGNOSIS — I10 Essential (primary) hypertension: Secondary | ICD-10-CM | POA: Diagnosis not present

## 2023-08-02 DIAGNOSIS — R197 Diarrhea, unspecified: Secondary | ICD-10-CM | POA: Diagnosis not present

## 2023-09-06 DIAGNOSIS — G43909 Migraine, unspecified, not intractable, without status migrainosus: Secondary | ICD-10-CM | POA: Diagnosis not present

## 2023-09-06 DIAGNOSIS — R197 Diarrhea, unspecified: Secondary | ICD-10-CM | POA: Diagnosis not present

## 2023-09-06 DIAGNOSIS — K219 Gastro-esophageal reflux disease without esophagitis: Secondary | ICD-10-CM | POA: Diagnosis not present

## 2023-09-06 DIAGNOSIS — G629 Polyneuropathy, unspecified: Secondary | ICD-10-CM | POA: Diagnosis not present

## 2023-09-06 DIAGNOSIS — I110 Hypertensive heart disease with heart failure: Secondary | ICD-10-CM | POA: Diagnosis not present

## 2023-09-06 DIAGNOSIS — E785 Hyperlipidemia, unspecified: Secondary | ICD-10-CM | POA: Diagnosis not present

## 2023-09-06 DIAGNOSIS — E876 Hypokalemia: Secondary | ICD-10-CM | POA: Diagnosis not present

## 2023-09-06 DIAGNOSIS — F419 Anxiety disorder, unspecified: Secondary | ICD-10-CM | POA: Diagnosis not present

## 2023-09-06 DIAGNOSIS — I509 Heart failure, unspecified: Secondary | ICD-10-CM | POA: Diagnosis not present

## 2023-09-06 DIAGNOSIS — Z008 Encounter for other general examination: Secondary | ICD-10-CM | POA: Diagnosis not present

## 2023-09-06 DIAGNOSIS — M810 Age-related osteoporosis without current pathological fracture: Secondary | ICD-10-CM | POA: Diagnosis not present

## 2023-09-06 DIAGNOSIS — M109 Gout, unspecified: Secondary | ICD-10-CM | POA: Diagnosis not present

## 2023-09-06 DIAGNOSIS — G4700 Insomnia, unspecified: Secondary | ICD-10-CM | POA: Diagnosis not present

## 2023-10-01 ENCOUNTER — Encounter (INDEPENDENT_AMBULATORY_CARE_PROVIDER_SITE_OTHER): Payer: Self-pay | Admitting: *Deleted

## 2023-10-01 DIAGNOSIS — Z6831 Body mass index (BMI) 31.0-31.9, adult: Secondary | ICD-10-CM | POA: Diagnosis not present

## 2023-10-01 DIAGNOSIS — Z0000 Encounter for general adult medical examination without abnormal findings: Secondary | ICD-10-CM | POA: Diagnosis not present

## 2023-10-01 DIAGNOSIS — I5032 Chronic diastolic (congestive) heart failure: Secondary | ICD-10-CM | POA: Diagnosis not present

## 2023-10-01 DIAGNOSIS — I10 Essential (primary) hypertension: Secondary | ICD-10-CM | POA: Diagnosis not present

## 2023-10-01 DIAGNOSIS — K219 Gastro-esophageal reflux disease without esophagitis: Secondary | ICD-10-CM | POA: Diagnosis not present

## 2023-10-01 DIAGNOSIS — M818 Other osteoporosis without current pathological fracture: Secondary | ICD-10-CM | POA: Diagnosis not present

## 2023-10-01 DIAGNOSIS — Z8673 Personal history of transient ischemic attack (TIA), and cerebral infarction without residual deficits: Secondary | ICD-10-CM | POA: Diagnosis not present

## 2023-10-01 DIAGNOSIS — I480 Paroxysmal atrial fibrillation: Secondary | ICD-10-CM | POA: Diagnosis not present

## 2023-10-01 DIAGNOSIS — E785 Hyperlipidemia, unspecified: Secondary | ICD-10-CM | POA: Diagnosis not present

## 2023-10-01 DIAGNOSIS — E1169 Type 2 diabetes mellitus with other specified complication: Secondary | ICD-10-CM | POA: Diagnosis not present

## 2023-10-01 DIAGNOSIS — N182 Chronic kidney disease, stage 2 (mild): Secondary | ICD-10-CM | POA: Diagnosis not present

## 2023-10-01 DIAGNOSIS — G40909 Epilepsy, unspecified, not intractable, without status epilepticus: Secondary | ICD-10-CM | POA: Diagnosis not present

## 2024-01-02 DIAGNOSIS — E785 Hyperlipidemia, unspecified: Secondary | ICD-10-CM | POA: Diagnosis not present

## 2024-01-02 DIAGNOSIS — Z8673 Personal history of transient ischemic attack (TIA), and cerebral infarction without residual deficits: Secondary | ICD-10-CM | POA: Diagnosis not present

## 2024-01-02 DIAGNOSIS — K219 Gastro-esophageal reflux disease without esophagitis: Secondary | ICD-10-CM | POA: Diagnosis not present

## 2024-01-02 DIAGNOSIS — M818 Other osteoporosis without current pathological fracture: Secondary | ICD-10-CM | POA: Diagnosis not present

## 2024-01-02 DIAGNOSIS — I480 Paroxysmal atrial fibrillation: Secondary | ICD-10-CM | POA: Diagnosis not present

## 2024-01-02 DIAGNOSIS — Z0000 Encounter for general adult medical examination without abnormal findings: Secondary | ICD-10-CM | POA: Diagnosis not present

## 2024-01-02 DIAGNOSIS — N182 Chronic kidney disease, stage 2 (mild): Secondary | ICD-10-CM | POA: Diagnosis not present

## 2024-01-02 DIAGNOSIS — G40909 Epilepsy, unspecified, not intractable, without status epilepticus: Secondary | ICD-10-CM | POA: Diagnosis not present

## 2024-01-02 DIAGNOSIS — F3131 Bipolar disorder, current episode depressed, mild: Secondary | ICD-10-CM | POA: Diagnosis not present

## 2024-01-02 DIAGNOSIS — I5032 Chronic diastolic (congestive) heart failure: Secondary | ICD-10-CM | POA: Diagnosis not present

## 2024-01-02 DIAGNOSIS — I10 Essential (primary) hypertension: Secondary | ICD-10-CM | POA: Diagnosis not present

## 2024-01-02 DIAGNOSIS — E1122 Type 2 diabetes mellitus with diabetic chronic kidney disease: Secondary | ICD-10-CM | POA: Diagnosis not present

## 2024-02-05 DIAGNOSIS — E1121 Type 2 diabetes mellitus with diabetic nephropathy: Secondary | ICD-10-CM | POA: Diagnosis not present

## 2024-04-09 DIAGNOSIS — Z6831 Body mass index (BMI) 31.0-31.9, adult: Secondary | ICD-10-CM | POA: Diagnosis not present

## 2024-04-09 DIAGNOSIS — E1122 Type 2 diabetes mellitus with diabetic chronic kidney disease: Secondary | ICD-10-CM | POA: Diagnosis not present

## 2024-04-09 DIAGNOSIS — K219 Gastro-esophageal reflux disease without esophagitis: Secondary | ICD-10-CM | POA: Diagnosis not present

## 2024-04-09 DIAGNOSIS — F3131 Bipolar disorder, current episode depressed, mild: Secondary | ICD-10-CM | POA: Diagnosis not present

## 2024-04-09 DIAGNOSIS — Z8673 Personal history of transient ischemic attack (TIA), and cerebral infarction without residual deficits: Secondary | ICD-10-CM | POA: Diagnosis not present

## 2024-04-09 DIAGNOSIS — G40909 Epilepsy, unspecified, not intractable, without status epilepticus: Secondary | ICD-10-CM | POA: Diagnosis not present

## 2024-04-09 DIAGNOSIS — Z0000 Encounter for general adult medical examination without abnormal findings: Secondary | ICD-10-CM | POA: Diagnosis not present

## 2024-04-09 DIAGNOSIS — E785 Hyperlipidemia, unspecified: Secondary | ICD-10-CM | POA: Diagnosis not present

## 2024-04-09 DIAGNOSIS — N182 Chronic kidney disease, stage 2 (mild): Secondary | ICD-10-CM | POA: Diagnosis not present

## 2024-04-09 DIAGNOSIS — I10 Essential (primary) hypertension: Secondary | ICD-10-CM | POA: Diagnosis not present

## 2024-04-09 DIAGNOSIS — I5032 Chronic diastolic (congestive) heart failure: Secondary | ICD-10-CM | POA: Diagnosis not present

## 2024-04-09 DIAGNOSIS — I480 Paroxysmal atrial fibrillation: Secondary | ICD-10-CM | POA: Diagnosis not present

## 2024-04-09 DIAGNOSIS — M818 Other osteoporosis without current pathological fracture: Secondary | ICD-10-CM | POA: Diagnosis not present

## 2024-04-09 DIAGNOSIS — G43909 Migraine, unspecified, not intractable, without status migrainosus: Secondary | ICD-10-CM | POA: Diagnosis not present

## 2024-04-22 ENCOUNTER — Other Ambulatory Visit (HOSPITAL_COMMUNITY): Payer: Self-pay | Admitting: Internal Medicine

## 2024-04-22 DIAGNOSIS — M810 Age-related osteoporosis without current pathological fracture: Secondary | ICD-10-CM

## 2024-04-23 DIAGNOSIS — H53031 Strabismic amblyopia, right eye: Secondary | ICD-10-CM | POA: Diagnosis not present

## 2024-04-23 DIAGNOSIS — H2513 Age-related nuclear cataract, bilateral: Secondary | ICD-10-CM | POA: Diagnosis not present

## 2024-04-23 DIAGNOSIS — H16223 Keratoconjunctivitis sicca, not specified as Sjogren's, bilateral: Secondary | ICD-10-CM | POA: Diagnosis not present

## 2024-04-23 DIAGNOSIS — H353132 Nonexudative age-related macular degeneration, bilateral, intermediate dry stage: Secondary | ICD-10-CM | POA: Diagnosis not present

## 2024-04-23 DIAGNOSIS — H18513 Endothelial corneal dystrophy, bilateral: Secondary | ICD-10-CM | POA: Diagnosis not present

## 2024-05-28 DIAGNOSIS — H2512 Age-related nuclear cataract, left eye: Secondary | ICD-10-CM | POA: Diagnosis not present

## 2024-06-03 NOTE — H&P (Signed)
 Surgical History & Physical  Patient Name: [name]  DOB: 31-Mar-1951  Surgery: Cataract extraction with intraocular lens implant phacoemulsification; Left Eye Surgeon: Marsa Cleverly MD Surgery Date: 06/06/2024 Pre-Op Date: 04/23/2024  HPI: A 26 Yr. old female patient present for cataract eval. 1. The patient complains of difficulty when driving during the day or at night due to glare and difficulties reading, which began 4 years ago. Both eyes are affected. The episode is gradual. The condition's severity is worsening. This is negatively affecting the patient's quality of life and the patient is unable to function adequately in life with the current level of vision. Question patients reliability on letters. 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 I tried numbers as well.  Medical History: Cataracts muscle sx OD as a child x2. Vision has always been weaker OD.  High Blood Pressure LDL Lung Problems Stroke  Review of Systems Cardiovascular High Blood Pressure Neurological Stroke, x3 Respiratory Shortness of Breath All recorded systems are negative except as noted above.  Social Never smoked  Medication Prednisolone-moxiflox-bromfen,  Clopidogrel , Tizanidine, Atorvastatin , Amlodipine, Lamotrigine , Losartan, Gabapentin, Mirtazapine, Topiramate , Colestipol, Furosemide , Potassium chloride , Allopurinol , Pantoprazole , Buspirone , Duloxetine   Sx/Procedures Muscle surgery x2 as a child,  Gastric Bypass, Ankle Surgery  Drug Allergies  NKDA  History & Physical: Heent: cataracts NECK: supple without bruits LUNGS: lungs clear to auscultation CV: regular rate and rhythm Abdomen: soft and non-tender  Impression & Plan: Assessment: 1.  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  CATARACT NUCLEAR SCLEROSIS AGE RELATED; Both Eyes (H25.13) 3.  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  s/p strabismus surgery x 2 as a child. Symmetric centrally now but has been worse vision OD throughout life  2.  Cataracts are visually significant and account for the patient's complaints. Discussed all risks, benefits, procedures and recovery, including infection, loss of vision and eye, need for glasses after surgery or additional procedures. Patient understands changing glasses will not improve vision. Patient indicated understanding of procedure. All questions answered. Patient desires to have surgery, recommend phacoemulsification with intraocular lens. Patient to have preliminary testing necessary ([employer]/IOL Master, Mac OCT, TOPO) Educational materials provided:Cataract.  Plan: - Proceed with cataract surgery OS followed by OD, discussed that I recommend doing surgery OD first given it has amblyopia but patient prefers OS first - Plan for best distance target with DIB00, discussed may still need glasses - No DM - mild fuchs - amblyopia with prior strabismus surgery - AMD OU with OD>OS - good dilation - Dextenza if available  3.  Dry eye. Mild signs of dry eye at this time. Can use artificial tears QID OU PRN and warm compresses once daily as needed.  4.  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 OCT mac obtained today. Diagnosis reviewed with patient. Suggest amsler grid check weekly or so and AREDs vitamin. Return in 6 months for repeat OCT mac.  5.  Very mild, monitor for now

## 2024-06-04 ENCOUNTER — Encounter (HOSPITAL_COMMUNITY): Payer: Self-pay

## 2024-06-04 ENCOUNTER — Encounter (HOSPITAL_COMMUNITY)
Admission: RE | Admit: 2024-06-04 | Discharge: 2024-06-04 | Disposition: A | Discharge status: Home / Self Care | Source: Ambulatory Visit | Attending: Optometry | Admitting: Optometry

## 2024-06-06 ENCOUNTER — Encounter (HOSPITAL_COMMUNITY)
Admission: RE | Disposition: A | Discharge status: Home / Self Care | Payer: Self-pay | Source: Home / Self Care | Attending: Optometry

## 2024-06-06 ENCOUNTER — Encounter (HOSPITAL_COMMUNITY): Payer: Self-pay | Admitting: Optometry

## 2024-06-06 ENCOUNTER — Ambulatory Visit (HOSPITAL_BASED_OUTPATIENT_CLINIC_OR_DEPARTMENT_OTHER): Payer: Self-pay | Admitting: Anesthesiology

## 2024-06-06 ENCOUNTER — Ambulatory Visit (HOSPITAL_COMMUNITY)
Admission: RE | Admit: 2024-06-06 | Discharge: 2024-06-06 | Disposition: A | Discharge status: Home / Self Care | Attending: Optometry | Admitting: Optometry

## 2024-06-06 ENCOUNTER — Ambulatory Visit (HOSPITAL_COMMUNITY): Payer: Self-pay | Admitting: Anesthesiology

## 2024-06-06 DIAGNOSIS — H16223 Keratoconjunctivitis sicca, not specified as Sjogren's, bilateral: Secondary | ICD-10-CM | POA: Diagnosis not present

## 2024-06-06 DIAGNOSIS — R569 Unspecified convulsions: Secondary | ICD-10-CM | POA: Insufficient documentation

## 2024-06-06 DIAGNOSIS — H53031 Strabismic amblyopia, right eye: Secondary | ICD-10-CM | POA: Diagnosis not present

## 2024-06-06 DIAGNOSIS — F418 Other specified anxiety disorders: Secondary | ICD-10-CM | POA: Diagnosis not present

## 2024-06-06 DIAGNOSIS — H2513 Age-related nuclear cataract, bilateral: Secondary | ICD-10-CM | POA: Insufficient documentation

## 2024-06-06 DIAGNOSIS — I69918 Other symptoms and signs involving cognitive functions following unspecified cerebrovascular disease: Secondary | ICD-10-CM | POA: Diagnosis not present

## 2024-06-06 DIAGNOSIS — I110 Hypertensive heart disease with heart failure: Secondary | ICD-10-CM | POA: Insufficient documentation

## 2024-06-06 DIAGNOSIS — H2512 Age-related nuclear cataract, left eye: Secondary | ICD-10-CM | POA: Diagnosis not present

## 2024-06-06 DIAGNOSIS — I5032 Chronic diastolic (congestive) heart failure: Secondary | ICD-10-CM | POA: Diagnosis not present

## 2024-06-06 DIAGNOSIS — H353132 Nonexudative age-related macular degeneration, bilateral, intermediate dry stage: Secondary | ICD-10-CM | POA: Diagnosis not present

## 2024-06-06 DIAGNOSIS — I509 Heart failure, unspecified: Secondary | ICD-10-CM | POA: Insufficient documentation

## 2024-06-06 DIAGNOSIS — I4891 Unspecified atrial fibrillation: Secondary | ICD-10-CM | POA: Diagnosis not present

## 2024-06-06 DIAGNOSIS — Z87891 Personal history of nicotine dependence: Secondary | ICD-10-CM

## 2024-06-06 DIAGNOSIS — H18513 Endothelial corneal dystrophy, bilateral: Secondary | ICD-10-CM | POA: Diagnosis not present

## 2024-06-06 HISTORY — PX: CATARACT EXTRACTION W/PHACO: SHX586

## 2024-06-06 SURGERY — PHACOEMULSIFICATION, CATARACT, WITH IOL INSERTION
Anesthesia: Monitor Anesthesia Care | Site: Eye | Laterality: Left

## 2024-06-06 MED ORDER — MOXIFLOXACIN HCL 5 MG/ML IO SOLN
INTRAOCULAR | Status: DC | PRN
  Administered 2024-06-06: .2 mL via INTRACAMERAL

## 2024-06-06 MED ORDER — BSS IO SOLN
INTRAOCULAR | Status: DC | PRN
  Administered 2024-06-06: 15 mL via INTRAOCULAR

## 2024-06-06 MED ORDER — STERILE WATER FOR IRRIGATION IR SOLN
Status: DC | PRN
  Administered 2024-06-06: 250 mL

## 2024-06-06 MED ORDER — TETRACAINE HCL 0.5 % OP SOLN
1.0000 [drp] | OPHTHALMIC | Status: AC | PRN
  Administered 2024-06-06 (×3): 1 [drp] via OPHTHALMIC

## 2024-06-06 MED ORDER — POVIDONE-IODINE 5 % OP SOLN
OPHTHALMIC | Status: DC | PRN
Start: 2024-06-06 — End: 2024-06-06
  Administered 2024-06-06: 1 via OPHTHALMIC

## 2024-06-06 MED ORDER — SIGHTPATH DOSE#1 NA HYALUR & NA CHOND-NA HYALUR IO KIT
PACK | INTRAOCULAR | Status: DC | PRN
  Administered 2024-06-06: 1 via OPHTHALMIC

## 2024-06-06 MED ORDER — LIDOCAINE HCL (PF) 1 % IJ SOLN
INTRAMUSCULAR | Status: DC | PRN
  Administered 2024-06-06: 2 mL

## 2024-06-06 MED ORDER — PHENYLEPHRINE HCL 2.5 % OP SOLN
1.0000 [drp] | OPHTHALMIC | Status: AC | PRN
  Administered 2024-06-06 (×3): 1 [drp] via OPHTHALMIC

## 2024-06-06 MED ORDER — PHENYLEPHRINE-KETOROLAC 1-0.3 % IO SOLN
INTRAOCULAR | Status: DC | PRN
  Administered 2024-06-06: 500 mL via OPHTHALMIC

## 2024-06-06 MED ORDER — LIDOCAINE HCL 3.5 % OP GEL
1.0000 | Freq: Once | OPHTHALMIC | Status: AC
  Administered 2024-06-06: 1 via OPHTHALMIC

## 2024-06-06 MED ORDER — TROPICAMIDE 1 % OP SOLN
1.0000 [drp] | OPHTHALMIC | Status: DC | PRN
  Administered 2024-06-06 (×2): 1 [drp] via OPHTHALMIC

## 2024-06-06 SURGICAL SUPPLY — 13 items
CLOTH BEACON ORANGE TIMEOUT ST (SAFETY) ×2 IMPLANT
DRSG TEGADERM 4X4.75 (GAUZE/BANDAGES/DRESSINGS) ×2 IMPLANT
EYE SHIELD UNIVERSAL CLEAR (GAUZE/BANDAGES/DRESSINGS) IMPLANT
FEE CATARACT SUITE SIGHTPATH (MISCELLANEOUS) ×2 IMPLANT
GLOVE BIOGEL PI IND STRL 7.0 (GLOVE) ×4 IMPLANT
LENS IOL TECNIS EYHANCE 16.0 (Intraocular Lens) IMPLANT
NDL HYPO 18GX1.5 BLUNT FILL (NEEDLE) ×2 IMPLANT
NEEDLE HYPO 18GX1.5 BLUNT FILL (NEEDLE) ×1 IMPLANT
PAD ARMBOARD POSITIONER FOAM (MISCELLANEOUS) ×2 IMPLANT
POSITIONER HEAD 8X9X4 ADT (SOFTGOODS) ×2 IMPLANT
SYR TB 1ML LL NO SAFETY (SYRINGE) ×2 IMPLANT
TAPE SURG TRANSPORE 1 IN (GAUZE/BANDAGES/DRESSINGS) IMPLANT
WATER STERILE IRR 250ML POUR (IV SOLUTION) ×2 IMPLANT

## 2024-06-06 NOTE — Interval H&P Note (Signed)
 History and Physical Interval Note:  06/06/2024 11:50 AM  The H and P was reviewed and updated. The patient was examined.  No changes were found after exam.  The surgical eye was marked.   [name]

## 2024-06-06 NOTE — Anesthesia Procedure Notes (Signed)
 Date/Time: 06/06/2024 12:29 PM  Performed by: Barbarann Verneita RAMAN, CRNAPre-anesthesia Checklist: Patient identified, Emergency Drugs available, Suction available, Timeout performed and Patient being monitored Patient Re-evaluated:Patient Re-evaluated prior to induction Oxygen  Delivery Method: Nasal Cannula

## 2024-06-06 NOTE — Anesthesia Preprocedure Evaluation (Signed)
 Anesthesia Evaluation  Patient identified by MRN, date of birth, ID band Patient awake    Reviewed: Allergy & Precautions, H&P , NPO status , Patient's Chart, lab work & pertinent test results, reviewed documented beta blocker date and time   Airway Mallampati: II  TM Distance: >3 FB Neck ROM: full    Dental no notable dental hx.    Pulmonary shortness of breath, former smoker   Pulmonary exam normal breath sounds clear to auscultation       Cardiovascular Exercise Tolerance: Good hypertension, +CHF  + dysrhythmias Atrial Fibrillation  Rhythm:regular Rate:Normal     Neuro/Psych Seizures -,  PSYCHIATRIC DISORDERS Anxiety Depression    TIACVA, Residual Symptoms    GI/Hepatic negative GI ROS, Neg liver ROS,,,  Endo/Other  negative endocrine ROS    Renal/GU negative Renal ROS  negative genitourinary   Musculoskeletal   Abdominal   Peds  Hematology negative hematology ROS (+)   Anesthesia Other Findings   Reproductive/Obstetrics negative OB ROS                              Anesthesia Physical Anesthesia Plan  ASA: 3  Anesthesia Plan: MAC   Post-op Pain Management:    Induction:   PONV Risk Score and Plan:   Airway Management Planned:   Additional Equipment:   Intra-op Plan:   Post-operative Plan:   Informed Consent: I have reviewed the patients History and Physical, chart, labs and discussed the procedure including the risks, benefits and alternatives for the proposed anesthesia with the patient or authorized representative who has indicated his/her understanding and acceptance.     Dental Advisory Given  Plan Discussed with: CRNA  Anesthesia Plan Comments:         Anesthesia Quick Evaluation

## 2024-06-06 NOTE — Op Note (Signed)
 Date of procedure: 06/06/24  Pre-operative diagnosis: Visually significant age-related nuclear cataract, Left Eye (H25.12)  Post-operative diagnosis: Visually significant age-related nuclear cataract, Left Eye H25.12  Procedure: Removal of cataract via phacoemulsification and insertion of intra-ocular lens J&J DIB00 +16.0D into the capsular bag of the Left Eye  Attending surgeon: Marsa JINNY Cleverly, MD  Anesthesia: MAC, Topical Akten   Complications: None  Estimated Blood Loss: <38mL (minimal)  Specimens: None  Implants:  Implant Name Type Inv. Item Serial No. Manufacturer Lot No. LRB No. Used Action  LENS IOL TECNIS EYHANCE 16.0 - [id number] Intraocular Lens LENS IOL TECNIS EYHANCE 16.0 [id number] SIGHTPATH  Left 1 Implanted    Indications:  Visually significant age-related cataract, Left Eye  Procedure:  The patient was seen and identified in the pre-operative area. The operative eye was identified and dilated.  The operative eye was marked.  Topical anesthesia was administered to the operative eye.     The patient was then to the operative suite and placed in the supine position.  A timeout was performed confirming the patient, procedure to be performed, and all other relevant information.   The patient's face was prepped and draped in the usual fashion for intra-ocular surgery.  A lid speculum was placed into the operative eye and the surgical microscope moved into place and focused.  An inferotemporal paracentesis was created using a 20 gauge paracentesis blade.  BSS mixed with Omidria , followed by 1% lidocaine  was injected into the anterior chamber.  Viscoelastic was injected into the anterior chamber.  A temporal clear-corneal main wound incision was created using a 2.54mm microkeratome.  A continuous curvilinear capsulorrhexis was initiated using an irrigating cystitome and completed using capsulorrhexis forceps.  Hydrodissection and hydrodeliniation were performed.  Viscoelastic  was injected into the anterior chamber.  A phacoemulsification handpiece and a chopper as a second instrument were used to remove the nucleus and epinucleus. The irrigation/aspiration handpiece was used to remove any remaining cortical material.   The capsular bag was reinflated with viscoelastic, checked, and found to be intact.  The intraocular lens was inserted into the capsular bag.  The irrigation/aspiration handpiece was used to remove any remaining viscoelastic.  The clear corneal wound and paracentesis wounds were then hydrated and checked with Weck-Cels to be watertight. Moxifloxacin  was instilled into the anterior chamber.  The lid-speculum and drape were removed. The patient's face was cleaned with a wet and dry 4x4.  A clear shield was taped over the eye. The patient was taken to the post-operative care unit in good condition, having tolerated the procedure well.  Post-Op Instructions: The patient will follow up at [facility] for a same day post-operative evaluation and will receive all other orders and instructions.

## 2024-06-06 NOTE — Transfer of Care (Signed)
 Immediate Anesthesia Transfer of Care Note  Patient: [name]  Procedure(s) Performed: PHACOEMULSIFICATION, CATARACT, WITH IOL INSERTION (Left: Eye)  Patient Location: Short Stay  Anesthesia Type:MAC  Level of Consciousness: awake and alert   Airway & Oxygen  Therapy: Patient Spontanous Breathing  Post-op Assessment: Report given to RN and Post -op Vital signs reviewed and stable  Post vital signs: Reviewed and stable  Last Vitals:  Vitals Value Taken Time  BP 142/70 06/06/24 12:55  Temp 36.7 C 06/06/24 12:55  Pulse 65 06/06/24 12:55  Resp 20 06/06/24 12:55  SpO2 100 % 06/06/24 12:55    Last Pain:  Vitals:   06/06/24 1255  PainSc: 0-No pain         Complications: No notable events documented.

## 2024-06-06 NOTE — Discharge Instructions (Signed)
 Please discharge patient when stable, will follow up today with Dr. Ilsa Iha at the [facility] office immediately following discharge.  Leave shield in place until visit.  All paperwork with discharge instructions will be given at the office.  [facility] Address:  [address]  [place], [place] [address]  Dr. Chaya Jan Phone: [phone]

## 2024-06-10 NOTE — Anesthesia Postprocedure Evaluation (Signed)
 Anesthesia Post Note  Patient: [name]  Procedure(s) Performed: PHACOEMULSIFICATION, CATARACT, WITH IOL INSERTION (Left: Eye)  Patient location during evaluation: Phase II Anesthesia Type: MAC Level of consciousness: awake Pain management: pain level controlled Vital Signs Assessment: post-procedure vital signs reviewed and stable Respiratory status: spontaneous breathing and respiratory function stable Cardiovascular status: blood pressure returned to baseline and stable Postop Assessment: no headache and no apparent nausea or vomiting Anesthetic complications: no Comments: Late entry   No notable events documented.   Last Vitals:  Vitals:   06/06/24 1255  BP: (!) 142/70  Pulse: 65  Resp: 20  Temp: 36.7 C  SpO2: 100%    Last Pain:  Vitals:   06/06/24 1255  PainSc: 0-No pain                 Yvonna JINNY Bosworth

## 2024-06-23 ENCOUNTER — Encounter (HOSPITAL_COMMUNITY)

## 2024-06-27 ENCOUNTER — Ambulatory Visit (HOSPITAL_COMMUNITY): Admit: 2024-06-27 | Admitting: Optometry

## 2024-06-27 SURGERY — PHACOEMULSIFICATION, CATARACT, WITH IOL INSERTION
Anesthesia: Monitor Anesthesia Care | Laterality: Right

## 2024-08-04 DIAGNOSIS — E785 Hyperlipidemia, unspecified: Secondary | ICD-10-CM | POA: Diagnosis not present

## 2024-08-04 DIAGNOSIS — N182 Chronic kidney disease, stage 2 (mild): Secondary | ICD-10-CM | POA: Diagnosis not present

## 2024-08-04 DIAGNOSIS — M109 Gout, unspecified: Secondary | ICD-10-CM | POA: Diagnosis not present

## 2024-08-04 DIAGNOSIS — G43909 Migraine, unspecified, not intractable, without status migrainosus: Secondary | ICD-10-CM | POA: Diagnosis not present

## 2024-08-04 DIAGNOSIS — E1122 Type 2 diabetes mellitus with diabetic chronic kidney disease: Secondary | ICD-10-CM | POA: Diagnosis not present

## 2024-08-04 DIAGNOSIS — K219 Gastro-esophageal reflux disease without esophagitis: Secondary | ICD-10-CM | POA: Diagnosis not present

## 2024-08-04 DIAGNOSIS — M818 Other osteoporosis without current pathological fracture: Secondary | ICD-10-CM | POA: Diagnosis not present

## 2024-08-04 DIAGNOSIS — I10 Essential (primary) hypertension: Secondary | ICD-10-CM | POA: Diagnosis not present

## 2024-08-04 DIAGNOSIS — Z8673 Personal history of transient ischemic attack (TIA), and cerebral infarction without residual deficits: Secondary | ICD-10-CM | POA: Diagnosis not present

## 2024-08-04 DIAGNOSIS — I5031 Acute diastolic (congestive) heart failure: Secondary | ICD-10-CM | POA: Diagnosis not present

## 2024-08-04 DIAGNOSIS — I5032 Chronic diastolic (congestive) heart failure: Secondary | ICD-10-CM | POA: Diagnosis not present

## 2024-09-22 ENCOUNTER — Encounter (HOSPITAL_COMMUNITY): Admission: RE | Admit: 2024-09-22

## 2024-09-26 ENCOUNTER — Ambulatory Visit: Admit: 2024-09-26 | Payer: Self-pay | Admitting: Optometry

## 2024-09-26 SURGERY — PHACOEMULSIFICATION, CATARACT, WITH IOL INSERTION
Anesthesia: Monitor Anesthesia Care | Laterality: Right
